# Patient Record
Sex: Female | Born: 1950
Health system: Southern US, Community
[De-identification: ages and names within clinical notes are randomized; demographics above are authoritative.]

## PROBLEM LIST (undated history)

## (undated) DIAGNOSIS — E039 Hypothyroidism, unspecified: Secondary | ICD-10-CM

## (undated) DIAGNOSIS — M722 Plantar fascial fibromatosis: Secondary | ICD-10-CM

## (undated) DIAGNOSIS — M7730 Calcaneal spur, unspecified foot: Secondary | ICD-10-CM

## (undated) DIAGNOSIS — J45909 Unspecified asthma, uncomplicated: Secondary | ICD-10-CM

## (undated) DIAGNOSIS — H269 Unspecified cataract: Secondary | ICD-10-CM

## (undated) DIAGNOSIS — M810 Age-related osteoporosis without current pathological fracture: Secondary | ICD-10-CM

## (undated) DIAGNOSIS — E785 Hyperlipidemia, unspecified: Secondary | ICD-10-CM

## (undated) DIAGNOSIS — M8580 Other specified disorders of bone density and structure, unspecified site: Secondary | ICD-10-CM

## (undated) DIAGNOSIS — L570 Actinic keratosis: Secondary | ICD-10-CM

## (undated) DIAGNOSIS — M84376A Stress fracture, unspecified foot, initial encounter for fracture: Secondary | ICD-10-CM

## (undated) HISTORY — DX: Plantar fascial fibromatosis: M72.2

## (undated) HISTORY — DX: Hypothyroidism, unspecified: E03.9

## (undated) HISTORY — PX: WISDOM TOOTH EXTRACTION: SHX21

## (undated) HISTORY — PX: COLONOSCOPY: SHX174

## (undated) HISTORY — DX: Unspecified cataract: H26.9

## (undated) HISTORY — DX: Actinic keratosis: L57.0

## (undated) HISTORY — DX: Hyperlipidemia, unspecified: E78.5

## (undated) HISTORY — DX: Stress fracture, unspecified foot, initial encounter for fracture: M84.376A

## (undated) HISTORY — DX: Calcaneal spur, unspecified foot: M77.30

## (undated) HISTORY — PX: FRACTURE SURGERY: SHX138

## (undated) HISTORY — DX: Other specified disorders of bone density and structure, unspecified site: M85.80

## (undated) HISTORY — PX: EYE SURGERY: SHX253

## (undated) HISTORY — DX: Age-related osteoporosis without current pathological fracture: M81.0

---

## 1999-01-07 ENCOUNTER — Other Ambulatory Visit: Admission: RE | Admit: 1999-01-07 | Discharge: 1999-01-07 | Payer: Self-pay | Admitting: Family Medicine

## 2001-04-23 ENCOUNTER — Other Ambulatory Visit: Admission: RE | Admit: 2001-04-23 | Discharge: 2001-04-23 | Payer: Self-pay | Admitting: Family Medicine

## 2003-02-17 ENCOUNTER — Other Ambulatory Visit: Admission: RE | Admit: 2003-02-17 | Discharge: 2003-02-17 | Payer: Self-pay | Admitting: Family Medicine

## 2003-03-06 LAB — FECAL OCCULT BLOOD, GUAIAC: Fecal Occult Blood: NEGATIVE

## 2004-02-07 ENCOUNTER — Ambulatory Visit: Payer: Self-pay | Admitting: Family Medicine

## 2004-04-05 ENCOUNTER — Ambulatory Visit: Payer: Self-pay | Admitting: Family Medicine

## 2004-05-29 ENCOUNTER — Ambulatory Visit: Payer: Self-pay | Admitting: Family Medicine

## 2004-07-11 ENCOUNTER — Ambulatory Visit: Payer: Self-pay | Admitting: Family Medicine

## 2005-01-28 ENCOUNTER — Other Ambulatory Visit: Admission: RE | Admit: 2005-01-28 | Discharge: 2005-01-28 | Payer: Self-pay | Admitting: Family Medicine

## 2005-01-28 ENCOUNTER — Encounter: Payer: Self-pay | Admitting: Family Medicine

## 2005-01-28 ENCOUNTER — Ambulatory Visit: Payer: Self-pay | Admitting: Family Medicine

## 2005-01-28 LAB — CONVERTED CEMR LAB: Pap Smear: NORMAL

## 2005-02-07 ENCOUNTER — Ambulatory Visit: Payer: Self-pay | Admitting: Family Medicine

## 2005-02-26 ENCOUNTER — Ambulatory Visit: Payer: Self-pay | Admitting: Family Medicine

## 2005-03-18 ENCOUNTER — Ambulatory Visit: Payer: Self-pay | Admitting: Family Medicine

## 2005-07-22 ENCOUNTER — Ambulatory Visit: Payer: Self-pay | Admitting: Family Medicine

## 2006-01-19 ENCOUNTER — Ambulatory Visit: Payer: Self-pay | Admitting: Family Medicine

## 2006-05-13 ENCOUNTER — Ambulatory Visit: Payer: Self-pay | Admitting: Family Medicine

## 2006-08-12 ENCOUNTER — Ambulatory Visit: Payer: Self-pay | Admitting: Family Medicine

## 2006-08-14 ENCOUNTER — Encounter (INDEPENDENT_AMBULATORY_CARE_PROVIDER_SITE_OTHER): Payer: Self-pay | Admitting: *Deleted

## 2007-01-12 ENCOUNTER — Ambulatory Visit: Payer: Self-pay | Admitting: Family Medicine

## 2007-01-12 DIAGNOSIS — E785 Hyperlipidemia, unspecified: Secondary | ICD-10-CM | POA: Insufficient documentation

## 2007-01-12 DIAGNOSIS — E039 Hypothyroidism, unspecified: Secondary | ICD-10-CM | POA: Insufficient documentation

## 2007-01-12 DIAGNOSIS — M7730 Calcaneal spur, unspecified foot: Secondary | ICD-10-CM | POA: Insufficient documentation

## 2007-01-14 ENCOUNTER — Encounter: Payer: Self-pay | Admitting: Family Medicine

## 2007-01-15 ENCOUNTER — Encounter (INDEPENDENT_AMBULATORY_CARE_PROVIDER_SITE_OTHER): Payer: Self-pay | Admitting: *Deleted

## 2007-01-18 ENCOUNTER — Telehealth: Payer: Self-pay | Admitting: Family Medicine

## 2007-01-29 ENCOUNTER — Ambulatory Visit: Payer: Self-pay | Admitting: Family Medicine

## 2007-01-29 ENCOUNTER — Other Ambulatory Visit: Admission: RE | Admit: 2007-01-29 | Discharge: 2007-01-29 | Payer: Self-pay | Admitting: Family Medicine

## 2007-01-29 ENCOUNTER — Encounter: Payer: Self-pay | Admitting: Family Medicine

## 2007-01-29 DIAGNOSIS — M8580 Other specified disorders of bone density and structure, unspecified site: Secondary | ICD-10-CM | POA: Insufficient documentation

## 2007-02-03 ENCOUNTER — Encounter (INDEPENDENT_AMBULATORY_CARE_PROVIDER_SITE_OTHER): Payer: Self-pay | Admitting: *Deleted

## 2007-02-03 LAB — CONVERTED CEMR LAB: Pap Smear: NORMAL

## 2007-02-05 ENCOUNTER — Ambulatory Visit: Payer: Self-pay | Admitting: Family Medicine

## 2007-02-09 LAB — CONVERTED CEMR LAB: Vit D, 1,25-Dihydroxy: 32 (ref 30–89)

## 2007-02-11 LAB — CONVERTED CEMR LAB
ALT: 17 units/L (ref 0–35)
AST: 25 units/L (ref 0–37)
Albumin: 3.8 g/dL (ref 3.5–5.2)
Alkaline Phosphatase: 83 units/L (ref 39–117)
BUN: 15 mg/dL (ref 6–23)
Basophils Absolute: 0 10*3/uL (ref 0.0–0.1)
Basophils Relative: 1.3 % — ABNORMAL HIGH (ref 0.0–1.0)
Bilirubin, Direct: 0.1 mg/dL (ref 0.0–0.3)
CO2: 32 meq/L (ref 19–32)
Calcium: 9.5 mg/dL (ref 8.4–10.5)
Chloride: 104 meq/L (ref 96–112)
Cholesterol: 240 mg/dL (ref 0–200)
Creatinine, Ser: 1 mg/dL (ref 0.4–1.2)
Direct LDL: 161.8 mg/dL
Eosinophils Absolute: 0 10*3/uL (ref 0.0–0.6)
Eosinophils Relative: 0.8 % (ref 0.0–5.0)
GFR calc Af Amer: 74 mL/min
GFR calc non Af Amer: 61 mL/min
Glucose, Bld: 96 mg/dL (ref 70–99)
HCT: 39.8 % (ref 36.0–46.0)
HDL: 60.1 mg/dL (ref >39.0)
Hemoglobin: 13.8 g/dL (ref 12.0–15.0)
Lymphocytes Relative: 43 % (ref 12.0–46.0)
MCHC: 34.6 g/dL (ref 30.0–36.0)
MCV: 92.1 fL (ref 78.0–100.0)
Monocytes Absolute: 0.4 10*3/uL (ref 0.2–0.7)
Monocytes Relative: 11.2 % — ABNORMAL HIGH (ref 3.0–11.0)
Neutro Abs: 1.5 10*3/uL (ref 1.4–7.7)
Neutrophils Relative %: 43.7 % (ref 43.0–77.0)
Platelets: 247 10*3/uL (ref 150–400)
Potassium: 4.4 meq/L (ref 3.5–5.1)
RBC: 4.32 M/uL (ref 3.87–5.11)
RDW: 12 % (ref 11.5–14.6)
Sodium: 141 meq/L (ref 135–145)
TSH: 2.78 microintl units/mL (ref 0.35–5.50)
Total Bilirubin: 1.3 mg/dL — ABNORMAL HIGH (ref 0.3–1.2)
Total CHOL/HDL Ratio: 4
Total Protein: 7.1 g/dL (ref 6.0–8.3)
Triglycerides: 58 mg/dL (ref 0–149)
VLDL: 12 mg/dL (ref 0–40)
WBC: 3.4 10*3/uL — ABNORMAL LOW (ref 4.5–10.5)

## 2007-03-16 ENCOUNTER — Ambulatory Visit: Payer: Self-pay | Admitting: Family Medicine

## 2007-03-18 LAB — CONVERTED CEMR LAB
Basophils Absolute: 0.2 10*3/uL — ABNORMAL HIGH (ref 0.0–0.1)
Basophils Relative: 3.3 % — ABNORMAL HIGH (ref 0.0–1.0)
Eosinophils Absolute: 0 10*3/uL (ref 0.0–0.6)
Eosinophils Relative: 0.8 % (ref 0.0–5.0)
HCT: 36.3 % (ref 36.0–46.0)
Hemoglobin: 12.6 g/dL (ref 12.0–15.0)
Lymphocytes Relative: 42.4 % (ref 12.0–46.0)
MCHC: 34.6 g/dL (ref 30.0–36.0)
MCV: 94.5 fL (ref 78.0–100.0)
Monocytes Absolute: 0.5 10*3/uL (ref 0.2–0.7)
Monocytes Relative: 10.2 % (ref 3.0–11.0)
Neutro Abs: 2 10*3/uL (ref 1.4–7.7)
Neutrophils Relative %: 43.3 % (ref 43.0–77.0)
Platelets: 226 10*3/uL (ref 150–400)
RBC: 3.84 M/uL — ABNORMAL LOW (ref 3.87–5.11)
RDW: 12.1 % (ref 11.5–14.6)
WBC: 4.6 10*3/uL (ref 4.5–10.5)

## 2007-08-25 ENCOUNTER — Telehealth: Payer: Self-pay | Admitting: Family Medicine

## 2007-09-08 ENCOUNTER — Ambulatory Visit: Payer: Self-pay | Admitting: Family Medicine

## 2007-09-08 ENCOUNTER — Encounter: Payer: Self-pay | Admitting: Family Medicine

## 2007-09-10 ENCOUNTER — Encounter (INDEPENDENT_AMBULATORY_CARE_PROVIDER_SITE_OTHER): Payer: Self-pay | Admitting: *Deleted

## 2008-10-27 ENCOUNTER — Encounter: Payer: Self-pay | Admitting: Family Medicine

## 2008-10-27 ENCOUNTER — Ambulatory Visit: Payer: Self-pay | Admitting: Family Medicine

## 2008-10-27 ENCOUNTER — Other Ambulatory Visit: Admission: RE | Admit: 2008-10-27 | Discharge: 2008-10-27 | Payer: Self-pay | Admitting: Family Medicine

## 2008-10-27 LAB — HM PAP SMEAR

## 2008-10-31 ENCOUNTER — Encounter (INDEPENDENT_AMBULATORY_CARE_PROVIDER_SITE_OTHER): Payer: Self-pay | Admitting: *Deleted

## 2008-10-31 LAB — CONVERTED CEMR LAB
ALT: 15 units/L (ref 0–35)
AST: 26 units/L (ref 0–37)
Albumin: 4.1 g/dL (ref 3.5–5.2)
Alkaline Phosphatase: 77 units/L (ref 39–117)
BUN: 9 mg/dL (ref 6–23)
Basophils Absolute: 0 10*3/uL (ref 0.0–0.1)
Basophils Relative: 0.3 % (ref 0.0–3.0)
Bilirubin, Direct: 0 mg/dL (ref 0.0–0.3)
CO2: 30 meq/L (ref 19–32)
Calcium: 9.2 mg/dL (ref 8.4–10.5)
Chloride: 102 meq/L (ref 96–112)
Cholesterol: 239 mg/dL — ABNORMAL HIGH (ref 0–200)
Creatinine, Ser: 1 mg/dL (ref 0.4–1.2)
Direct LDL: 153.6 mg/dL
Eosinophils Absolute: 0 10*3/uL (ref 0.0–0.7)
Eosinophils Relative: 0.5 % (ref 0.0–5.0)
GFR calc non Af Amer: 60.92 mL/min (ref >60)
Glucose, Bld: 71 mg/dL (ref 70–99)
HCT: 40 % (ref 36.0–46.0)
HDL: 67.2 mg/dL (ref >39.00)
Hemoglobin: 13.3 g/dL (ref 12.0–15.0)
Lymphocytes Relative: 30.9 % (ref 12.0–46.0)
Lymphs Abs: 1.4 10*3/uL (ref 0.7–4.0)
MCHC: 33.3 g/dL (ref 30.0–36.0)
MCV: 96.8 fL (ref 78.0–100.0)
Monocytes Absolute: 0.4 10*3/uL (ref 0.1–1.0)
Monocytes Relative: 9.5 % (ref 3.0–12.0)
Neutro Abs: 2.6 10*3/uL (ref 1.4–7.7)
Neutrophils Relative %: 58.8 % (ref 43.0–77.0)
Platelets: 262 10*3/uL (ref 150.0–400.0)
Potassium: 3.9 meq/L (ref 3.5–5.1)
RBC: 4.13 M/uL (ref 3.87–5.11)
RDW: 11.9 % (ref 11.5–14.6)
Sodium: 138 meq/L (ref 135–145)
TSH: 1.63 microintl units/mL (ref 0.35–5.50)
Total Bilirubin: 1.9 mg/dL — ABNORMAL HIGH (ref 0.3–1.2)
Total CHOL/HDL Ratio: 4
Total Protein: 7.3 g/dL (ref 6.0–8.3)
Triglycerides: 80 mg/dL (ref 0.0–149.0)
VLDL: 16 mg/dL (ref 0.0–40.0)
WBC: 4.4 10*3/uL — ABNORMAL LOW (ref 4.5–10.5)

## 2008-11-01 ENCOUNTER — Ambulatory Visit: Payer: Self-pay | Admitting: Family Medicine

## 2008-11-01 ENCOUNTER — Encounter: Payer: Self-pay | Admitting: Family Medicine

## 2008-11-02 ENCOUNTER — Encounter (INDEPENDENT_AMBULATORY_CARE_PROVIDER_SITE_OTHER): Payer: Self-pay | Admitting: *Deleted

## 2008-11-03 ENCOUNTER — Encounter (INDEPENDENT_AMBULATORY_CARE_PROVIDER_SITE_OTHER): Payer: Self-pay | Admitting: *Deleted

## 2009-02-07 ENCOUNTER — Encounter: Payer: Self-pay | Admitting: Family Medicine

## 2009-10-22 ENCOUNTER — Telehealth: Payer: Self-pay | Admitting: Family Medicine

## 2009-11-14 ENCOUNTER — Encounter: Payer: Self-pay | Admitting: Family Medicine

## 2009-11-14 ENCOUNTER — Ambulatory Visit: Payer: Self-pay | Admitting: Family Medicine

## 2009-11-14 LAB — HM MAMMOGRAPHY: HM Mammogram: NORMAL

## 2009-11-16 ENCOUNTER — Encounter: Payer: Self-pay | Admitting: Family Medicine

## 2010-01-22 ENCOUNTER — Telehealth: Payer: Self-pay | Admitting: Family Medicine

## 2010-04-02 ENCOUNTER — Encounter: Payer: Self-pay | Admitting: Family Medicine

## 2010-04-02 ENCOUNTER — Encounter: Payer: Self-pay | Admitting: Gastroenterology

## 2010-04-02 ENCOUNTER — Ambulatory Visit: Payer: Self-pay | Admitting: Family Medicine

## 2010-04-10 LAB — CONVERTED CEMR LAB
ALT: 14 U/L
AST: 22 U/L
Albumin: 3.9 g/dL
Alkaline Phosphatase: 87 U/L
BUN: 11 mg/dL
Basophils Absolute: 0.1 10*3/uL
Basophils Relative: 1.1 %
Bilirubin, Direct: 0.1 mg/dL
CO2: 29 meq/L
Calcium: 9.6 mg/dL
Chloride: 103 meq/L
Cholesterol: 259 mg/dL — ABNORMAL HIGH
Creatinine, Ser: 0.8 mg/dL
Direct LDL: 177.6 mg/dL
Eosinophils Absolute: 0.1 10*3/uL
Eosinophils Relative: 1.1 %
GFR calc non Af Amer: 75.15 mL/min
Glucose, Bld: 89 mg/dL
HCT: 43.3 %
HDL: 59.4 mg/dL
Hemoglobin: 14.4 g/dL
Lymphocytes Relative: 32.2 %
Lymphs Abs: 1.5 10*3/uL
MCHC: 33.3 g/dL
MCV: 95.3 fL
Monocytes Absolute: 0.4 10*3/uL
Monocytes Relative: 8.8 %
Neutro Abs: 2.7 10*3/uL
Neutrophils Relative %: 56.8 %
Platelets: 262 10*3/uL
Potassium: 4.3 meq/L
RBC: 4.54 M/uL
RDW: 12.2 %
Sodium: 140 meq/L
TSH: 1.4 u[IU]/mL
Total Bilirubin: 0.9 mg/dL
Total CHOL/HDL Ratio: 4
Total Protein: 7 g/dL
Triglycerides: 100 mg/dL
VLDL: 20 mg/dL
Vit D, 25-Hydroxy: 42 ng/mL
WBC: 4.8 10*3/uL

## 2010-05-02 ENCOUNTER — Encounter (INDEPENDENT_AMBULATORY_CARE_PROVIDER_SITE_OTHER): Payer: Self-pay | Admitting: *Deleted

## 2010-05-03 ENCOUNTER — Ambulatory Visit
Admission: RE | Admit: 2010-05-03 | Discharge: 2010-05-03 | Payer: Self-pay | Source: Home / Self Care | Attending: Gastroenterology | Admitting: Gastroenterology

## 2010-05-07 NOTE — Progress Notes (Signed)
  Phone Note Call from Patient   Caller: Patient Summary of Call: Patient needs her MmG ordered at [facility] wants an afternoon appt. Pls call her back at [phone]. Initial call taken by: Carlton Adam,  October 22, 2009 10:48 AM  Follow-up for Phone Call        will do ref for [facility] Follow-up by: Judith Part MD,  October 22, 2009 12:32 PM

## 2010-05-07 NOTE — Miscellaneous (Signed)
Summary: Mammgram update   Clinical Lists Changes  Observations: Added new observation of MAMMOGRAM: Normal (11/14/2009 15:03)

## 2010-05-07 NOTE — Progress Notes (Signed)
Summary: re: thyroid med   Phone Note Call from Patient Call back at [phone]   Caller: Patient Call For: Judith Part MD Summary of Call: Patient is about to run out of her thyroid medication and was told that she would need to make an appt before any more refills. She prefers to only have cpx done every 2 years so she doesn't want an office visit.  She is asking if she could just get labs done and get a refill on the medicaion for now. She says that it is tough right now and doesn't want to pay the copay. Please advise.  Initial call taken by: Melody Comas,  January 22, 2010 4:48 PM  Follow-up for Phone Call        unfortunately -- I do need to see her once a year to care for her hypothyroidism (visit and labs) can do full physical only as often as she wants  can refil her thyroid med for 2 mo while she thinks about it  px written on EMR for call in I understand the financial issues -- and for that reason if she wants to change to another practice - I would understand   Follow-up by: Judith Part MD,  January 22, 2010 8:39 PM  Additional Follow-up for Phone Call Additional follow up Details #1::        Left message for patient to call back. Have not called in med because I need to confirm a pharmacy.Lewanda Rife LPN  January 23, 2010 8:12 AM   Left message for patient to call back. Still need name of pharmacy med to be called to.Lewanda Rife LPN  January 23, 2010 11:06 AM     Additional Follow-up for Phone Call Additional follow up Details #2::    Advised pt, appt made for 04/02/10 for physical, medicine sent to pharmacy. Follow-up by: Lowella Petties CMA,  January 23, 2010 11:24 AM  Prescriptions: LEVOXYL 88 MCG  TABS (LEVOTHYROXINE SODIUM) one by mouth once daily Brand medically necessary #90 x 0   Entered by:   Lowella Petties CMA   Authorized by:   Judith Part MD   Signed by:   Lowella Petties CMA on 01/23/2010   Method used:   Electronically to        [facility] [address] (retail)       [address], [address]       [place], [place]  [address]       Ph: [phone]       Fax: [phone]   RxID:   [id number] LEVOXYL 88 MCG  TABS (LEVOTHYROXINE SODIUM) one by mouth once daily Brand medically necessary #90 x 0   Entered and Authorized by:   Judith Part MD   Signed by:   Lewanda Rife LPN on 34/74/2595   Method used:   Telephoned to ...       [facility] [address] (retail)       [address], [address]       [place], [place]  [address]       Ph: [phone]       Fax: [phone]   RxID:   [id number]

## 2010-05-09 NOTE — Miscellaneous (Signed)
Summary: [facility] Previsit/prep  Clinical Lists Changes  Medications: Added new medication of MOVIPREP 100 GM  SOLR (PEG-KCL-NACL-NASULF-NA ASC-C) As per prep instructions. - Signed Rx of MOVIPREP 100 GM  SOLR (PEG-KCL-NACL-NASULF-NA ASC-C) As per prep instructions.;  #1 x 0;  Signed;  Entered by: Wyona Almas RN;  Authorized by: Rachael Fee MD;  Method used: Electronically to [facility] [address], [place], [place], [place]  [address], Ph: [phone], Fax: [phone] Observations: Added new observation of ALLERGY REV: Done (05/03/2010 16:31)    Prescriptions: MOVIPREP 100 GM  SOLR (PEG-KCL-NACL-NASULF-NA ASC-C) As per prep instructions.  #1 x 0   Entered by:   Wyona Almas RN   Authorized by:   Rachael Fee MD   Signed by:   Wyona Almas RN on 05/03/2010   Method used:   Electronically to        [facility] [address] (retail)       [address], [address]       [place], [place]  [address]       Ph: [phone]       Fax: [phone]   RxID:   [id number]

## 2010-05-09 NOTE — Letter (Signed)
Summary: Pre Visit Letter Revised  [facility]  [address] [place], [place] [address]   Phone: [phone]  Fax: [phone]        04/02/2010 MRN: [id number]  Katrina [name] [address] [place], [place]  [address]             Procedure Date:  05-17-2010 9am            Direct Colon - Dr Russella Dar      Welcome to the Gastroenterology Division at [facility].    You are scheduled to see a nurse for your pre-procedure visit on 05-03-10 at 4:30pm on the 3rd floor at [facility], [address]  We ask that you try to arrive at our office 15 minutes prior to your appointment time to allow for check-in.  Please take a minute to review the attached form.  If you answer "Yes" to one or more of the questions on the first page, we ask that you call the person listed at your earliest opportunity.  If you answer "No" to all of the questions, please complete the rest of the form and bring it to your appointment.    Your nurse visit will consist of discussing your medical and surgical history, your immediate family medical history, and your medications.   If you are unable to list all of your medications on the form, please bring the medication bottles to your appointment and we will list them.  We will need to be aware of both prescribed and over the counter drugs.  We will need to know exact dosage information as well.    Please be prepared to read and sign documents such as consent forms, a financial agreement, and acknowledgement forms.  If necessary, and with your consent, a friend or relative is welcome to sit-in on the nurse visit with you.  Please bring your insurance card so that we may make a copy of it.  If your insurance requires a referral to see a specialist, please bring your referral form from your primary care physician.  No co-pay is required for this nurse visit.     If you cannot keep your appointment, please call [phone] to cancel or reschedule prior  to your appointment date.  This allows [place] the opportunity to schedule an appointment for another patient in need of care.    Thank you for choosing [facility] for your medical needs.  We appreciate the opportunity to care for you.  Please visit [place] at our website  to learn more about our practice.  Sincerely, The Gastroenterology Division

## 2010-05-09 NOTE — Assessment & Plan Note (Signed)
Summary: CPX   Vital Signs:  Patient profile:   60 year old female Height:      66.5 inches Weight:      157.25 pounds BMI:     25.09 Temp:     98 degrees F oral Pulse rate:   64 / minute Pulse rhythm:   regular BP sitting:   118 / 76  (left arm) Cuff size:   regular  Vitals Entered By: Lewanda Rife LPN (April 02, 2010 10:45 AM) CC: CPX LMP 4-5 yrs ago   History of Present Illness: here for wellness exam and to review chronic med problems had a good holiday   feeling fine no new medical issues  working a lot -- lots of stress - that equals some fatigue  is thankful to have a job   husband finally got a job    wt is up 5 lb  bp good at 118/76  lipids due  Last Lipid ProfileCholesterol: 239 (11-21-2008 11:16:46 AM)HDL:  67.20 (November 21, 2008 11:16:46 AM)LDL:  DEL (02/05/2007 9:08:00 AM)Triglycerides:  Last Liver profileSGOT:  26 (2008/11/21 11:16:46 AM)SPGT:  15 (2008-11-21 11:16:46 AM)T. Bili:  1.9 (11/21/08 11:16:46 AM)Alk Phos:  77 (Nov 21, 2008 11:16:46 AM)   hypothyroid-- is fatigued - but no other symptoms  no skin or hair changes   osteopenia on dexa 11/10 ca and D -- is good about that (work days good --other days sporatic)  last vit D level in the 30s is taking 1800 international units vit D daily  is doing core class for exercise     pap nl 7/10 last 3 paps have been neg no symptoms  no new partners  no hx of abn paps    mam 8/11 self exam - no lumps or changes  Td 03  wants to get screening colonosc   Allergies (verified): 1)  ! Codeine  Past History:  Past Surgical History: Last updated: 01/12/2007 childbirth x 4  Family History: Last updated: 11/21/08 Father: myelodysplasia, died  Mother: HTN, thyroid problems Siblings: 3 brothers, 1 sister GM gallbladder ca GF lung ca GF colon ca GM OP  Social History: Last updated: 01/12/2007 Marital Status: Married Children: 4 Occupation: [employer]  Risk  Factors: Smoking Status: never (01/12/2007)  Past Medical History: Hyperlipidemia Hypothyroidism osteopenia/stress fx in foot plantar fasciitis with heel spur  Review of Systems General:  Complains of fatigue; denies loss of appetite. Eyes:  Denies blurring and eye irritation. CV:  Denies chest pain or discomfort, lightheadness, and palpitations. Resp:  Denies cough, shortness of breath, and wheezing. GI:  Denies abdominal pain, bloody stools, change in bowel habits, indigestion, and nausea. GU:  Denies abnormal vaginal bleeding, discharge, dysuria, and urinary frequency. MS:  Denies joint pain, joint redness, and joint swelling. Derm:  Denies itching, lesion(s), poor wound healing, and rash. Neuro:  Denies headaches, numbness, and poor balance. Psych:  mood is ok . Endo:  Denies cold intolerance, excessive thirst, excessive urination, and heat intolerance. Heme:  Denies abnormal bruising and bleeding.  Physical Exam  General:  Well-developed,well-nourished,in no acute distress; alert,appropriate and cooperative throughout examination Head:  normocephalic, atraumatic, and no abnormalities observed.   Eyes:  vision grossly intact, pupils equal, pupils round, and pupils reactive to light.  no conjunctival pallor, injection or icterus  Ears:  R ear normal and L ear normal.   Nose:  no nasal discharge.   Mouth:  pharynx pink and moist.   Neck:  supple with full rom and no masses or  thyromegally, no JVD or carotid bruit  Chest Wall:  No deformities, masses, or tenderness noted. Breasts:  No mass, nodules, thickening, tenderness, bulging, retraction, inflamation, nipple discharge or skin changes noted.   Lungs:  Normal respiratory effort, chest expands symmetrically. Lungs are clear to auscultation, no crackles or wheezes. Heart:  Normal rate and regular rhythm. S1 and S2 normal without gallop, murmur, click, rub or other extra sounds. Abdomen:  Bowel sounds positive,abdomen soft and  non-tender without masses, organomegaly or hernias noted. no renal bruits  Msk:  No deformity or scoliosis noted of thoracic or lumbar spine.  no acute joint changes  Pulses:  R and L carotid,radial,femoral,dorsalis pedis and posterior tibial pulses are full and equal bilaterally Extremities:  No clubbing, cyanosis, edema, or deformity noted with normal full range of motion of all joints.   Neurologic:  sensation intact to light touch, gait normal, and DTRs symmetrical and normal.   Skin:  lentigos/ fair complexion no rash Cervical Nodes:  No lymphadenopathy noted Axillary Nodes:  No palpable lymphadenopathy Inguinal Nodes:  No significant adenopathy Psych:  normal affect, talkative and pleasant    Impression & Recommendations:  Problem # 1:  HEALTH MAINTENANCE EXAM (ICD-V70.0) Assessment Comment Only reviewed health habits including diet, exercise and skin cancer prevention reviewed health maintenance list and family history wellness labs today Orders: Venipuncture (98119) TLB-Lipid Panel (80061-LIPID) TLB-BMP (Basic Metabolic Panel-BMET) (80048-METABOL) TLB-CBC Platelet - w/Differential (85025-CBCD) TLB-Hepatic/Liver Function Pnl (80076-HEPATIC) TLB-TSH (Thyroid Stimulating Hormone) (84443-TSH) T-Vitamin D (25-Hydroxy) (14782-95621)  Problem # 2:  OSTEOPENIA (ICD-733.90) Assessment: Unchanged rev last dexa emph imp of ca and D exp imp D level on current dose  exercise -- is overall good  dexa in a year Her updated medication list for this problem includes:    Vitamin D 1000 Unit Tabs (Cholecalciferol) .Marland Kitchen... Take 1 tablet by mouth once a day    Calcium Carbonate-vitamin D 600-400 Mg-unit Tabs (Calcium carbonate-vitamin d) ..... One tablet by mouth twice a day  Orders: Venipuncture (30865) TLB-Lipid Panel (80061-LIPID) TLB-BMP (Basic Metabolic Panel-BMET) (80048-METABOL) TLB-CBC Platelet - w/Differential (85025-CBCD) TLB-Hepatic/Liver Function Pnl (80076-HEPATIC) TLB-TSH  (Thyroid Stimulating Hormone) (84443-TSH) T-Vitamin D (25-Hydroxy) (78469-62952) Specimen Handling (84132)  Problem # 3:  HYPOTHYROIDISM (ICD-244.9) Assessment: Unchanged overall no clinical change but fatigue labs today and update no change on exam Her updated medication list for this problem includes:    Levoxyl 88 Mcg Tabs (Levothyroxine sodium) ..... One by mouth once daily  Orders: Venipuncture (44010) TLB-Lipid Panel (80061-LIPID) TLB-BMP (Basic Metabolic Panel-BMET) (80048-METABOL) TLB-CBC Platelet - w/Differential (85025-CBCD) TLB-Hepatic/Liver Function Pnl (80076-HEPATIC) TLB-TSH (Thyroid Stimulating Hormone) (84443-TSH) T-Vitamin D (25-Hydroxy) (27253-66440)  Problem # 4:  HYPERLIPIDEMIA (ICD-272.4) Assessment: Unchanged diet improved -- disc low sat fat diet  lab today and update Orders: Venipuncture (34742) TLB-Lipid Panel (80061-LIPID) TLB-BMP (Basic Metabolic Panel-BMET) (80048-METABOL) TLB-CBC Platelet - w/Differential (85025-CBCD) TLB-Hepatic/Liver Function Pnl (80076-HEPATIC) TLB-TSH (Thyroid Stimulating Hormone) (84443-TSH) T-Vitamin D (25-Hydroxy) (59563-87564)  Labs Reviewed: SGOT: 26 (10/27/2008)   SGPT: 15 (10/27/2008)   HDL:67.20 (10/27/2008), 60.1 (02/05/2007)  LDL:DEL (02/05/2007)  Chol:239 (10/27/2008), 240 (02/05/2007)  Trig:80.0 (10/27/2008), 58 (02/05/2007)  Complete Medication List: 1)  Levoxyl 88 Mcg Tabs (Levothyroxine sodium) .... One by mouth once daily 2)  Multivitamins Tabs (Multiple vitamin) .... Take 1 tablet by mouth once a day 3)  Vitamin B13  .... Otc as directed. 4)  Vitamin D 1000 Unit Tabs (Cholecalciferol) .... Take 1 tablet by mouth once a day 5)  Calcium Carbonate-vitamin D 600-400 Mg-unit Tabs (Calcium  carbonate-vitamin d) .... One tablet by mouth twice a day  Other Orders: Gastroenterology Referral (GI)  Patient Instructions: 1)  labs today  2)  we will refer you for colonoscopy at check out  3)  keep working on  healthy diet and exercise 4)  be good about your calcium and D Prescriptions: LEVOXYL 88 MCG  TABS (LEVOTHYROXINE SODIUM) one by mouth once daily Brand medically necessary #90 x 3   Entered and Authorized by:   Judith Part MD   Signed by:   Judith Part MD on 04/02/2010   Method used:   Print then Give to Patient   RxID:   [id number]    Orders Added: 1)  Venipuncture [14782] 2)  TLB-Lipid Panel [80061-LIPID] 3)  TLB-BMP (Basic Metabolic Panel-BMET) [80048-METABOL] 4)  TLB-CBC Platelet - w/Differential [85025-CBCD] 5)  TLB-Hepatic/Liver Function Pnl [80076-HEPATIC] 6)  TLB-TSH (Thyroid Stimulating Hormone) [84443-TSH] 7)  T-Vitamin D (25-Hydroxy) [95621-30865] 8)  Specimen Handling [99000] 9)  Gastroenterology Referral [GI] 10)  Est. Patient 40-64 years [78469]    Current Allergies (reviewed today): ! CODEINE

## 2010-05-09 NOTE — Letter (Signed)
Summary: [facility] Instructions  [facility]  [address] [place], [place] [address]   Phone: [phone]  Fax: [phone]       Katrina [name]    07-31-1950    MRN: [id number]        Procedure Day Dorna Bloom:  Farrell Ours  05/17/10     Arrival Time:  8:00AM     Procedure Time:  9:00AM     [facility] of Procedure:                    [facility] ([facility]th [facility]                      PREPARATION FOR COLONOSCOPY WITH MOVIPREP   Starting 5 days prior to your procedure 05/12/10 do not eat nuts, seeds, popcorn, corn, beans, peas,  salads, or any raw vegetables.  Do not take any fiber supplements (e.g. Metamucil, Citrucel, and Benefiber).  THE DAY BEFORE YOUR PROCEDURE         DATE: 05/16/10  DAY: THURSDAY  1.  Drink clear liquids the entire day-NO SOLID FOOD  2.  Do not drink anything colored red or purple.  Avoid juices with pulp.  No orange juice.  3.  Drink at least 64 oz. (8 glasses) of fluid/clear liquids during the day to prevent dehydration and help the prep work efficiently.  CLEAR LIQUIDS INCLUDE: Water Jello Ice Popsicles Tea (sugar ok, no milk/cream) Powdered fruit flavored drinks Coffee (sugar ok, no milk/cream) Gatorade Juice: apple, white grape, white cranberry  Lemonade Clear bullion, consomm, broth Carbonated beverages (any kind) Strained chicken noodle soup Hard Candy                             4.  In the morning, mix first dose of MoviPrep solution:    Empty 1 Pouch A and 1 Pouch B into the disposable container    Add lukewarm drinking water to the top line of the container. Mix to dissolve    Refrigerate (mixed solution should be used within 24 hrs)  5.  Begin drinking the prep at 5:00 p.m. The MoviPrep container is divided by 4 marks.   Every 15 minutes drink the solution down to the next mark (approximately 8 oz) until the full liter is complete.   6.  Follow completed prep with 16 oz of clear liquid of your choice (Nothing red  or purple).  Continue to drink clear liquids until bedtime.  7.  Before going to bed, mix second dose of MoviPrep solution:    Empty 1 Pouch A and 1 Pouch B into the disposable container    Add lukewarm drinking water to the top line of the container. Mix to dissolve    Refrigerate  THE DAY OF YOUR PROCEDURE      DATE: 05/17/10  DAY: FRIDAY  Beginning at 4:00AM (5 hours before procedure):         1. Every 15 minutes, drink the solution down to the next mark (approx 8 oz) until the full liter is complete.  2. Follow completed prep with 16 oz. of clear liquid of your choice.    3. You may drink clear liquids until 7:00AM (2 HOURS BEFORE PROCEDURE).   MEDICATION INSTRUCTIONS  Unless otherwise instructed, you should take regular prescription medications with a small sip of water   as early as possible the morning of your  procedure.        OTHER INSTRUCTIONS  You will need a responsible adult at least 58 years of age to accompany you and drive you home.   This person must remain in the waiting room during your procedure.  Wear loose fitting clothing that is easily removed.  Leave jewelry and other valuables at home.  However, you may wish to bring a book to read or  an iPod/MP3 player to listen to music as you wait for your procedure to start.  Remove all body piercing jewelry and leave at home.  Total time from sign-in until discharge is approximately 2-3 hours.  You should go home directly after your procedure and rest.  You can resume normal activities the  day after your procedure.  The day of your procedure you should not:   Drive   Make legal decisions   Operate machinery   Drink alcohol   Return to work  You will receive specific instructions about eating, activities and medications before you leave.    The above instructions have been reviewed and explained to me by   Wyona Almas RN  May 03, 2010 4:56 PM     I fully understand and can  verbalize these instructions _____________________________ Date _________

## 2010-05-17 ENCOUNTER — Other Ambulatory Visit: Payer: Self-pay | Admitting: Gastroenterology

## 2010-12-30 ENCOUNTER — Ambulatory Visit: Payer: Self-pay | Admitting: Family Medicine

## 2010-12-31 ENCOUNTER — Encounter: Payer: Self-pay | Admitting: Family Medicine

## 2011-01-02 ENCOUNTER — Encounter: Payer: Self-pay | Admitting: *Deleted

## 2011-01-03 ENCOUNTER — Telehealth: Payer: Self-pay | Admitting: Family Medicine

## 2011-01-03 DIAGNOSIS — Z1231 Encounter for screening mammogram for malignant neoplasm of breast: Secondary | ICD-10-CM

## 2011-01-03 NOTE — Telephone Encounter (Signed)
According to chart she had her mam on 9/24? -- does she need a retroactive order for that one?

## 2011-01-03 NOTE — Telephone Encounter (Signed)
Pt need order for her yearly mammogram to [facility].... Katrina [name]

## 2011-01-09 DIAGNOSIS — Z1231 Encounter for screening mammogram for malignant neoplasm of breast: Secondary | ICD-10-CM | POA: Insufficient documentation

## 2011-01-09 NOTE — Telephone Encounter (Signed)
Pt does need an order for mammogram to go to [facility] outpatient center.  She had the mammogram on 9/24.

## 2011-01-09 NOTE — Telephone Encounter (Signed)
Will do order for mam already done 9/24

## 2011-01-09 NOTE — Telephone Encounter (Signed)
Addended by: Roxy Manns A on: 01/09/2011 11:16 AM   Modules accepted: Orders

## 2011-04-24 ENCOUNTER — Other Ambulatory Visit: Payer: Self-pay

## 2011-04-24 MED ORDER — LEVOTHYROXINE SODIUM 88 MCG PO TABS: 88.0000 ug | ORAL_TABLET | Freq: Every day | ORAL | Status: DC

## 2011-04-24 NOTE — Telephone Encounter (Signed)
I don't see a message from Monday?- I may not be looking in the right place... sched f/u with me and I will refil until then Px written for call in  Or send elect-- I [facility] for [facility] pharmacy for some reason thanks

## 2011-04-24 NOTE — Telephone Encounter (Signed)
Patient notified as instructed by telephone. Pt will call back for appt. Med sent electronically to [facility] [address]

## 2011-04-24 NOTE — Telephone Encounter (Signed)
Pt said she called Mon and spoke with triage nurse and requested refill for Levoxyl . Pt wanted to know if she needed to come in for lab test or appt with Dr Milinda Antis or if med could be sent to [facility] [address] Pt said she has 1 week of Levoxyl left. Pt can be reached at [phone] cell or [phone] home.

## 2011-04-28 ENCOUNTER — Other Ambulatory Visit: Payer: Self-pay | Admitting: Family Medicine

## 2011-04-29 ENCOUNTER — Other Ambulatory Visit: Payer: Self-pay | Admitting: *Deleted

## 2011-04-29 MED ORDER — LEVOTHYROXINE SODIUM 88 MCG PO TABS: 88.0000 ug | ORAL_TABLET | Freq: Every day | ORAL | Status: DC

## 2011-04-29 NOTE — Telephone Encounter (Signed)
[facility] [address] request refill Levoxyl 88 mcg. Was sent electronically 04/24/11 #30 x 1 to [facility] [address]

## 2011-04-30 ENCOUNTER — Other Ambulatory Visit: Payer: Self-pay | Admitting: Family Medicine

## 2011-04-30 NOTE — Telephone Encounter (Signed)
Patient called and stated that she has called our office several times to get her Rx for Levoxyl refilled, she has also been to [facility] pharmacy twice and her Rx was never called in.  I called [facility] and spoke to the pharmacist can gave Rx refill verbally.

## 2011-08-04 ENCOUNTER — Telehealth: Payer: Self-pay

## 2011-08-04 MED ORDER — LEVOTHYROXINE SODIUM 88 MCG PO TABS: 88.0000 ug | ORAL_TABLET | Freq: Every day | ORAL | Status: DC

## 2011-08-04 NOTE — Telephone Encounter (Signed)
Patient notified by telephone that rx was sent electronically to the pharmacy.

## 2011-08-04 NOTE — Telephone Encounter (Signed)
Ok - will send electronically

## 2011-08-04 NOTE — Telephone Encounter (Signed)
Pt request generic substitution for Levoxyl for 30 day prescription sent to [facility] [address] (Levoxyl on recall). Pt will discuss with Dr Milinda Antis at her CPX 08/15/11 about 90 day rx. Pt is out of medication.Pt can be reached [phone].

## 2011-08-15 ENCOUNTER — Encounter: Payer: Self-pay | Admitting: Family Medicine

## 2011-08-15 ENCOUNTER — Other Ambulatory Visit (HOSPITAL_COMMUNITY)
Admission: RE | Admit: 2011-08-15 | Discharge: 2011-08-15 | Disposition: A | Discharge status: Home / Self Care | Payer: BC Managed Care – PPO | Source: Ambulatory Visit | Attending: Family Medicine | Admitting: Family Medicine

## 2011-08-15 ENCOUNTER — Ambulatory Visit (INDEPENDENT_AMBULATORY_CARE_PROVIDER_SITE_OTHER): Payer: BC Managed Care – PPO | Admitting: Family Medicine

## 2011-08-15 VITALS — BP 108/64 | HR 60 | Temp 97.7°F | Ht 67.0 in | Wt 160.0 lb

## 2011-08-15 DIAGNOSIS — Z01419 Encounter for gynecological examination (general) (routine) without abnormal findings: Secondary | ICD-10-CM | POA: Insufficient documentation

## 2011-08-15 DIAGNOSIS — Z23 Encounter for immunization: Secondary | ICD-10-CM

## 2011-08-15 DIAGNOSIS — Z780 Asymptomatic menopausal state: Secondary | ICD-10-CM

## 2011-08-15 DIAGNOSIS — Z0000 Encounter for general adult medical examination without abnormal findings: Secondary | ICD-10-CM

## 2011-08-15 DIAGNOSIS — Z1159 Encounter for screening for other viral diseases: Secondary | ICD-10-CM | POA: Insufficient documentation

## 2011-08-15 DIAGNOSIS — Z1231 Encounter for screening mammogram for malignant neoplasm of breast: Secondary | ICD-10-CM

## 2011-08-15 DIAGNOSIS — M899 Disorder of bone, unspecified: Secondary | ICD-10-CM

## 2011-08-15 DIAGNOSIS — E785 Hyperlipidemia, unspecified: Secondary | ICD-10-CM

## 2011-08-15 DIAGNOSIS — E039 Hypothyroidism, unspecified: Secondary | ICD-10-CM

## 2011-08-15 DIAGNOSIS — M949 Disorder of cartilage, unspecified: Secondary | ICD-10-CM

## 2011-08-15 LAB — COMPREHENSIVE METABOLIC PANEL
ALT: 18 U/L (ref 0–35)
AST: 28 U/L (ref 0–37)
Albumin: 3.9 g/dL (ref 3.5–5.2)
Alkaline Phosphatase: 82 U/L (ref 39–117)
BUN: 11 mg/dL (ref 6–23)
CO2: 29 mEq/L (ref 19–32)
Calcium: 9.4 mg/dL (ref 8.4–10.5)
Chloride: 102 mEq/L (ref 96–112)
Creatinine, Ser: 0.9 mg/dL (ref 0.4–1.2)
GFR: 72.76 mL/min (ref >60.00)
Glucose, Bld: 90 mg/dL (ref 70–99)
Potassium: 4.1 mEq/L (ref 3.5–5.1)
Sodium: 140 mEq/L (ref 135–145)
Total Bilirubin: 1.1 mg/dL (ref 0.3–1.2)
Total Protein: 7.3 g/dL (ref 6.0–8.3)

## 2011-08-15 LAB — CBC WITH DIFFERENTIAL/PLATELET
Basophils Absolute: 0 10*3/uL (ref 0.0–0.1)
Basophils Relative: 1 % (ref 0.0–3.0)
Eosinophils Absolute: 0 10*3/uL (ref 0.0–0.7)
Eosinophils Relative: 0.6 % (ref 0.0–5.0)
HCT: 43 % (ref 36.0–46.0)
Hemoglobin: 14.2 g/dL (ref 12.0–15.0)
Lymphocytes Relative: 30.2 % (ref 12.0–46.0)
Lymphs Abs: 1.5 10*3/uL (ref 0.7–4.0)
MCHC: 33.1 g/dL (ref 30.0–36.0)
MCV: 94.8 fl (ref 78.0–100.0)
Monocytes Absolute: 0.5 10*3/uL (ref 0.1–1.0)
Monocytes Relative: 10.8 % (ref 3.0–12.0)
Neutro Abs: 2.9 10*3/uL (ref 1.4–7.7)
Neutrophils Relative %: 57.4 % (ref 43.0–77.0)
Platelets: 245 10*3/uL (ref 150.0–400.0)
RBC: 4.54 Mil/uL (ref 3.87–5.11)
RDW: 12.7 % (ref 11.5–14.6)
WBC: 5.1 10*3/uL (ref 4.5–10.5)

## 2011-08-15 LAB — LIPID PANEL
Cholesterol: 258 mg/dL — ABNORMAL HIGH (ref 0–200)
HDL: 64.4 mg/dL (ref >39.00)
Total CHOL/HDL Ratio: 4
Triglycerides: 95 mg/dL (ref 0.0–149.0)
VLDL: 19 mg/dL (ref 0.0–40.0)

## 2011-08-15 LAB — LDL CHOLESTEROL, DIRECT: Direct LDL: 181.2 mg/dL

## 2011-08-15 LAB — TSH: TSH: 1.24 u[IU]/mL (ref 0.35–5.50)

## 2011-08-15 MED ORDER — LEVOTHYROXINE SODIUM 88 MCG PO TABS: 88.0000 ug | ORAL_TABLET | Freq: Every day | ORAL | Status: DC

## 2011-08-15 NOTE — Assessment & Plan Note (Signed)
sched dexa -due

## 2011-08-15 NOTE — Patient Instructions (Signed)
Be sure to schedule your own colonoscopy and mammogram  Tdap vaccine today Pap today Labs today We will schedule bone density test at check out

## 2011-08-15 NOTE — Assessment & Plan Note (Signed)
Lab today Rev low sat fat diet and goals for chol control

## 2011-08-15 NOTE — Progress Notes (Signed)
Subjective:    Patient ID: Katrina [name], female    DOB: Aug 11, 1950, 61 y.o.   MRN: [id number]  HPI Here for health maintenance exam and to review chronic medical problems   She is feeling great - no problems  Became a gmother this week  Gets to help a lot    bp good  Wt is up 3 lb with bmi of 25  Pap 7/10 Due for that No problems , no bleeding   mammo 9/12- will get a reminder on that  Self exam-no lumps or changes   Colon cancer screen-has not had a colonoscopy  She almost did it in the past  Insurance would not pay for the test in the past  She has to schedule it in [place] - and will call us for ref if she needs it   Td was 03- due for Tdap due   Hypothyroid Lab Results  Component Value Date   TSH 1.40 04/02/2010   Clinically  Hyperlipidemia Lab Results  Component Value Date   CHOL 259* 04/02/2010   CHOL 239* 10/27/2008   CHOL 240* 02/05/2007   Lab Results  Component Value Date   HDL 59.40 04/02/2010   HDL 67.20 10/27/2008   HDL 13.0 02/05/2007   No results found for this basename: LDLCALC   Lab Results  Component Value Date   TRIG 100.0 04/02/2010   TRIG 80.0 10/27/2008   TRIG 58 02/05/2007   Lab Results  Component Value Date   CHOLHDL 4 04/02/2010   CHOLHDL 4 10/27/2008   CHOLHDL 4.0 CALC 02/05/2007   Lab Results  Component Value Date   LDLDIRECT 177.6 04/02/2010   LDLDIRECT 153.6 10/27/2008   LDLDIRECT 161.8 02/05/2007    Is due for labs today   Osteopenia  dexa 11/10 LS -2 T score Ca and D Needs to schedule that - [facility]  Patient Active Problem List  Diagnoses  . HYPOTHYROIDISM  . HYPERLIPIDEMIA  . HEEL SPUR  . OSTEOPENIA  . Other screening mammogram  . Routine general medical examination at a health care facility  . Gynecological examination  . Post-menopausal   Past Medical History  Diagnosis Date  . HLD (hyperlipidemia)   . Hypothyroid   . Osteopenia     stress fracture in foot  . Stress fracture of foot   . Plantar  fasciitis   . Heel spur    No past surgical history on file. History  Substance Use Topics  . Smoking status: Never Smoker   . Smokeless tobacco: Not on file  . Alcohol Use: Yes     Occasional   Family History  Problem Relation Age of Onset  . Myelodysplastic syndrome Father   . Hypertension Mother   . Thyroid disease Mother   . Cancer      GM--gallbladder  . Lung cancer      GF  . Colon cancer      GF  . Osteoporosis      GM   Allergies  Allergen Reactions  . Codeine     REACTION: nausea and vomiting   Current Outpatient Prescriptions on File Prior to Visit  Medication Sig Dispense Refill  . Calcium Carbonate-Vitamin D (CALCIUM 600+D) 600-400 MG-UNIT per tablet Take 1 tablet by mouth daily.         Review of Systems Review of Systems  Constitutional: Negative for fever, appetite change, fatigue and unexpected weight change.  Eyes: Negative for pain and visual disturbance.  Respiratory: Negative for  cough and shortness of breath.   Cardiovascular: Negative for cp or palpitations    Gastrointestinal: Negative for nausea, diarrhea and constipation.  Genitourinary: Negative for urgency and frequency.  Skin: Negative for pallor or rash   MSK no fractures recently  Neurological: Negative for weakness, light-headedness, numbness and headaches.  Hematological: Negative for adenopathy. Does not bruise/bleed easily.  Psychiatric/Behavioral: Negative for dysphoric mood. The patient is not nervous/anxious.         Objective:   Physical Exam  Constitutional: She appears well-developed and well-nourished. No distress.  HENT:  Head: Normocephalic and atraumatic.  Right Ear: External ear normal.  Left Ear: External ear normal.  Nose: Nose normal.  Mouth/Throat: Oropharynx is clear and moist.  Eyes: Conjunctivae and EOM are normal. Pupils are equal, round, and reactive to light. No scleral icterus.  Neck: Normal range of motion. Neck supple. No JVD present. Carotid bruit  is not present. No thyromegaly present.  Cardiovascular: Normal rate, regular rhythm, normal heart sounds and intact distal pulses.  Exam reveals no gallop.   Pulmonary/Chest: Effort normal and breath sounds normal. No respiratory distress. She has no wheezes.  Abdominal: Soft. Bowel sounds are normal. She exhibits no distension, no abdominal bruit and no mass. There is no tenderness.  Genitourinary: Vagina normal and uterus normal. No breast swelling, tenderness, discharge or bleeding. There is no rash or tenderness on the right labia. There is no rash or tenderness on the left labia. Uterus is not enlarged and not tender. Cervix exhibits no motion tenderness, no discharge and no friability. Right adnexum displays no mass, no tenderness and no fullness. Left adnexum displays no mass and no tenderness. No bleeding around the vagina. No vaginal discharge found.       Breast exam: No mass, nodules, thickening, tenderness, bulging, retraction, inflamation, nipple discharge or skin changes noted.  No axillary or clavicular LA.  Chaperoned exam.    Musculoskeletal: Normal range of motion. She exhibits no edema and no tenderness.  Lymphadenopathy:    She has no cervical adenopathy.  Neurological: She is alert. She has normal reflexes. No cranial nerve deficit. She exhibits normal muscle tone. Coordination normal.  Skin: Skin is warm and dry. No rash noted. No erythema. No pallor.       Solar lentigos diffusely   Psychiatric: She has a normal mood and affect.          Assessment & Plan:

## 2011-08-15 NOTE — Assessment & Plan Note (Signed)
Due for dexa Rev last one - lowest T score -2 for her LS  Rev ca and D Check D level Enc continued exercise

## 2011-08-15 NOTE — Assessment & Plan Note (Signed)
Pt will schedule own mammo in fall when due Nl breast exam today Enc to do monthly self exam

## 2011-08-15 NOTE — Assessment & Plan Note (Signed)
Reviewed health habits including diet and exercise and skin cancer prevention Also reviewed health mt list, fam hx and immunizations  Wellness labs today Tdap today

## 2011-08-15 NOTE — Assessment & Plan Note (Signed)
Exam with pap done today- unremarkable without problems

## 2011-08-15 NOTE — Assessment & Plan Note (Signed)
Had to change brand due to a manufacturing issue with what she was taking  Feels ok  Lab today and update

## 2011-08-16 LAB — VITAMIN D 25 HYDROXY (VIT D DEFICIENCY, FRACTURES): Vit D, 25-Hydroxy: 52 ng/mL (ref 30–89)

## 2011-08-21 ENCOUNTER — Telehealth: Payer: Self-pay

## 2011-08-21 NOTE — Telephone Encounter (Signed)
Patient advised as instructed via telephone. 

## 2011-08-21 NOTE — Telephone Encounter (Signed)
Left message on cell phone voicemail for patient to return call. 

## 2011-08-21 NOTE — Telephone Encounter (Signed)
Pt left v/m she received lab results in mail and TSH was low normal. Pt feeling very tired and sleepy and wondered if could change thyroid dosage. Pt seen 08/15/11. [facility] [address] is pharmacy and pt can be reached at [phone].

## 2011-08-21 NOTE — Telephone Encounter (Signed)
Her tsh was fine Lab Results  Component Value Date   TSH 1.24 08/15/2011   I would not change the dose

## 2011-08-22 ENCOUNTER — Encounter: Payer: Self-pay | Admitting: *Deleted

## 2011-09-23 ENCOUNTER — Ambulatory Visit: Payer: Self-pay | Admitting: Family Medicine

## 2011-09-23 LAB — HM DEXA SCAN

## 2011-09-24 ENCOUNTER — Encounter: Payer: Self-pay | Admitting: Family Medicine

## 2011-09-25 ENCOUNTER — Encounter: Payer: Self-pay | Admitting: Family Medicine

## 2011-09-25 ENCOUNTER — Encounter: Payer: Self-pay | Admitting: *Deleted

## 2012-05-11 ENCOUNTER — Ambulatory Visit: Payer: Self-pay | Admitting: Family Medicine

## 2012-05-12 ENCOUNTER — Encounter: Payer: Self-pay | Admitting: Family Medicine

## 2012-05-12 LAB — HM MAMMOGRAPHY: HM Mammogram: NORMAL

## 2012-05-13 ENCOUNTER — Encounter: Payer: Self-pay | Admitting: *Deleted

## 2012-05-13 ENCOUNTER — Encounter: Payer: Self-pay | Admitting: Family Medicine

## 2012-06-23 ENCOUNTER — Ambulatory Visit (INDEPENDENT_AMBULATORY_CARE_PROVIDER_SITE_OTHER): Payer: BC Managed Care – PPO | Admitting: Family Medicine

## 2012-06-23 ENCOUNTER — Encounter: Payer: Self-pay | Admitting: Family Medicine

## 2012-06-23 VITALS — BP 118/72 | HR 62 | Temp 98.4°F | Ht 67.0 in | Wt 166.5 lb

## 2012-06-23 DIAGNOSIS — E039 Hypothyroidism, unspecified: Secondary | ICD-10-CM

## 2012-06-23 NOTE — Progress Notes (Signed)
Subjective:    Patient ID: Katrina [name], female    DOB: 01/14/1951, 62 y.o.   MRN: [id number]  HPI Here for f/u of chronic medical problems   Is generally doing ok - but she does notice more sleepiness  Also her scalp itches and her feet are cold Had these symptoms when she was dx  Mood is generally ok   Lab Results  Component Value Date   TSH 1.24 08/15/2011    Is eating a healthy diet and exercising   Ears feel full at times - wants to check those   She does not want her chol checked    She was scheduled for a colonoscopy - then had to cancel it due to copay of 300$ There may be a place in [place]  D/w patient [id number] for colon cancer screening, including IFOB vs. colonoscopy.  Risks and benefits of both were discussed and patient voiced understanding.  Pt elects for: checking on price of colonosc first  Patient Active Problem List  Diagnosis  . HYPOTHYROIDISM  . HYPERLIPIDEMIA  . HEEL SPUR  . OSTEOPENIA  . Other screening mammogram  . Routine general medical examination at a health care facility  . Gynecological examination  . Post-menopausal   Past Medical History  Diagnosis Date  . HLD (hyperlipidemia)   . Hypothyroid   . Osteopenia     stress fracture in foot  . Stress fracture of foot   . Plantar fasciitis   . Heel spur    No past surgical history on file. History  Substance Use Topics  . Smoking status: Never Smoker   . Smokeless tobacco: Not on file  . Alcohol Use: Yes     Comment: Occasional   Family History  Problem Relation Age of Onset  . Myelodysplastic syndrome Father   . Hypertension Mother   . Thyroid disease Mother   . Cancer      GM--gallbladder  . Lung cancer      GF  . Colon cancer      GF  . Osteoporosis      GM   Allergies  Allergen Reactions  . Codeine     REACTION: nausea and vomiting   Current Outpatient Prescriptions on File Prior to Visit  Medication Sig Dispense Refill  . Calcium Carbonate-Vitamin D (CALCIUM  600+D) 600-400 MG-UNIT per tablet Take 1 tablet by mouth daily.      . cholecalciferol (VITAMIN D) 1000 UNITS tablet Take 1,000 Units by mouth daily.      Marland Kitchen levothyroxine (SYNTHROID, LEVOTHROID) 88 MCG tablet Take 1 tablet (88 mcg total) by mouth daily.  90 tablet  3  . Multiple Vitamin (MULTIVITAMIN) tablet Take 1 tablet by mouth daily.       No current facility-administered medications on file prior to visit.     Review of Systems Review of Systems  Constitutional: Negative for fever, appetite change,  and unexpected weight change. pos for fatigue and difficulty mt wt  Eyes: Negative for pain and visual disturbance.  Respiratory: Negative for cough and shortness of breath.   Cardiovascular: Negative for cp or palpitations   neg for edema  Gastrointestinal: Negative for nausea, diarrhea and constipation.  Genitourinary: Negative for urgency and frequency.  Skin: Negative for pallor or rash  pos for dry skin and itching of her scalp Neurological: Negative for weakness, light-headedness, numbness and headaches. neg for tremor  Hematological: Negative for adenopathy. Does not bruise/bleed easily. pos for cold intol Psychiatric/Behavioral: Negative for  dysphoric mood. The patient is not nervous/anxious.         Objective:   Physical Exam  Constitutional: She appears well-developed and well-nourished. No distress.  HENT:  Head: Normocephalic and atraumatic.  Eyes: Conjunctivae and EOM are normal. Pupils are equal, round, and reactive to light. Right eye exhibits no discharge. Left eye exhibits no discharge. No scleral icterus.  Neck: Normal range of motion. Neck supple. No JVD present. Carotid bruit is not present. No thyromegaly present.  Cardiovascular: Regular rhythm.   Pulmonary/Chest: Breath sounds normal. No respiratory distress. She has no wheezes.  Musculoskeletal: She exhibits no edema.  Lymphadenopathy:    She has no cervical adenopathy.  Neurological: She is alert. She has  normal reflexes. She displays no tremor.  Skin: Skin is warm and dry. No rash noted. No erythema. No pallor.  Psychiatric: She has a normal mood and affect.          Assessment & Plan:

## 2012-06-23 NOTE — Patient Instructions (Addendum)
tsh blood test today  Will get back to you about results  Take care of yourself with diet and exercise

## 2012-06-23 NOTE — Assessment & Plan Note (Signed)
Pt has had some decreased energy/ itching of skin and cold intolerance tsh today If it is in nl range - she may want to consider an endocrine consult

## 2012-06-24 LAB — TSH: TSH: 0.49 u[IU]/mL (ref 0.35–5.50)

## 2012-07-19 ENCOUNTER — Encounter: Payer: Self-pay | Admitting: Family Medicine

## 2012-07-19 ENCOUNTER — Ambulatory Visit (INDEPENDENT_AMBULATORY_CARE_PROVIDER_SITE_OTHER): Payer: BC Managed Care – PPO | Admitting: Family Medicine

## 2012-07-19 ENCOUNTER — Telehealth: Payer: Self-pay | Admitting: Family Medicine

## 2012-07-19 VITALS — BP 106/60 | HR 88 | Temp 99.2°F | Wt 162.2 lb

## 2012-07-19 DIAGNOSIS — R509 Fever, unspecified: Secondary | ICD-10-CM

## 2012-07-19 DIAGNOSIS — J020 Streptococcal pharyngitis: Secondary | ICD-10-CM

## 2012-07-19 DIAGNOSIS — J029 Acute pharyngitis, unspecified: Secondary | ICD-10-CM

## 2012-07-19 LAB — POCT INFLUENZA A/B
Influenza A, POC: NEGATIVE
Influenza B, POC: NEGATIVE

## 2012-07-19 LAB — POCT RAPID STREP A (OFFICE): Rapid Strep A Screen: POSITIVE — AB

## 2012-07-19 MED ORDER — AMOXICILLIN 875 MG PO TABS: 875.0000 mg | ORAL_TABLET | Freq: Two times a day (BID) | ORAL | Status: DC

## 2012-07-19 NOTE — Telephone Encounter (Signed)
appt scheduled with Dr. Reece Agar this afternoon

## 2012-07-19 NOTE — Telephone Encounter (Signed)
Patient Information:  Caller Name: Seleta  Phone: [phone]  Patient: Peggie, Hornak  Gender: Female  DOB: [birth year]  Age: 62 Years  PCP: [facility] (Family Practice)  Office Follow Up:  Does the office need to follow up with this patient?: Yes  Instructions For The Office: Pt uses [facility] in [place]   Symptoms  Reason For Call & Symptoms: Pt is calling to say she was exposed to Type B influenza and strep throat 2 weeks ago. Pt started with her sx yesterday. Pt is having fever/chills/body aches and pains/h/a and sore throat.  Reviewed Health History In EMR: Yes  Reviewed Medications In EMR: Yes  Reviewed Allergies In EMR: Yes  Reviewed Surgeries / Procedures: Yes  Date of Onset of Symptoms: 07/18/2012  Any Fever: Yes  Fever Taken: Oral  Fever Time Of Reading: 17:00:00  Fever Last Reading: 101  Guideline(s) Used:  Influenza Exposure  Influenza - Seasonal  Disposition Per Guideline:   Discuss with PCP and Callback by Nurse Today  Reason For Disposition Reached:   Patient requests antiviral medicine for influenza and flu symptoms present < 48 hours  Advice Given:  N/A  Patient Will Follow Care Advice:  YES

## 2012-07-19 NOTE — Telephone Encounter (Signed)
If she was exp to both strep and flu at the same time - needs to be seen so we do not treat for the wrong thing-please schedule with first avail

## 2012-07-19 NOTE — Patient Instructions (Addendum)
You have strep pharyngitis. Treat with amoxicillin twice daily for 10 days. Push fluids and plenty of rest. May use ibuprofen for throat inflammation. Salt water gargles. Suck on cold things like popsicles or warm things like herbal teas (whichever soothes the throat better). Return if fever >101.5, worsening pain, or trouble opening/closing mouth, or hoarse voice. Back to school once fever free for 24 hours (usually 24 hours after starting antibiotic). Good to see you today, call clinic with questions.  Strep Throat Strep throat is an infection of the throat caused by a bacteria named Streptococcus pyogenes. Your caregiver may call the infection streptococcal "tonsillitis" or "pharyngitis" depending on whether there are signs of inflammation in the tonsils or back of the throat. Strep throat is most common in children from 36 to 25 years old during the cold months of the year, but it can occur in people of any age during any season. This infection is spread from person to person (contagious) through coughing, sneezing, or other close contact. SYMPTOMS   Fever or chills.  Painful, swollen, red tonsils or throat.  Pain or difficulty when swallowing.  White or yellow spots on the tonsils or throat.  Swollen, tender lymph nodes or "glands" of the neck or under the jaw.  Red rash all over the body (rare). DIAGNOSIS  Many different infections can cause the same symptoms. A test must be done to confirm the diagnosis so the right treatment can be given. A "rapid strep test" can help your caregiver make the diagnosis in a few minutes. If this test is not available, a light swab of the infected area can be used for a throat culture test. If a throat culture test is done, results are usually available in a day or two. TREATMENT  Strep throat is treated with antibiotic medicine. HOME CARE INSTRUCTIONS   Gargle with 1 tsp of salt in 1 cup of warm water, 3 to 4 times per day or as needed for  comfort.  Family members who also have a sore throat or fever should be tested for strep throat and treated with antibiotics if they have the strep infection.  Make sure everyone in your household washes their hands well.  Do not share food, drinking cups, or personal items that could cause the infection to spread to others.  You may need to eat a soft food diet until your sore throat gets better.  Drink enough water and fluids to keep your urine clear or pale yellow. This will help prevent dehydration.  Get plenty of rest.  Stay home from school, daycare, or work until you have been on antibiotics for 24 hours.  Only take over-the-counter or prescription medicines for pain, discomfort, or fever as directed by your caregiver.  If antibiotics are prescribed, take them as directed. Finish them even if you start to feel better. SEEK MEDICAL CARE IF:   The glands in your neck continue to enlarge.  You develop a rash, cough, or earache.  You cough up green, yellow-brown, or bloody sputum.  You have pain or discomfort not controlled by medicines.  Your problems seem to be getting worse rather than better. SEEK IMMEDIATE MEDICAL CARE IF:   You develop any new symptoms such as vomiting, severe headache, stiff or painful neck, chest pain, shortness of breath, or trouble swallowing.  You develop severe throat pain, drooling, or changes in your voice.  You develop swelling of the neck, or the skin on the neck becomes red and tender.  You have a fever.  You develop signs of dehydration, such as fatigue, dry mouth, and decreased urination.  You become increasingly sleepy, or you cannot wake up completely. Document Released: 03/21/2000 Document Revised: 06/16/2011 Document Reviewed: 05/23/2010 [facility] Patient Information 2013 [place], [place].

## 2012-07-19 NOTE — Progress Notes (Signed)
  Subjective:    Patient ID: Katrina [name], female    DOB: 06-Mar-1951, 62 y.o.   MRN: [id number]  HPI CC: flu?  Yesterday started feeling ill.  Fever, chills, ST, headache, bodys aches.  L earache.  Mild nausea.  So far has been taking day and night cold OTC meds.  No coughing, congestion, rhinorrhea.  No abd pain, vomiting.  Has been exposed recently to type B flu and strep throat 2 weeks ago. No h/o asthma. No smokers at home. Did receive flu shot in November.  Past Medical History  Diagnosis Date  . HLD (hyperlipidemia)   . Hypothyroid   . Osteopenia     stress fracture in foot  . Stress fracture of foot   . Plantar fasciitis   . Heel spur      Review of Systems Per HPI    Objective:   Physical Exam  Nursing note and vitals reviewed. Constitutional: She appears well-developed and well-nourished. No distress.  HENT:  Head: Normocephalic and atraumatic.  Right Ear: Hearing, tympanic membrane, external ear and ear canal normal.  Left Ear: Hearing, tympanic membrane, external ear and ear canal normal.  Nose: No mucosal edema or rhinorrhea. Right sinus exhibits no maxillary sinus tenderness and no frontal sinus tenderness. Left sinus exhibits no maxillary sinus tenderness and no frontal sinus tenderness.  Mouth/Throat: Uvula is midline and mucous membranes are normal. Oropharyngeal exudate, posterior oropharyngeal edema and posterior oropharyngeal erythema present. No tonsillar abscesses.  Tonsillar exudates present bilaterally  Eyes: Conjunctivae and EOM are normal. Pupils are equal, round, and reactive to light. No scleral icterus.  Neck: Normal range of motion. Neck supple.  Cardiovascular: Normal rate, regular rhythm, normal heart sounds and intact distal pulses.   No murmur heard. Pulmonary/Chest: Effort normal and breath sounds normal. No respiratory distress. She has no wheezes. She has no rales.  Lymphadenopathy:    She has cervical adenopathy (mild L AC LAD).   Skin: Skin is warm and dry. No rash noted.  Psychiatric: She has a normal mood and affect.       Assessment & Plan:

## 2012-07-19 NOTE — Assessment & Plan Note (Addendum)
4/4 centor criteria - checked RST today - positive. Given flu exposure, also checked flu nasal swab - negative. Treat with amoxicillin x 10 days. Update [place] if sxs persist after treatment. Discussed red flags to monitor. Pt agrees with plan.

## 2012-07-19 NOTE — Addendum Note (Signed)
Addended by: Josph Macho A on: 07/19/2012 04:18 PM   Modules accepted: Orders

## 2012-09-19 ENCOUNTER — Other Ambulatory Visit: Payer: Self-pay | Admitting: Family Medicine

## 2012-09-20 ENCOUNTER — Other Ambulatory Visit: Payer: Self-pay

## 2012-09-20 NOTE — Telephone Encounter (Signed)
Pt left v/m requesting refill levothyroxine to [facility] [address]. Advised pt ref[facility] to [facility] and pt will ck with pharmacy.

## 2013-03-21 ENCOUNTER — Other Ambulatory Visit: Payer: Self-pay

## 2013-03-21 MED ORDER — LEVOTHYROXINE SODIUM 88 MCG PO TABS: ORAL_TABLET | ORAL | Status: DC

## 2013-03-21 NOTE — Telephone Encounter (Signed)
Pt request 30 day refill levothyroxine 88 mcg to [facility] [address]. Pt already has med refill appt scheduled 03/28/13. Advised pt done.

## 2013-03-25 ENCOUNTER — Ambulatory Visit: Payer: BC Managed Care – PPO | Admitting: Family Medicine

## 2013-03-28 ENCOUNTER — Ambulatory Visit (INDEPENDENT_AMBULATORY_CARE_PROVIDER_SITE_OTHER): Payer: BC Managed Care – PPO | Admitting: Family Medicine

## 2013-03-28 ENCOUNTER — Encounter: Payer: Self-pay | Admitting: Family Medicine

## 2013-03-28 VITALS — BP 108/64 | HR 50 | Temp 97.5°F | Ht 67.0 in | Wt 160.2 lb

## 2013-03-28 DIAGNOSIS — E039 Hypothyroidism, unspecified: Secondary | ICD-10-CM

## 2013-03-28 DIAGNOSIS — M899 Disorder of bone, unspecified: Secondary | ICD-10-CM

## 2013-03-28 DIAGNOSIS — Z1211 Encounter for screening for malignant neoplasm of colon: Secondary | ICD-10-CM | POA: Insufficient documentation

## 2013-03-28 DIAGNOSIS — E785 Hyperlipidemia, unspecified: Secondary | ICD-10-CM

## 2013-03-28 LAB — COMPREHENSIVE METABOLIC PANEL
ALT: 18 U/L (ref 0–35)
AST: 25 U/L (ref 0–37)
Albumin: 3.9 g/dL (ref 3.5–5.2)
Alkaline Phosphatase: 82 U/L (ref 39–117)
BUN: 10 mg/dL (ref 6–23)
CO2: 28 mEq/L (ref 19–32)
Calcium: 9.1 mg/dL (ref 8.4–10.5)
Chloride: 102 mEq/L (ref 96–112)
Creatinine, Ser: 1 mg/dL (ref 0.4–1.2)
GFR: 61.4 mL/min (ref >60.00)
Glucose, Bld: 97 mg/dL (ref 70–99)
Potassium: 4.1 mEq/L (ref 3.5–5.1)
Sodium: 136 mEq/L (ref 135–145)
Total Bilirubin: 1.3 mg/dL — ABNORMAL HIGH (ref 0.3–1.2)
Total Protein: 7 g/dL (ref 6.0–8.3)

## 2013-03-28 LAB — LIPID PANEL
Cholesterol: 249 mg/dL — ABNORMAL HIGH (ref 0–200)
HDL: 56.5 mg/dL (ref >39.00)
Total CHOL/HDL Ratio: 4
Triglycerides: 92 mg/dL (ref 0.0–149.0)
VLDL: 18.4 mg/dL (ref 0.0–40.0)

## 2013-03-28 LAB — TSH: TSH: 2.34 u[IU]/mL (ref 0.35–5.50)

## 2013-03-28 LAB — LDL CHOLESTEROL, DIRECT: Direct LDL: 184.1 mg/dL

## 2013-03-28 MED ORDER — LEVOTHYROXINE SODIUM 88 MCG PO TABS: ORAL_TABLET | ORAL | Status: DC

## 2013-03-28 NOTE — Assessment & Plan Note (Signed)
ifob card given Pt declines colonoscopy   

## 2013-03-28 NOTE — Patient Instructions (Signed)
If you are interested in a shingles/zoster vaccine - call your insurance to check on coverage,( you should not get it within 1 month of other vaccines) , then call us for a prescription  for it to take to a pharmacy that gives the shot , or make a nurse visit to get it here depending on your coverage Labs today Avoid red meat/ fried foods/ egg yolks/ fatty breakfast meats/ butter, cheese and high fat dairy/ and shellfish   Please do stool card for colon cancer screening

## 2013-03-28 NOTE — Assessment & Plan Note (Signed)
Vit D level today  utd dexa  No fx  Disc need for calcium/ vitamin D/ wt bearing exercise and bone density test every 2 y to monitor Disc safety/ fracture risk in detail

## 2013-03-28 NOTE — Assessment & Plan Note (Signed)
Lipids have been high in the past  Pt declines medication Disc goals for lipids and reasons to control them Rev low sat fat diet in detail

## 2013-03-28 NOTE — Progress Notes (Signed)
Subjective:    Patient ID: Katrina [name], female    DOB: 03-10-1951, 62 y.o.   MRN: [id number]  HPI Here for f/u of chronic medical problems   She is doing well  Still exercises and drinks water   Had her flu vaccine at school   Hypothyroid Hypothyroidism  Pt has no clinical changes No change in energy level/ hair or skin/ edema and no tremor She was more tired at last visit - and then she increased her protein and that helped  Lab Results  Component Value Date   TSH 0.49 06/23/2012    She is on generic - levothyroxine  No goiter    Hyperlipidemia Lab Results  Component Value Date   CHOL 258* 08/15/2011   HDL 64.40 08/15/2011   LDLDIRECT 181.2 08/15/2011   TRIG 95.0 08/15/2011   CHOLHDL 4 08/15/2011   she wants to check this today  Is eating lean protein - fruit and veg  Mother has high cholesterol - it runs in her family  If cholesterol  She does not want to take med   Patient Active Problem List   Diagnosis Date Noted  . Colon cancer screening 03/28/2013  . Strep throat 07/19/2012  . Routine general medical examination at a health care facility 08/15/2011  . Gynecological examination 08/15/2011  . Post-menopausal 08/15/2011  . Other screening mammogram 01/09/2011  . OSTEOPENIA 01/29/2007  . HYPOTHYROIDISM 01/12/2007  . HYPERLIPIDEMIA 01/12/2007  . HEEL SPUR 01/12/2007   Past Medical History  Diagnosis Date  . HLD (hyperlipidemia)   . Hypothyroid   . Osteopenia     stress fracture in foot  . Stress fracture of foot   . Plantar fasciitis   . Heel spur    No past surgical history on file. History  Substance Use Topics  . Smoking status: Never Smoker   . Smokeless tobacco: Not on file  . Alcohol Use: Yes     Comment: Occasional   Family History  Problem Relation Age of Onset  . Myelodysplastic syndrome Father   . Hypertension Mother   . Thyroid disease Mother   . Cancer      GM--gallbladder  . Lung cancer      GF  . Colon cancer      GF  .  Osteoporosis      GM   Allergies  Allergen Reactions  . Acetaminophen Other (See Comments)    "hyper"  . Codeine     REACTION: nausea and vomiting   Current Outpatient Prescriptions on File Prior to Visit  Medication Sig Dispense Refill  . Calcium Carbonate-Vitamin D (CALCIUM 600+D) 600-400 MG-UNIT per tablet Take 1 tablet by mouth daily.      . cholecalciferol (VITAMIN D) 1000 UNITS tablet Take 1,000 Units by mouth daily.      . Multiple Vitamin (MULTIVITAMIN) tablet Take 1 tablet by mouth daily.       No current facility-administered medications on file prior to visit.    Review of Systems Review of Systems  Constitutional: Negative for fever, appetite change, fatigue and unexpected weight change.  Eyes: Negative for pain and visual disturbance.  Respiratory: Negative for cough and shortness of breath.   Cardiovascular: Negative for cp or palpitations    Gastrointestinal: Negative for nausea, diarrhea and constipation.  Genitourinary: Negative for urgency and frequency.  Skin: Negative for pallor or rash   Neurological: Negative for weakness, light-headedness, numbness and headaches.  Hematological: Negative for adenopathy. Does not bruise/bleed easily.  Psychiatric/Behavioral: Negative for dysphoric mood. The patient is not nervous/anxious.         Objective:   Physical Exam  Constitutional: She appears well-developed and well-nourished. No distress.  HENT:  Head: Normocephalic and atraumatic.  Nose: Nose normal.  Mouth/Throat: Oropharynx is clear and moist.  Eyes: Conjunctivae and EOM are normal. Pupils are equal, round, and reactive to light. Right eye exhibits no discharge. Left eye exhibits no discharge. No scleral icterus.  Neck: Normal range of motion. Neck supple. No JVD present. No thyromegaly present.  Cardiovascular: Normal rate, regular rhythm, normal heart sounds and intact distal pulses.  Exam reveals no gallop.   Pulmonary/Chest: Effort normal and breath  sounds normal. No respiratory distress. She has no wheezes. She has no rales.  Abdominal: Soft. Bowel sounds are normal. She exhibits no distension and no mass. There is no tenderness.  Musculoskeletal: She exhibits no edema and no tenderness.  Lymphadenopathy:    She has no cervical adenopathy.  Neurological: She is alert. She has normal reflexes. No cranial nerve deficit. She exhibits normal muscle tone. Coordination normal.  Skin: Skin is warm and dry. No rash noted. No erythema. No pallor.  Psychiatric: She has a normal mood and affect.          Assessment & Plan:

## 2013-03-28 NOTE — Progress Notes (Signed)
Pre-visit discussion using our clinic review tool. No additional management support is needed unless otherwise documented below in the visit note.  

## 2013-03-28 NOTE — Assessment & Plan Note (Signed)
No clinical changes  tsh today  Will refill dose if appropriate

## 2013-03-29 ENCOUNTER — Encounter: Payer: Self-pay | Admitting: *Deleted

## 2013-03-29 LAB — VITAMIN D 25 HYDROXY (VIT D DEFICIENCY, FRACTURES): Vit D, 25-Hydroxy: 38 ng/mL (ref 30–89)

## 2013-04-01 ENCOUNTER — Encounter: Payer: Self-pay | Admitting: *Deleted

## 2013-04-08 ENCOUNTER — Telehealth: Payer: Self-pay

## 2013-04-08 NOTE — Telephone Encounter (Signed)
Pt received letter about recent lab results and pt rquest cb about Vit D level.

## 2013-04-12 NOTE — Telephone Encounter (Signed)
Left detailed message on voicemail to return call and leave her question on triage VM and we can get an answer to her more efficiently.

## 2013-04-18 NOTE — Telephone Encounter (Signed)
Left v/m requesting pt to cb with needed information.

## 2013-04-18 NOTE — Telephone Encounter (Signed)
That is fine -it is not very low just a little lower than last time-go ahead and increase it for a month

## 2013-04-18 NOTE — Telephone Encounter (Signed)
Pt left v/m pt is at work and request cb and leave detailed message [phone] re: since pts vit D levels were down in lab results. Pt wants to know if should double her Vit D for one month to help elevate Vit D level.Please advise.

## 2013-04-19 NOTE — Telephone Encounter (Signed)
Pt notified it's okay to double her vit D for 1 month. Pt notified level wasn't to low

## 2013-06-09 ENCOUNTER — Ambulatory Visit: Payer: Self-pay | Admitting: Family Medicine

## 2013-06-10 ENCOUNTER — Encounter: Payer: Self-pay | Admitting: Family Medicine

## 2013-06-13 ENCOUNTER — Encounter: Payer: Self-pay | Admitting: *Deleted

## 2014-04-09 ENCOUNTER — Other Ambulatory Visit: Payer: Self-pay | Admitting: Family Medicine

## 2014-04-17 ENCOUNTER — Ambulatory Visit: Payer: BC Managed Care – PPO | Admitting: Family Medicine

## 2014-04-25 ENCOUNTER — Encounter: Payer: Self-pay | Admitting: Family Medicine

## 2014-04-25 ENCOUNTER — Ambulatory Visit (INDEPENDENT_AMBULATORY_CARE_PROVIDER_SITE_OTHER): Payer: BC Managed Care – PPO | Admitting: Family Medicine

## 2014-04-25 VITALS — BP 106/68 | HR 54 | Temp 97.3°F | Ht 67.0 in | Wt 162.5 lb

## 2014-04-25 DIAGNOSIS — M8580 Other specified disorders of bone density and structure, unspecified site: Secondary | ICD-10-CM

## 2014-04-25 DIAGNOSIS — E785 Hyperlipidemia, unspecified: Secondary | ICD-10-CM

## 2014-04-25 DIAGNOSIS — E039 Hypothyroidism, unspecified: Secondary | ICD-10-CM

## 2014-04-25 LAB — LIPID PANEL
Cholesterol: 241 mg/dL — ABNORMAL HIGH (ref 0–200)
HDL: 60.8 mg/dL (ref >39.00)
LDL Cholesterol: 159 mg/dL — ABNORMAL HIGH (ref 0–99)
NonHDL: 180.2
Total CHOL/HDL Ratio: 4
Triglycerides: 106 mg/dL (ref 0.0–149.0)
VLDL: 21.2 mg/dL (ref 0.0–40.0)

## 2014-04-25 LAB — TSH: TSH: 1.75 u[IU]/mL (ref 0.35–4.50)

## 2014-04-25 MED ORDER — LEVOTHYROXINE SODIUM 88 MCG PO TABS: 88.0000 ug | ORAL_TABLET | Freq: Every day | ORAL | Status: DC

## 2014-04-25 NOTE — Progress Notes (Signed)
Subjective:    Patient ID: Katrina [name], female    DOB: 1951-03-23, 64 y.o.   MRN: [id number]  HPI Here for f/u of her chronic medical conditions   No change   Wt is up 2 lb with bmi of 25  Wants to loose some pain and shape up  Exercise - she likes to do classes at [employer] - toning/ sculpting and balance  Thinking about riding a bike   Had a flu shot in nov  Hypothyroidism  Pt has no clinical changes No change in energy level/ hair or skin/ edema and no tremor Lab Results  Component Value Date   TSH 2.34 03/28/2013     Has high cholesterol  Declines all cholesterol med  Just got back from the beach- diet was   Osteopenia - dexa was 6/13 Does not want to schedule that yet No falls or fx  Great exercise  On ca and D  Patient Active Problem List   Diagnosis Date Noted  . Colon cancer screening 03/28/2013  . Routine general medical examination at a health care facility 08/15/2011  . Gynecological examination 08/15/2011  . Post-menopausal 08/15/2011  . Other screening mammogram 01/09/2011  . Osteopenia 01/29/2007  . Hypothyroidism 01/12/2007  . Hyperlipidemia 01/12/2007  . HEEL SPUR 01/12/2007   Past Medical History  Diagnosis Date  . HLD (hyperlipidemia)   . Hypothyroid   . Osteopenia     stress fracture in foot  . Stress fracture of foot   . Plantar fasciitis   . Heel spur    No past surgical history on file. History  Substance Use Topics  . Smoking status: Never Smoker   . Smokeless tobacco: Not on file  . Alcohol Use: Yes     Comment: Occasional   Family History  Problem Relation Age of Onset  . Myelodysplastic syndrome Father   . Hypertension Mother   . Thyroid disease Mother   . Cancer      GM--gallbladder  . Lung cancer      GF  . Colon cancer      GF  . Osteoporosis      GM   Allergies  Allergen Reactions  . Acetaminophen Other (See Comments)    "hyper"  . Codeine     REACTION: nausea and vomiting   Current Outpatient  Prescriptions on File Prior to Visit  Medication Sig Dispense Refill  . Calcium Carbonate-Vitamin D (CALCIUM 600+D) 600-400 MG-UNIT per tablet Take 1 tablet by mouth daily.    . cholecalciferol (VITAMIN D) 1000 UNITS tablet Take 1,000 Units by mouth daily.    . Multiple Vitamin (MULTIVITAMIN) tablet Take 1 tablet by mouth daily.     No current facility-administered medications on file prior to visit.     Review of Systems Review of Systems  Constitutional: Negative for fever, appetite change, fatigue and unexpected weight change.  Eyes: Negative for pain and visual disturbance.  Respiratory: Negative for cough and shortness of breath.   Cardiovascular: Negative for cp or palpitations    Gastrointestinal: Negative for nausea, diarrhea and constipation.  Genitourinary: Negative for urgency and frequency.  Skin: Negative for pallor or rash   Neurological: Negative for weakness, light-headedness, numbness and headaches.  Hematological: Negative for adenopathy. Does not bruise/bleed easily.  Psychiatric/Behavioral: Negative for dysphoric mood. The patient is not nervous/anxious.         Objective:   Physical Exam  Constitutional: She appears well-developed and well-nourished. No distress.  HENT:  Head: Normocephalic and atraumatic.  Right Ear: External ear normal.  Left Ear: External ear normal.  Nose: Nose normal.  Mouth/Throat: Oropharynx is clear and moist.  Eyes: Conjunctivae and EOM are normal. Pupils are equal, round, and reactive to light. Right eye exhibits no discharge. Left eye exhibits no discharge. No scleral icterus.  Neck: Normal range of motion. Neck supple. No JVD present. Carotid bruit is not present. No thyromegaly present.  Cardiovascular: Normal rate, regular rhythm, normal heart sounds and intact distal pulses.  Exam reveals no gallop.   Pulmonary/Chest: Effort normal and breath sounds normal. No respiratory distress. She has no wheezes. She has no rales.    Abdominal: Soft. Bowel sounds are normal. She exhibits no distension, no abdominal bruit and no mass. There is no tenderness.  Musculoskeletal: She exhibits no edema or tenderness.  Lymphadenopathy:    She has no cervical adenopathy.  Neurological: She is alert. She has normal reflexes. No cranial nerve deficit. She exhibits normal muscle tone. Coordination normal.  Skin: Skin is warm and dry. No rash noted. No erythema. No pallor.  Psychiatric: She has a normal mood and affect.          Assessment & Plan:   Problem List Items Addressed This Visit      Endocrine   Hypothyroidism - Primary    tsh today  No clinical changes  Renewed levothyroxine  No change in exam      Relevant Medications   levothyroxine (SYNTHROID, LEVOTHROID) tablet   Other Relevant Orders   TSH (Completed)     Musculoskeletal and Integument   Osteopenia    Disc need for calcium/ vitamin D/ wt bearing exercise and bone density test every 2 y to monitor Disc safety/ fracture risk in detail  Pt declines dexa at this time -wants to put it off  No falls or fx         Other   Hyperlipidemia    Lipid panel today  Disc goals for lipids and reasons to control them Rev labs with pt from last check  Rev low sat fat diet in detail  She would like to avoid medication  Admits diet was not good on her recent vacation       Relevant Orders   Lipid panel (Completed)

## 2014-04-25 NOTE — Patient Instructions (Signed)
Labs today  Make your annual exam a year from now  Take your calcium and vitamin D  Keep exercising  For cholesterol   Avoid red meat/ fried foods/ egg yolks/ fatty breakfast meats/ butter, cheese and high fat dairy/ and shellfish

## 2014-04-25 NOTE — Progress Notes (Signed)
Pre visit review using our clinic review tool, if applicable. No additional management support is needed unless otherwise documented below in the visit note. 

## 2014-04-26 ENCOUNTER — Encounter: Payer: Self-pay | Admitting: *Deleted

## 2014-04-28 NOTE — Assessment & Plan Note (Signed)
tsh today  No clinical changes  Renewed levothyroxine  No change in exam

## 2014-04-28 NOTE — Assessment & Plan Note (Signed)
Lipid panel today  Disc goals for lipids and reasons to control them Rev labs with pt from last check  Rev low sat fat diet in detail  She would like to avoid medication  Admits diet was not good on her recent vacation

## 2014-04-28 NOTE — Assessment & Plan Note (Signed)
Disc need for calcium/ vitamin D/ wt bearing exercise and bone density test every 2 y to monitor Disc safety/ fracture risk in detail  Pt declines dexa at this time -wants to put it off  No falls or fx

## 2014-07-24 ENCOUNTER — Encounter: Payer: BC Managed Care – PPO | Admitting: Family Medicine

## 2015-04-13 ENCOUNTER — Other Ambulatory Visit: Payer: Self-pay | Admitting: Family Medicine

## 2015-04-13 DIAGNOSIS — Z1231 Encounter for screening mammogram for malignant neoplasm of breast: Secondary | ICD-10-CM

## 2015-04-18 ENCOUNTER — Ambulatory Visit
Admission: RE | Admit: 2015-04-18 | Discharge: 2015-04-18 | Disposition: A | Discharge status: Home / Self Care | Payer: BC Managed Care – PPO | Source: Ambulatory Visit | Attending: Family Medicine | Admitting: Family Medicine

## 2015-04-18 DIAGNOSIS — Z1231 Encounter for screening mammogram for malignant neoplasm of breast: Secondary | ICD-10-CM

## 2015-04-18 LAB — HM MAMMOGRAPHY: HM Mammogram: NORMAL

## 2015-04-19 ENCOUNTER — Encounter: Payer: Self-pay | Admitting: *Deleted

## 2015-04-19 ENCOUNTER — Encounter: Payer: Self-pay | Admitting: Family Medicine

## 2015-07-12 ENCOUNTER — Other Ambulatory Visit: Payer: Self-pay | Admitting: Family Medicine

## 2015-07-23 ENCOUNTER — Encounter: Payer: BC Managed Care – PPO | Admitting: Family Medicine

## 2015-08-06 ENCOUNTER — Telehealth: Payer: Self-pay | Admitting: Family Medicine

## 2015-08-06 NOTE — Telephone Encounter (Signed)
Patient Name: Katrina [name] DOB: 02-07-1951 Initial Comment Caller states, donated blood yesterday, blood pressure was 84/60. Also has extremely tired. Wants to sleep all the time. Nurse Assessment Nurse: Ronnald Ramp, RN, Miranda Date/Time (Eastern Time): 08/06/2015 10:26:56 AM Confirm and document reason for call. If symptomatic, describe symptoms. You must click the next button to save text entered. ---Caller states yesterday she tried to give blood but her BP was too low, it was 84/60 but it was up once she went home. BP was 110/70 (normal BP is 90/60). She has had fatigue for > 5 months. Has the patient traveled out of the country within the last 30 days? ---Not Applicable Does the patient have any new or worsening symptoms? ---Yes Will a triage be completed? ---Yes Related visit to physician within the last 2 weeks? ---No Does the PT have any chronic conditions? (i.e. diabetes, asthma, etc.) ---Yes List chronic conditions. ---Thyroid Is this a behavioral health or substance abuse call? ---No Guidelines Guideline Title Affirmed Question Affirmed Notes Low Blood Pressure [id number] Systolic BP [id number] AND [id number] taking blood pressure medications AND [3] NOT dizzy, lightheaded or weak Final Disposition User See Physician within 24 Hours Ronnald Ramp, RN, Miranda Comments Appt scheduled for tomorrow 5/2 at 2:45pm with Dr. Danise Mina. Referrals REFERRED TO PCP OFFICE Disagree/Comply: Comply

## 2015-08-06 NOTE — Telephone Encounter (Signed)
Will see then. 

## 2015-08-06 NOTE — Telephone Encounter (Signed)
TH scheduled pt with Dr Danise Mina on 08/07/15 at 2:45.

## 2015-08-07 ENCOUNTER — Encounter: Payer: Self-pay | Admitting: Family Medicine

## 2015-08-07 ENCOUNTER — Ambulatory Visit (INDEPENDENT_AMBULATORY_CARE_PROVIDER_SITE_OTHER): Payer: BC Managed Care – PPO | Admitting: Family Medicine

## 2015-08-07 VITALS — BP 110/74 | HR 60 | Temp 97.9°F | Wt 161.5 lb

## 2015-08-07 DIAGNOSIS — R5382 Chronic fatigue, unspecified: Secondary | ICD-10-CM | POA: Diagnosis not present

## 2015-08-07 DIAGNOSIS — E039 Hypothyroidism, unspecified: Secondary | ICD-10-CM

## 2015-08-07 LAB — T3: T3, Total: 93 ng/dL (ref 76–181)

## 2015-08-07 NOTE — Assessment & Plan Note (Signed)
Marked fatigue ongoing for 6-8 months, daytime somnolence without other OSA sxs, hair loss, hypothyroid history. Check for reversible causes of fatigue. Update with lab results. Upcoming CPE next month.

## 2015-08-07 NOTE — Assessment & Plan Note (Signed)
Update TFTs today. Marked fatigue.

## 2015-08-07 NOTE — Progress Notes (Signed)
BP 110/74 mmHg  Pulse 60  Temp(Src) 97.9 F (36.6 C) (Oral)  Wt 161 lb 8 oz (73.256 kg)   CC: fatigue  Subjective:    Patient ID: Katrina [name], female    DOB: 1951/02/01, 65 y.o.   MRN: [id number]  HPI: Katrina [name] is a 65 y.o. female presenting on 08/07/2015 for Fatigue and Blood Pressure Check   This fall had marriage of 2 of her children, then had new grandchild. Very busy, very tired since then over last 6-8 months. Went to donate blood - denied because bp 84/60. Hgb 12.5? (finger prick). Also noticing hair loss. Marked daytime somnolence. No snoring, apneic events or PNdyspnea.   Hypothyroid - last TSH 04/2014.  Lab Results  Component Value Date   TSH 1.75 04/25/2014    Denies dizziness/lightheadedness. No fevers/chills, appetite changes, weight changes, abd pain, chest pain, dyspnea, blood in stool or urine. No other thyroid symptoms recently.  Upcoming CPE next month 09/2015.  Staying well hydrated, exercises regularly (core yoga) 1.5 hrs 2x/wk, walking some. Good diverse diet. Getting good sleep. Denies significant stress, depression/anxiety currently. Averages 7-8 hours sleep/day. No anhedonia - enjoys family.   She started biotin and changed shampoos which did help hair loss.  She started taking b12. She is also taking calcium, vit D, vit C and MVI.  Relevant past medical, surgical, family and social history reviewed and updated as indicated. Interim medical history since our last visit reviewed. Allergies and medications reviewed and updated. Current Outpatient Prescriptions on File Prior to Visit  Medication Sig  . Calcium Carbonate-Vitamin D (CALCIUM 600+D) 600-400 MG-UNIT per tablet Take 1 tablet by mouth daily.  . cholecalciferol (VITAMIN D) 1000 UNITS tablet Take 1,000 Units by mouth daily.  Marland Kitchen levothyroxine (SYNTHROID, LEVOTHROID) 88 MCG tablet TAKE ONE TABLET BY MOUTH ONCE DAILY  . Multiple Vitamin (MULTIVITAMIN) tablet Take 1 tablet by mouth daily.   No  current facility-administered medications on file prior to visit.    Review of Systems Per HPI unless specifically indicated in ROS section     Objective:    BP 110/74 mmHg  Pulse 60  Temp(Src) 97.9 F (36.6 C) (Oral)  Wt 161 lb 8 oz (73.256 kg)  Wt Readings from Last 3 Encounters:  08/07/15 161 lb 8 oz (73.256 kg)  04/25/14 162 lb 8 oz (73.71 kg)  03/28/13 160 lb 4 oz (72.689 kg)    Physical Exam  Constitutional: She appears well-developed and well-nourished. No distress.  HENT:  Mouth/Throat: Oropharynx is clear and moist. No oropharyngeal exudate.  Eyes: Conjunctivae and EOM are normal. Pupils are equal, round, and reactive to light. No scleral icterus.  No subconjunctival pallor  Neck: Normal range of motion. Neck supple. No thyromegaly present.  Cardiovascular: Normal rate, regular rhythm, normal heart sounds and intact distal pulses.   No murmur heard. Pulmonary/Chest: Effort normal and breath sounds normal. No respiratory distress. She has no wheezes. She has no rales.  Musculoskeletal: She exhibits no edema.  Lymphadenopathy:    She has no cervical adenopathy.  Skin: Skin is warm and dry. No rash noted. No pallor.  Psychiatric: She has a normal mood and affect.  Nursing note and vitals reviewed.  Results for orders placed or performed in visit on 04/19/15  [facility] MAMMOGRAPHY  Result Value Ref Range   [facility] Mammogram normal       Assessment & Plan:   Problem List Items Addressed This Visit    Hypothyroidism  Update TFTs today. Marked fatigue.       Relevant Orders   TSH   T3   T4, free   Chronic fatigue - Primary    Marked fatigue ongoing for 6-8 months, daytime somnolence without other OSA sxs, hair loss, hypothyroid history. Check for reversible causes of fatigue. Update with lab results. Upcoming CPE next month.       Relevant Orders   Comprehensive metabolic panel   TSH   CBC with Differential/Platelet   T3   T4, free   Vitamin B12   IBC panel    VITAMIN D 25 Hydroxy (Vit-D Deficiency, Fractures)       Follow up plan: Return if symptoms worsen or fail to improve.  Ria Bush, MD

## 2015-08-07 NOTE — Progress Notes (Signed)
Pre visit review using our clinic review tool, if applicable. No additional management support is needed unless otherwise documented below in the visit note. 

## 2015-08-07 NOTE — Patient Instructions (Addendum)
Labwork today. We will call you with results.  Good to see you today, call us with questions.

## 2015-08-08 LAB — COMPREHENSIVE METABOLIC PANEL
ALT: 13 U/L (ref 0–35)
AST: 19 U/L (ref 0–37)
Albumin: 4.2 g/dL (ref 3.5–5.2)
Alkaline Phosphatase: 74 U/L (ref 39–117)
BUN: 11 mg/dL (ref 6–23)
CO2: 29 mEq/L (ref 19–32)
Calcium: 9.7 mg/dL (ref 8.4–10.5)
Chloride: 102 mEq/L (ref 96–112)
Creatinine, Ser: 0.89 mg/dL (ref 0.40–1.20)
GFR: 68.09 mL/min (ref >60.00)
Glucose, Bld: 82 mg/dL (ref 70–99)
Potassium: 4.5 mEq/L (ref 3.5–5.1)
Sodium: 138 mEq/L (ref 135–145)
Total Bilirubin: 0.5 mg/dL (ref 0.2–1.2)
Total Protein: 7.2 g/dL (ref 6.0–8.3)

## 2015-08-08 LAB — VITAMIN B12: Vitamin B-12: 513 pg/mL (ref 211–911)

## 2015-08-08 LAB — CBC WITH DIFFERENTIAL/PLATELET
Basophils Absolute: 0.1 10*3/uL (ref 0.0–0.1)
Basophils Relative: 0.8 % (ref 0.0–3.0)
Eosinophils Absolute: 0.2 10*3/uL (ref 0.0–0.7)
Eosinophils Relative: 2.9 % (ref 0.0–5.0)
HCT: 42.1 % (ref 36.0–46.0)
Hemoglobin: 14.2 g/dL (ref 12.0–15.0)
Lymphocytes Relative: 35.7 % (ref 12.0–46.0)
Lymphs Abs: 2.2 10*3/uL (ref 0.7–4.0)
MCHC: 33.7 g/dL (ref 30.0–36.0)
MCV: 92.9 fl (ref 78.0–100.0)
Monocytes Absolute: 0.6 10*3/uL (ref 0.1–1.0)
Monocytes Relative: 9.2 % (ref 3.0–12.0)
Neutro Abs: 3.1 10*3/uL (ref 1.4–7.7)
Neutrophils Relative %: 51.4 % (ref 43.0–77.0)
Platelets: 260 10*3/uL (ref 150.0–400.0)
RBC: 4.53 Mil/uL (ref 3.87–5.11)
RDW: 13 % (ref 11.5–15.5)
WBC: 6 10*3/uL (ref 4.0–10.5)

## 2015-08-08 LAB — IBC PANEL
Iron: 74 ug/dL (ref 42–145)
Saturation Ratios: 18 % — ABNORMAL LOW (ref 20.0–50.0)
Transferrin: 294 mg/dL (ref 212.0–360.0)

## 2015-08-08 LAB — VITAMIN D 25 HYDROXY (VIT D DEFICIENCY, FRACTURES): VITD: 26.96 ng/mL — ABNORMAL LOW (ref 30.00–100.00)

## 2015-08-08 LAB — TSH: TSH: 2.71 u[IU]/mL (ref 0.35–4.50)

## 2015-08-08 LAB — T4, FREE: Free T4: 0.89 ng/dL (ref 0.60–1.60)

## 2015-08-10 ENCOUNTER — Other Ambulatory Visit: Payer: Self-pay | Admitting: Family Medicine

## 2015-08-10 DIAGNOSIS — Z1211 Encounter for screening for malignant neoplasm of colon: Secondary | ICD-10-CM

## 2015-09-28 ENCOUNTER — Encounter: Payer: Self-pay | Admitting: Family Medicine

## 2015-09-28 ENCOUNTER — Ambulatory Visit (INDEPENDENT_AMBULATORY_CARE_PROVIDER_SITE_OTHER): Payer: BC Managed Care – PPO | Admitting: Family Medicine

## 2015-09-28 ENCOUNTER — Other Ambulatory Visit (HOSPITAL_COMMUNITY)
Admission: RE | Admit: 2015-09-28 | Discharge: 2015-09-28 | Disposition: A | Discharge status: Home / Self Care | Payer: BC Managed Care – PPO | Source: Ambulatory Visit | Attending: Family Medicine | Admitting: Family Medicine

## 2015-09-28 VITALS — BP 116/74 | HR 60 | Temp 98.1°F | Ht 66.0 in | Wt 158.5 lb

## 2015-09-28 DIAGNOSIS — Z1211 Encounter for screening for malignant neoplasm of colon: Secondary | ICD-10-CM

## 2015-09-28 DIAGNOSIS — E039 Hypothyroidism, unspecified: Secondary | ICD-10-CM

## 2015-09-28 DIAGNOSIS — M8580 Other specified disorders of bone density and structure, unspecified site: Secondary | ICD-10-CM | POA: Diagnosis not present

## 2015-09-28 DIAGNOSIS — Z01419 Encounter for gynecological examination (general) (routine) without abnormal findings: Secondary | ICD-10-CM

## 2015-09-28 DIAGNOSIS — Z0000 Encounter for general adult medical examination without abnormal findings: Secondary | ICD-10-CM

## 2015-09-28 DIAGNOSIS — E785 Hyperlipidemia, unspecified: Secondary | ICD-10-CM | POA: Diagnosis not present

## 2015-09-28 DIAGNOSIS — Z1151 Encounter for screening for human papillomavirus (HPV): Secondary | ICD-10-CM | POA: Insufficient documentation

## 2015-09-28 DIAGNOSIS — E2839 Other primary ovarian failure: Secondary | ICD-10-CM

## 2015-09-28 LAB — LIPID PANEL
Cholesterol: 240 mg/dL — ABNORMAL HIGH (ref 0–200)
HDL: 54.9 mg/dL (ref >39.00)
LDL Cholesterol: 164 mg/dL — ABNORMAL HIGH (ref 0–99)
NonHDL: 184.8
Total CHOL/HDL Ratio: 4
Triglycerides: 106 mg/dL (ref 0.0–149.0)
VLDL: 21.2 mg/dL (ref 0.0–40.0)

## 2015-09-28 MED ORDER — LEVOTHYROXINE SODIUM 88 MCG PO TABS
88.0000 ug | ORAL_TABLET | Freq: Every day | ORAL | Status: DC
Start: 2015-09-28 — End: 2016-10-07

## 2015-09-28 NOTE — Progress Notes (Signed)
Pre visit review using our clinic review tool, if applicable. No additional management support is needed unless otherwise documented below in the visit note. 

## 2015-09-28 NOTE — Progress Notes (Signed)
Subjective:    Patient ID: Katrina [name], female    DOB: 1950/11/28, 65 y.o.   MRN: [id number]  HPI Here for health maintenance exam and to review chronic medical problems   Had a visit with Dr Bonnye Fava feeling well  Extremely tired  Inc her D and protein -some imp Now school is out - sleeping more  Starting to feel a little better    Wt Readings from Last 3 Encounters:  09/28/15 158 lb 8 oz (71.895 kg)  08/07/15 161 lb 8 oz (73.256 kg)  04/25/14 162 lb 8 oz (73.71 kg)  bmi of 25.5 Still exercises regulary   Hep C/ HIV screen Not high risk/declines   Colon cancer screening  Has not had a colonoscopy  Wants to get one  Thinks she has to go to [facility]   Zoster vaccine - she is interested/ unsure if ins covers   Pap 5/13-normal- has not had one since  No vaginal bleeding No new partners  Mammogram 1/17-normal Self exam -no lumps or changes   Td 5/13  Did get a flu shot in the fall   Vit D dev level 26.9- so she inc her vit D 2000 plus what is in her other vitamin  Prev was just taking 1000  No falls  No fractures  dexa 6/13 osteopenia  Due for one - she needs to find out where her ins pays for that   Vit B12 level 513  Lab Results  Component Value Date   TSH 2.71 08/07/2015   T3TOTAL 93.0 08/07/2015   fine on current dose    Routine labs Office Visit on 08/07/2015  Component Date Value Ref Range Status  . Sodium 08/07/2015 138  135 - 145 mEq/L Final  . Potassium 08/07/2015 4.5  3.5 - 5.1 mEq/L Final  . Chloride 08/07/2015 102  96 - 112 mEq/L Final  . CO2 08/07/2015 29  19 - 32 mEq/L Final  . Glucose, Bld 08/07/2015 82  70 - 99 mg/dL Final  . BUN 08/07/2015 11  6 - 23 mg/dL Final  . Creatinine, Ser 08/07/2015 0.89  0.40 - 1.20 mg/dL Final  . Total Bilirubin 08/07/2015 0.5  0.2 - 1.2 mg/dL Final  . Alkaline Phosphatase 08/07/2015 74  39 - 117 U/L Final  . AST 08/07/2015 19  0 - 37 U/L Final  . ALT 08/07/2015 13  0 - 35 U/L Final  . Total Protein  08/07/2015 7.2  6.0 - 8.3 g/dL Final  . Albumin 08/07/2015 4.2  3.5 - 5.2 g/dL Final  . Calcium 08/07/2015 9.7  8.4 - 10.5 mg/dL Final  . GFR 08/07/2015 68.09  >60.00 mL/min Final  . TSH 08/07/2015 2.71  0.35 - 4.50 uIU/mL Final  . WBC 08/07/2015 6.0  4.0 - 10.5 K/uL Final  . RBC 08/07/2015 4.53  3.87 - 5.11 Mil/uL Final  . Hemoglobin 08/07/2015 14.2  12.0 - 15.0 g/dL Final  . HCT 08/07/2015 42.1  36.0 - 46.0 % Final  . MCV 08/07/2015 92.9  78.0 - 100.0 fl Final  . MCHC 08/07/2015 33.7  30.0 - 36.0 g/dL Final  . RDW 08/07/2015 13.0  11.5 - 15.5 % Final  . Platelets 08/07/2015 260.0  150.0 - 400.0 K/uL Final  . Neutrophils Relative % 08/07/2015 51.4  43.0 - 77.0 % Final  . Lymphocytes Relative 08/07/2015 35.7  12.0 - 46.0 % Final  . Monocytes Relative 08/07/2015 9.2  3.0 - 12.0 % Final  . Eosinophils Relative  08/07/2015 2.9  0.0 - 5.0 % Final  . Basophils Relative 08/07/2015 0.8  0.0 - 3.0 % Final  . Neutro Abs 08/07/2015 3.1  1.4 - 7.7 K/uL Final  . Lymphs Abs 08/07/2015 2.2  0.7 - 4.0 K/uL Final  . Monocytes Absolute 08/07/2015 0.6  0.1 - 1.0 K/uL Final  . Eosinophils Absolute 08/07/2015 0.2  0.0 - 0.7 K/uL Final  . Basophils Absolute 08/07/2015 0.1  0.0 - 0.1 K/uL Final  . T3, Total 08/07/2015 93.0  76 - 181 ng/dL Final  . Free T4 08/07/2015 0.89  0.60 - 1.60 ng/dL Final  . Vitamin B-12 08/07/2015 513  211 - 911 pg/mL Final  . Iron 08/07/2015 74  42 - 145 ug/dL Final  . Transferrin 08/07/2015 294.0  212.0 - 360.0 mg/dL Final  . Saturation Ratios 08/07/2015 18.0* 20.0 - 50.0 % Final  . VITD 08/07/2015 26.96* 30.00 - 100.00 ng/mL Final    Due for cholesterol check Last LDL is 159  Had improved but still high in 2016  Is watching diet carefully   Patient Active Problem List   Diagnosis Date Noted  . Encounter for routine gynecological examination 09/28/2015  . Estrogen deficiency 09/28/2015  . Chronic fatigue 08/07/2015  . Colon cancer screening 03/28/2013  . Routine general  medical examination at a health care facility 08/15/2011  . Gynecological examination 08/15/2011  . Post-menopausal 08/15/2011  . Other screening mammogram 01/09/2011  . Osteopenia 01/29/2007  . Hypothyroidism 01/12/2007  . Hyperlipidemia 01/12/2007  . HEEL SPUR 01/12/2007   Past Medical History  Diagnosis Date  . HLD (hyperlipidemia)   . Hypothyroid   . Osteopenia     stress fracture in foot  . Stress fracture of foot   . Plantar fasciitis   . Heel spur    No past surgical history on file. Social History  Substance Use Topics  . Smoking status: Never Smoker   . Smokeless tobacco: None  . Alcohol Use: 0.0 oz/week    0 Standard drinks or equivalent per week     Comment: Occasional   Family History  Problem Relation Age of Onset  . Myelodysplastic syndrome Father   . Hypertension Mother   . Thyroid disease Mother   . Cancer      GM--gallbladder  . Lung cancer      GF  . Colon cancer      GF  . Osteoporosis      GM  . Breast cancer Maternal Aunt     mat great aunt   Allergies  Allergen Reactions  . Acetaminophen Other (See Comments)    "hyper"  . Codeine     REACTION: nausea and vomiting   Current Outpatient Prescriptions on File Prior to Visit  Medication Sig Dispense Refill  . BIOTIN PO Take 10,000 mcg by mouth daily.     . Multiple Vitamin (MULTIVITAMIN) tablet Take 1 tablet by mouth daily.     No current facility-administered medications on file prior to visit.    Review of Systems Review of Systems  Constitutional: Negative for fever, appetite change, and unexpected weight change.  Eyes: Negative for pain and visual disturbance.  Respiratory: Negative for cough and shortness of breath.   Cardiovascular: Negative for cp or palpitations    Gastrointestinal: Negative for nausea, diarrhea and constipation.  Genitourinary: Negative for urgency and frequency.  Skin: Negative for pallor or rash   Neurological: Negative for weakness, light-headedness,  numbness and headaches.  Hematological: Negative for adenopathy. Does  not bruise/bleed easily.  Psychiatric/Behavioral: Negative for dysphoric mood. The patient is not nervous/anxious.         Objective:   Physical Exam  Constitutional: She appears well-developed and well-nourished. No distress.  Well appearing   HENT:  Head: Normocephalic and atraumatic.  Right Ear: External ear normal.  Left Ear: External ear normal.  Mouth/Throat: Oropharynx is clear and moist.  Eyes: Conjunctivae and EOM are normal. Pupils are equal, round, and reactive to light. No scleral icterus.  Neck: Normal range of motion. Neck supple. No JVD present. Carotid bruit is not present. No thyromegaly present.  Cardiovascular: Normal rate, regular rhythm, normal heart sounds and intact distal pulses.  Exam reveals no gallop.   Pulmonary/Chest: Effort normal and breath sounds normal. No respiratory distress. She has no wheezes. She exhibits no tenderness.  Abdominal: Soft. Bowel sounds are normal. She exhibits no distension, no abdominal bruit and no mass. There is no tenderness.  Genitourinary: No breast swelling, tenderness, discharge or bleeding.          Anus appears normal w/o hemorrhoids or masses     External genitalia : nl appearance and hair distribution/no lesions     Urethral meatus : nl size, no lesions or prolapse     Urethra: no masses, tenderness or scarring    Bladder : no masses or tenderness     Vagina: nl general appearance, no discharge or  Lesions, no significant cystocele  or rectocele     Cervix: no lesions/ discharge or friability    Uterus: nl size, contour, position, and mobility (not fixed) , non tender    Adnexa : no masses, tenderness, enlargement or nodularity     Breast exam: No mass, nodules, thickening, tenderness, bulging, retraction, inflamation, nipple discharge or skin changes noted.  No axillary or clavicular LA.      Musculoskeletal: Normal range of  motion. She exhibits no edema or tenderness.  Lymphadenopathy:    She has no cervical adenopathy.  Neurological: She is alert. She has normal reflexes. No cranial nerve deficit. She exhibits normal muscle tone. Coordination normal.  Skin: Skin is warm and dry. No rash noted. No erythema. No pallor.  Solar lentigines noted  Psychiatric: She has a normal mood and affect.          Assessment & Plan:   Problem List Items Addressed This Visit      Endocrine   Hypothyroidism    Hypothyroidism  Pt has no clinical changes No change in energy level/ hair or skin/ edema and no tremor  (baseline fatigue unchanged) Lab Results  Component Value Date   TSH 2.71 08/07/2015          Relevant Medications   levothyroxine (SYNTHROID, LEVOTHROID) 88 MCG tablet     Musculoskeletal and Integument   Osteopenia    dexa due-ordered No falls or fx Disc need for calcium/ vitamin D/ wt bearing exercise and bone density test every 2 y to monitor Disc safety/ fracture risk in detail          Other   Routine general medical examination at a health care facility - Primary    Reviewed health habits including diet and exercise and skin cancer prevention Reviewed appropriate screening tests for age  Also reviewed health mt list, fam hx and immunization status , as well as social and family history   See HPI Labs reviewed Gyn exam done  Pap done today If you are interested in a shingles/zoster vaccine - call  your insurance to check on coverage,( you should not get it within 1 month of other vaccines) , then call us for a prescription  for it to take to a pharmacy that gives the shot , or make a nurse visit to get it here depending on your coverage Stop at check out for referral for dexa and colonoscopy  Eat a healthy low fat diet    (Avoid red meat/ fried foods/ egg yolks/ fatty breakfast meats/ butter, cheese and high fat dairy/ and shellfish) Cholesterol check today  Declines HIV and Hep c screen  due to low risk      Relevant Orders   Lipid panel (Completed)   Hyperlipidemia    Disc goals for lipids and reasons to control them Rev labs with pt-from last check  Rev low sat fat diet in detail  Lab today for lipid panel      Relevant Orders   Lipid panel (Completed)   Estrogen deficiency   Relevant Orders   DG Bone Density   Encounter for routine gynecological examination    No problems or c/o  Nl exam  Pap sent      Relevant Orders   Cytology - PAP   Colon cancer screening    Due for screening colonoscopy Referred for that       Relevant Orders   Ambulatory referral to Gastroenterology

## 2015-09-28 NOTE — Patient Instructions (Signed)
Pap done today If you are interested in a shingles/zoster vaccine - call your insurance to check on coverage,( you should not get it within 1 month of other vaccines) , then call us for a prescription  for it to take to a pharmacy that gives the shot , or make a nurse visit to get it here depending on your coverage Stop at check out for referral for dexa and colonoscopy  Eat a healthy low fat diet    (Avoid red meat/ fried foods/ egg yolks/ fatty breakfast meats/ butter, cheese and high fat dairy/ and shellfish) Cholesterol check today

## 2015-09-29 NOTE — Assessment & Plan Note (Signed)
Reviewed health habits including diet and exercise and skin cancer prevention Reviewed appropriate screening tests for age  Also reviewed health mt list, fam hx and immunization status , as well as social and family history   See HPI Labs reviewed Gyn exam done  Pap done today If you are interested in a shingles/zoster vaccine - call your insurance to check on coverage,( you should not get it within 1 month of other vaccines) , then call us for a prescription  for it to take to a pharmacy that gives the shot , or make a nurse visit to get it here depending on your coverage Stop at check out for referral for dexa and colonoscopy  Eat a healthy low fat diet    (Avoid red meat/ fried foods/ egg yolks/ fatty breakfast meats/ butter, cheese and high fat dairy/ and shellfish) Cholesterol check today  Declines HIV and Hep c screen due to low risk

## 2015-09-29 NOTE — Assessment & Plan Note (Signed)
dexa due-ordered No falls or fx Disc need for calcium/ vitamin D/ wt bearing exercise and bone density test every 2 y to monitor Disc safety/ fracture risk in detail

## 2015-09-29 NOTE — Assessment & Plan Note (Signed)
Due for screening colonoscopy Referred for that

## 2015-09-29 NOTE — Assessment & Plan Note (Signed)
Hypothyroidism  Pt has no clinical changes No change in energy level/ hair or skin/ edema and no tremor  (baseline fatigue unchanged) Lab Results  Component Value Date   TSH 2.71 08/07/2015

## 2015-09-29 NOTE — Assessment & Plan Note (Addendum)
No problems or c/o  Nl exam  Pap sent

## 2015-09-29 NOTE — Assessment & Plan Note (Signed)
Disc goals for lipids and reasons to control them Rev labs with pt-from last check  Rev low sat fat diet in detail  Lab today for lipid panel

## 2015-10-02 LAB — CYTOLOGY - PAP

## 2015-10-31 ENCOUNTER — Encounter: Payer: Self-pay | Admitting: Family Medicine

## 2016-02-27 ENCOUNTER — Encounter: Payer: Self-pay | Admitting: Family Medicine

## 2016-02-27 ENCOUNTER — Telehealth: Payer: Self-pay | Admitting: Family Medicine

## 2016-02-27 NOTE — Telephone Encounter (Signed)
Thanks for trying

## 2016-02-27 NOTE — Telephone Encounter (Signed)
Dr tower  I have made several attempts to schedule a bone density since June  with Ms Gura.  I also mailed a letter today asking her to call the office to schedule.

## 2016-03-07 NOTE — Telephone Encounter (Signed)
FYI  Spoke with pt she stated she wanted to wait until she is on medicare to do a bone density.  Her deductable now is $300.  She also stated she was going to call her GI dr after the first of the year to schedule her colonoscopy

## 2016-03-07 NOTE — Telephone Encounter (Signed)
Thanks for letting me know  Please cancel any referrals that need cancelling

## 2016-04-16 ENCOUNTER — Telehealth: Payer: Self-pay

## 2016-04-16 NOTE — Telephone Encounter (Signed)
Pt said she has a couple of days left of levothyroxine and [facility] [address] said they have requested authorization to change levothyroxine but have not got cb. I do not see request in pts chart but I advised pt I am not saying that they did not do what they said they did but I cannot find request in pts chart. I have tried several times to contact [facility] but line is consistently busy. Pt will contact [facility] and have them contact our office.

## 2016-05-06 ENCOUNTER — Encounter: Payer: Self-pay | Admitting: Family Medicine

## 2016-05-06 ENCOUNTER — Ambulatory Visit (INDEPENDENT_AMBULATORY_CARE_PROVIDER_SITE_OTHER)
Admission: RE | Admit: 2016-05-06 | Discharge: 2016-05-06 | Disposition: A | Discharge status: Home / Self Care | Payer: BC Managed Care – PPO | Source: Ambulatory Visit | Attending: Family Medicine | Admitting: Family Medicine

## 2016-05-06 ENCOUNTER — Ambulatory Visit (INDEPENDENT_AMBULATORY_CARE_PROVIDER_SITE_OTHER): Payer: BC Managed Care – PPO | Admitting: Family Medicine

## 2016-05-06 VITALS — BP 106/76 | HR 64 | Temp 98.8°F | Ht 66.0 in | Wt 166.0 lb

## 2016-05-06 DIAGNOSIS — R059 Cough, unspecified: Secondary | ICD-10-CM | POA: Insufficient documentation

## 2016-05-06 DIAGNOSIS — R05 Cough: Secondary | ICD-10-CM

## 2016-05-06 DIAGNOSIS — R053 Chronic cough: Secondary | ICD-10-CM

## 2016-05-06 MED ORDER — ALBUTEROL SULFATE HFA 108 (90 BASE) MCG/ACT IN AERS: 2.0000 | INHALATION_SPRAY | RESPIRATORY_TRACT | 0 refills | Status: DC | PRN

## 2016-05-06 MED ORDER — RANITIDINE HCL 150 MG PO TABS: 150.0000 mg | ORAL_TABLET | Freq: Two times a day (BID) | ORAL | 5 refills | Status: DC

## 2016-05-06 NOTE — Patient Instructions (Signed)
Take zantac 150 mg twice daily  First dose 30-60 minutes after thyroid medicine and 2nd dose in evening  Avoid spicy foods or anything that sets you off Try albuterol inhaler for wheezing as needed  Chest xray today  Call and update [place] in 2 weeks - if not better we may consider spriometry test

## 2016-05-06 NOTE — Progress Notes (Signed)
Pre visit review using our clinic review tool, if applicable. No additional management support is needed unless otherwise documented below in the visit note. 

## 2016-05-06 NOTE — Progress Notes (Signed)
Subjective:    Patient ID: Katrina [name], female    DOB: 1951-02-08, 66 y.o.   MRN: [id number]  HPI Here for 1 mo of cough   Started mid December  Coughing - worse at night , (not a lot during the day) - just when she wakes up  Now it is starting to hurt to breathe (especially on expiration)- feels wheezy and tight  A sound like gurgling/ wheezing   Now her ear is hurting L and feels full  Not a lot of nasal symptoms  Does clear her throat often   No heartburn  Sometimes he may have an acid taste in mouth - (pizza and peanut butter)  No swelling in throat or mouth or tongue   She does not have a hx of asthma  But in the last year she noticed some wheezing after exercise  Has never used an inhaler   No fever or malaise   Wt Readings from Last 3 Encounters:  05/06/16 166 lb (75.3 kg)  09/28/15 158 lb 8 oz (71.9 kg)  08/07/15 161 lb 8 oz (73.3 kg)     Patient Active Problem List   Diagnosis Date Noted  . Chronic cough 05/06/2016  . Encounter for routine gynecological examination 09/28/2015  . Estrogen deficiency 09/28/2015  . Chronic fatigue 08/07/2015  . Colon cancer screening 03/28/2013  . Routine general medical examination at a health care facility 08/15/2011  . Gynecological examination 08/15/2011  . Post-menopausal 08/15/2011  . Other screening mammogram 01/09/2011  . Osteopenia 01/29/2007  . Hypothyroidism 01/12/2007  . Hyperlipidemia 01/12/2007  . HEEL SPUR 01/12/2007   Past Medical History:  Diagnosis Date  . Heel spur   . HLD (hyperlipidemia)   . Hypothyroid   . Osteopenia    stress fracture in foot  . Plantar fasciitis   . Stress fracture of foot    No past surgical history on file. Social History  Substance Use Topics  . Smoking status: Never Smoker  . Smokeless tobacco: Never Used  . Alcohol use 0.0 oz/week     Comment: Occasional   Family History  Problem Relation Age of Onset  . Myelodysplastic syndrome Father   . Hypertension  Mother   . Thyroid disease Mother   . Cancer      GM--gallbladder  . Lung cancer      GF  . Colon cancer      GF  . Osteoporosis      GM  . Breast cancer Maternal Aunt     mat great aunt   Allergies  Allergen Reactions  . Acetaminophen Other (See Comments)    "hyper"  . Codeine     REACTION: nausea and vomiting   Current Outpatient Prescriptions on File Prior to Visit  Medication Sig Dispense Refill  . Ascorbic Acid (VITAMIN C PO) Take 1 tablet by mouth daily.    Marland Kitchen BIOTIN PO Take 10,000 mcg by mouth daily.     . Calcium-Magnesium-Zinc (CALCIUM-MAGNESUIUM-ZINC PO) Take 1 capsule by mouth daily.    . Cholecalciferol (VITAMIN D3) 2000 units TABS Take 2 tablets by mouth daily.     . Cyanocobalamin (B-12) 5000 MCG CAPS Take 1 capsule by mouth daily.    Marland Kitchen levothyroxine (SYNTHROID, LEVOTHROID) 88 MCG tablet Take 1 tablet (88 mcg total) by mouth daily. 90 tablet 3  . Multiple Vitamin (MULTIVITAMIN) tablet Take 1 tablet by mouth daily.     No current facility-administered medications on file prior to visit.  Review of Systems Review of Systems  Constitutional: Negative for fever, appetite change, fatigue and unexpected weight change.  Eyes: Negative for pain and visual disturbance.  Respiratory: Negative for  shortness of breath.  pos for tight feeling breathing Cardiovascular: Negative for cp or palpitations    Gastrointestinal: Negative for nausea, diarrhea and constipation. neg for heartburn, pos for occ regurgitaion Genitourinary: Negative for urgency and frequency.  Skin: Negative for pallor or rash   Neurological: Negative for weakness, light-headedness, numbness and headaches.  Hematological: Negative for adenopathy. Does not bruise/bleed easily.  Psychiatric/Behavioral: Negative for dysphoric mood. The patient is not nervous/anxious.         Objective:   Physical Exam  Constitutional: She appears well-developed and well-nourished. No distress.  Well appearing     HENT:  Head: Normocephalic and atraumatic.  Mouth/Throat: Oropharynx is clear and moist.  Eyes: Conjunctivae and EOM are normal. Pupils are equal, round, and reactive to light. No scleral icterus.  Neck: Normal range of motion. Neck supple.  Cardiovascular: Normal rate, regular rhythm and normal heart sounds.   Pulmonary/Chest: Effort normal and breath sounds normal. No respiratory distress. She has no wheezes. She has no rales. She exhibits no tenderness.  Good air exch  No wheeze even on forced expiration   Abdominal: Soft. Bowel sounds are normal. She exhibits no distension and no mass. There is no tenderness. There is no rebound and no guarding.  Lymphadenopathy:    She has no cervical adenopathy.  Neurological: She is alert.  Skin: Skin is warm and dry. No erythema. No pallor.  Psychiatric: She has a normal mood and affect.          Assessment & Plan:   Problem List Items Addressed This Visit      Other   Chronic cough    Ongoing over the last mo with hx of 8lb wt gain  Suspect silent reflux most likely  No wheeze on exam but pt reports some at home  Also throat clearing  Will try ranitidine 150 mg bid  Albuterol mdi prn with inst of use  cxr today-pending rad rev  Then update 2 wk regarding status       Relevant Orders   DG Chest 2 View (Completed)

## 2016-05-07 MED ORDER — ALBUTEROL SULFATE HFA 108 (90 BASE) MCG/ACT IN AERS: 2.0000 | INHALATION_SPRAY | RESPIRATORY_TRACT | 5 refills | Status: DC | PRN

## 2016-05-07 NOTE — Addendum Note (Signed)
Addended by: Tammi Sou on: 05/07/2016 03:36 PM   Modules accepted: Orders

## 2016-05-07 NOTE — Assessment & Plan Note (Signed)
Ongoing over the last mo with hx of 8lb wt gain  Suspect silent reflux most likely  No wheeze on exam but pt reports some at home  Also throat clearing  Will try ranitidine 150 mg bid  Albuterol mdi prn with inst of use  cxr today-pending rad rev  Then update 2 wk regarding status

## 2016-05-23 ENCOUNTER — Encounter: Payer: Self-pay | Admitting: Family Medicine

## 2016-05-23 ENCOUNTER — Ambulatory Visit (INDEPENDENT_AMBULATORY_CARE_PROVIDER_SITE_OTHER): Payer: BC Managed Care – PPO | Admitting: Family Medicine

## 2016-05-23 ENCOUNTER — Telehealth: Payer: Self-pay

## 2016-05-23 VITALS — BP 122/76 | HR 65 | Temp 98.2°F | Ht 66.0 in | Wt 167.2 lb

## 2016-05-23 DIAGNOSIS — J0110 Acute frontal sinusitis, unspecified: Secondary | ICD-10-CM | POA: Diagnosis not present

## 2016-05-23 DIAGNOSIS — J019 Acute sinusitis, unspecified: Secondary | ICD-10-CM | POA: Insufficient documentation

## 2016-05-23 MED ORDER — PROMETHAZINE-DM 6.25-15 MG/5ML PO SYRP: 5.0000 mL | ORAL_SOLUTION | Freq: Four times a day (QID) | ORAL | 0 refills | Status: DC | PRN

## 2016-05-23 MED ORDER — AMOXICILLIN-POT CLAVULANATE 875-125 MG PO TABS: 1.0000 | ORAL_TABLET | Freq: Two times a day (BID) | ORAL | 0 refills | Status: DC

## 2016-05-23 NOTE — Progress Notes (Signed)
Subjective:    Patient ID: Katrina [name], female    DOB: [birth year], 66 y.o.   MRN: [id number]  HPI Here for uri symptoms -since the weekend Started with a sore throat - a few days Nasal congestion pnd  Then coughing started-bad fits of coughing - clear Nasal d/c is clear  Some sinus pain frontal  Felt feverish this am    Temp: 98.2 F (36.8 C)   Using cough med - left over zicam Inhaler - albuterol    Was also here last time for chronic cough  Inhaler helped  Acid reflux medicine did not help   Patient Active Problem List   Diagnosis Date Noted  . Acute sinusitis 05/23/2016  . Chronic cough 05/06/2016  . Encounter for routine gynecological examination 09/28/2015  . Estrogen deficiency 09/28/2015  . Chronic fatigue 08/07/2015  . Colon cancer screening 03/28/2013  . Routine general medical examination at a health care facility 08/15/2011  . Gynecological examination 08/15/2011  . Post-menopausal 08/15/2011  . Other screening mammogram 01/09/2011  . Osteopenia 01/29/2007  . Hypothyroidism 01/12/2007  . Hyperlipidemia 01/12/2007  . HEEL SPUR 01/12/2007   Past Medical History:  Diagnosis Date  . Heel spur   . HLD (hyperlipidemia)   . Hypothyroid   . Osteopenia    stress fracture in foot  . Plantar fasciitis   . Stress fracture of foot    No past surgical history on file. Social History  Substance Use Topics  . Smoking status: Never Smoker  . Smokeless tobacco: Never Used  . Alcohol use 0.0 oz/week     Comment: Occasional   Family History  Problem Relation Age of Onset  . Myelodysplastic syndrome Father   . Hypertension Mother   . Thyroid disease Mother   . Cancer      GM--gallbladder  . Lung cancer      GF  . Colon cancer      GF  . Osteoporosis      GM  . Breast cancer Maternal Aunt     mat great aunt   Allergies  Allergen Reactions  . Acetaminophen Other (See Comments)    "hyper"  . Codeine     REACTION: nausea and vomiting    Current Outpatient Prescriptions on File Prior to Visit  Medication Sig Dispense Refill  . albuterol (PROAIR HFA) 108 (90 Base) MCG/ACT inhaler Inhale 2 puffs into the lungs every 4 (four) hours as needed for wheezing or shortness of breath. PROAIR INHALER 1 Inhaler 5  . Ascorbic Acid (VITAMIN C PO) Take 1 tablet by mouth daily.    Marland Kitchen BIOTIN PO Take 10,000 mcg by mouth daily.     . Calcium-Magnesium-Zinc (CALCIUM-MAGNESUIUM-ZINC PO) Take 1 capsule by mouth daily.    . Cholecalciferol (VITAMIN D3) 2000 units TABS Take 2 tablets by mouth daily.     . Cyanocobalamin (B-12) 5000 MCG CAPS Take 1 capsule by mouth daily.    Marland Kitchen levothyroxine (SYNTHROID, LEVOTHROID) 88 MCG tablet Take 1 tablet (88 mcg total) by mouth daily. 90 tablet 3  . Multiple Vitamin (MULTIVITAMIN) tablet Take 1 tablet by mouth daily.     No current facility-administered medications on file prior to visit.     Review of Systems  Constitutional: Positive for appetite change. Negative for fatigue and fever.  HENT: Positive for congestion, ear pain, postnasal drip, rhinorrhea, sinus pressure and sore throat. Negative for nosebleeds.   Eyes: Negative for pain, redness and itching.  Respiratory: Positive for cough.  Negative for shortness of breath and wheezing.   Cardiovascular: Negative for chest pain.  Gastrointestinal: Negative for abdominal pain, diarrhea, nausea and vomiting.  Endocrine: Negative for polyuria.  Genitourinary: Negative for dysuria, frequency and urgency.  Musculoskeletal: Negative for arthralgias and myalgias.  Allergic/Immunologic: Negative for immunocompromised state.  Neurological: Positive for headaches. Negative for dizziness, tremors, syncope, weakness and numbness.  Hematological: Negative for adenopathy. Does not bruise/bleed easily.  Psychiatric/Behavioral: Negative for dysphoric mood. The patient is not nervous/anxious.             Objective:   Physical Exam  Constitutional: She appears  well-developed and well-nourished. No distress.  HENT:  Head: Normocephalic and atraumatic.  Right Ear: External ear normal.  Left Ear: External ear normal.  Mouth/Throat: Oropharynx is clear and moist. No oropharyngeal exudate.  Nares are injected and congested  Bilateral frontal sinus tenderness  Post nasal drip   Eyes: Conjunctivae and EOM are normal. Pupils are equal, round, and reactive to light. Right eye exhibits no discharge. Left eye exhibits no discharge.  Neck: Normal range of motion. Neck supple.  Cardiovascular: Normal rate and regular rhythm.   Pulmonary/Chest: Effort normal and breath sounds normal. No respiratory distress. She has no wheezes. She has no rales.  Lymphadenopathy:    She has no cervical adenopathy.  Neurological: She is alert. No cranial nerve deficit.  Skin: Skin is warm and dry. No rash noted.  Psychiatric: She has a normal mood and affect.          Assessment & Plan:   Problem List Items Addressed This Visit      Respiratory   Acute sinusitis    S/p uri  Cover with augmentin  prometh DM for cough  Zyrtec prn for runny nose/pnd  Disc symptomatic care - see instructions on AVS  Update if not starting to improve in a week or if worsening        Relevant Medications   promethazine-dextromethorphan (PROMETHAZINE-DM) 6.25-15 MG/5ML syrup   amoxicillin-clavulanate (AUGMENTIN) 875-125 MG tablet

## 2016-05-23 NOTE — Progress Notes (Signed)
Pre visit review using our clinic review tool, if applicable. No additional management support is needed unless otherwise documented below in the visit note. 

## 2016-05-23 NOTE — Patient Instructions (Signed)
Try zyrtec 10 mg daily for allergies / runny nose  I sent prometh-dm for cough  Take the augmentin for sinus infection  Drink lots of fluids Rest  Nasal saline spray - nose / helps clear sinuses  Breathe steam   Update if not starting to improve in a week or if worsening

## 2016-05-23 NOTE — Telephone Encounter (Signed)
Pt left v/m; pt has S/T, H/A, lost voice for 2 days, ? Fever, a lot of sinus type drainage; coughing a lot pt taking Zicam and cough med and inhaler recently prescribed when seen 05/06/16. Pt also giving update when seen 05/06/16; the anti acid did not help and the inhaler helped some; pt no longer has heaviiness in chest.pt is at work and cannot leave until 3 pm. No available appts at [facility] last available appt is 3 pm today. Pt ask if med could be called in to [address]

## 2016-05-23 NOTE — Telephone Encounter (Signed)
Please put her in my 4:15 slot  Thanks

## 2016-05-23 NOTE — Telephone Encounter (Signed)
Left voicemail requesting pt to call and confirm if she can come in at 4:15pm today

## 2016-05-25 NOTE — Assessment & Plan Note (Signed)
S/p uri  Cover with augmentin  prometh DM for cough  Zyrtec prn for runny nose/pnd  Disc symptomatic care - see instructions on AVS  Update if not starting to improve in a week or if worsening

## 2016-06-05 ENCOUNTER — Other Ambulatory Visit: Payer: Self-pay | Admitting: Family Medicine

## 2016-06-05 DIAGNOSIS — Z1231 Encounter for screening mammogram for malignant neoplasm of breast: Secondary | ICD-10-CM

## 2016-07-16 ENCOUNTER — Ambulatory Visit
Admission: RE | Admit: 2016-07-16 | Discharge: 2016-07-16 | Disposition: A | Discharge status: Home / Self Care | Payer: BC Managed Care – PPO | Source: Ambulatory Visit | Attending: Family Medicine | Admitting: Family Medicine

## 2016-07-16 DIAGNOSIS — Z1231 Encounter for screening mammogram for malignant neoplasm of breast: Secondary | ICD-10-CM | POA: Insufficient documentation

## 2016-09-27 ENCOUNTER — Telehealth: Payer: Self-pay | Admitting: Family Medicine

## 2016-09-27 DIAGNOSIS — Z0000 Encounter for general adult medical examination without abnormal findings: Secondary | ICD-10-CM

## 2016-09-27 NOTE — Telephone Encounter (Signed)
-----   Message from Ellamae Sia sent at 09/23/2016 11:13 AM EDT ----- Regarding: Lab orders for Thursday, 6.28.18 Patient is scheduled for CPX labs, please order future labs, Thanks , Karna Christmas

## 2016-09-29 ENCOUNTER — Other Ambulatory Visit: Payer: BC Managed Care – PPO

## 2016-10-01 ENCOUNTER — Encounter: Payer: BC Managed Care – PPO | Admitting: Family Medicine

## 2016-10-02 ENCOUNTER — Other Ambulatory Visit (INDEPENDENT_AMBULATORY_CARE_PROVIDER_SITE_OTHER): Payer: BC Managed Care – PPO

## 2016-10-02 DIAGNOSIS — Z0000 Encounter for general adult medical examination without abnormal findings: Secondary | ICD-10-CM | POA: Diagnosis not present

## 2016-10-02 LAB — COMPREHENSIVE METABOLIC PANEL
ALT: 15 U/L (ref 0–35)
AST: 21 U/L (ref 0–37)
Albumin: 4 g/dL (ref 3.5–5.2)
Alkaline Phosphatase: 79 U/L (ref 39–117)
BUN: 10 mg/dL (ref 6–23)
CO2: 30 mEq/L (ref 19–32)
Calcium: 9.7 mg/dL (ref 8.4–10.5)
Chloride: 100 mEq/L (ref 96–112)
Creatinine, Ser: 0.99 mg/dL (ref 0.40–1.20)
GFR: 60 mL/min — ABNORMAL LOW (ref >60.00)
Glucose, Bld: 99 mg/dL (ref 70–99)
Potassium: 4.4 mEq/L (ref 3.5–5.1)
Sodium: 137 mEq/L (ref 135–145)
Total Bilirubin: 0.8 mg/dL (ref 0.2–1.2)
Total Protein: 6.9 g/dL (ref 6.0–8.3)

## 2016-10-02 LAB — CBC WITH DIFFERENTIAL/PLATELET
Basophils Absolute: 0.1 10*3/uL (ref 0.0–0.1)
Basophils Relative: 1 % (ref 0.0–3.0)
Eosinophils Absolute: 0.2 10*3/uL (ref 0.0–0.7)
Eosinophils Relative: 4.4 % (ref 0.0–5.0)
HCT: 43.3 % (ref 36.0–46.0)
Hemoglobin: 14.7 g/dL (ref 12.0–15.0)
Lymphocytes Relative: 39.9 % (ref 12.0–46.0)
Lymphs Abs: 2 10*3/uL (ref 0.7–4.0)
MCHC: 33.9 g/dL (ref 30.0–36.0)
MCV: 94.2 fl (ref 78.0–100.0)
Monocytes Absolute: 0.5 10*3/uL (ref 0.1–1.0)
Monocytes Relative: 9.8 % (ref 3.0–12.0)
Neutro Abs: 2.3 10*3/uL (ref 1.4–7.7)
Neutrophils Relative %: 44.9 % (ref 43.0–77.0)
Platelets: 294 10*3/uL (ref 150.0–400.0)
RBC: 4.6 Mil/uL (ref 3.87–5.11)
RDW: 12.6 % (ref 11.5–15.5)
WBC: 5 10*3/uL (ref 4.0–10.5)

## 2016-10-02 LAB — LIPID PANEL
Cholesterol: 239 mg/dL — ABNORMAL HIGH (ref 0–200)
HDL: 58.1 mg/dL (ref >39.00)
LDL Cholesterol: 156 mg/dL — ABNORMAL HIGH (ref 0–99)
NonHDL: 180.96
Total CHOL/HDL Ratio: 4
Triglycerides: 126 mg/dL (ref 0.0–149.0)
VLDL: 25.2 mg/dL (ref 0.0–40.0)

## 2016-10-02 LAB — TSH: TSH: 4.04 u[IU]/mL (ref 0.35–4.50)

## 2016-10-07 ENCOUNTER — Encounter: Payer: Self-pay | Admitting: Family Medicine

## 2016-10-07 ENCOUNTER — Ambulatory Visit (INDEPENDENT_AMBULATORY_CARE_PROVIDER_SITE_OTHER): Payer: BC Managed Care – PPO | Admitting: Family Medicine

## 2016-10-07 ENCOUNTER — Encounter: Payer: BC Managed Care – PPO | Admitting: Family Medicine

## 2016-10-07 VITALS — BP 110/66 | HR 61 | Temp 98.3°F | Ht 66.25 in | Wt 162.5 lb

## 2016-10-07 DIAGNOSIS — E7800 Pure hypercholesterolemia, unspecified: Secondary | ICD-10-CM

## 2016-10-07 DIAGNOSIS — Z1211 Encounter for screening for malignant neoplasm of colon: Secondary | ICD-10-CM | POA: Diagnosis not present

## 2016-10-07 DIAGNOSIS — M8580 Other specified disorders of bone density and structure, unspecified site: Secondary | ICD-10-CM | POA: Diagnosis not present

## 2016-10-07 DIAGNOSIS — E039 Hypothyroidism, unspecified: Secondary | ICD-10-CM | POA: Diagnosis not present

## 2016-10-07 DIAGNOSIS — Z0000 Encounter for general adult medical examination without abnormal findings: Secondary | ICD-10-CM

## 2016-10-07 DIAGNOSIS — E785 Hyperlipidemia, unspecified: Secondary | ICD-10-CM

## 2016-10-07 DIAGNOSIS — Z0001 Encounter for general adult medical examination with abnormal findings: Secondary | ICD-10-CM | POA: Diagnosis not present

## 2016-10-07 MED ORDER — LEVOTHYROXINE SODIUM 88 MCG PO TABS: 88.0000 ug | ORAL_TABLET | Freq: Every day | ORAL | 3 refills | Status: DC

## 2016-10-07 NOTE — Assessment & Plan Note (Signed)
She will have to have her screening colonoscopy at an endo center in [place] due to ins  She will call back with the name to get that referral

## 2016-10-07 NOTE — Patient Instructions (Addendum)
Call us back for a colonoscopy referral when you are ready and give [place] the name of the place you need to go   For cholesterol :   Avoid red meat/ fried foods/ egg yolks/ fatty breakfast meats/ butter, cheese and high fat dairy/ and shellfish    Keep working on healthy habits

## 2016-10-07 NOTE — Assessment & Plan Note (Signed)
Hypothyroidism  Pt has no clinical changes No change in energy level/ hair or skin/ edema and no tremor Lab Results  Component Value Date   TSH 4.04 10/02/2016

## 2016-10-07 NOTE — Assessment & Plan Note (Signed)
Disc goals for lipids and reasons to control them Rev labs with pt Rev low sat fat diet in detail LDL in 150s Likely genetic but pt does not want to start medicine  Info given on low cholesterol diet and she will continue to work on it

## 2016-10-07 NOTE — Assessment & Plan Note (Signed)
Pt's insurance will not pay for dexa -will schedule when she gets medicare No falls or fractures Disc need for calcium/ vitamin D/ wt bearing exercise and bone density test every 2 y to monitor Disc safety/ fracture risk in detail

## 2016-10-07 NOTE — Assessment & Plan Note (Signed)
Reviewed health habits including diet and exercise and skin cancer prevention Reviewed appropriate screening tests for age  Also reviewed health mt list, fam hx and immunization status , as well as social and family history   See HPI Labs reviewed  Disc better diet for cholesterol control (she declines medication)

## 2016-10-07 NOTE — Progress Notes (Signed)
Subjective:    Patient ID: Katrina [name], female    DOB: 12/01/1950, 66 y.o.   MRN: [id number]  HPI Here for health maintenance exam and to review chronic medical problems    Just retired -very happy about it  Will likely get a job at [employer] for 8 yo mother- they go to the gym together   [employer] from Last 3 Encounters:  10/07/16 162 lb 8 oz (73.7 kg)  05/23/16 167 lb 4 oz (75.9 kg)  05/06/16 166 lb (75.3 kg)  she is exercising  bmi 26.0  Colon cancer screening- she was referred last year for colonoscopy and she could not fit it in   She has to go to a place in [place]- will call back with the name of the place   Mammogram 4/18-nl  Self exam-no breast lumps   Pap 6/17 neg with neg HPV screen No symptoms / no new partners  No vaginal bleeding  Hot natured-that has not changed   Tetanus shot 5/13  dexa 6/13 osteopenia She found out her insurance will not pay (will have to wait until medicare)  No falls or fractures  Takes her ca and D (she inc her D to 5000 iu per day) -feels better with that   Hypothyroidism  Pt has no clinical changes- feels much better on her levothyroxine  No change in energy level/ hair or skin/ edema and no tremor Lab Results  Component Value Date   TSH 4.04 10/02/2016     Zoster status - will be interested in the vaccine when it is avail   Hyperlipidemia Lab Results  Component Value Date   CHOL 239 (H) 10/02/2016   CHOL 240 (H) 09/28/2015   CHOL 241 (H) 04/25/2014   Lab Results  Component Value Date   HDL 58.10 10/02/2016   HDL 54.90 09/28/2015   HDL 60.80 04/25/2014   Lab Results  Component Value Date   LDLCALC 156 (H) 10/02/2016   LDLCALC 164 (H) 09/28/2015   LDLCALC 159 (H) 04/25/2014   Lab Results  Component Value Date   TRIG 126.0 10/02/2016   TRIG 106.0 09/28/2015   TRIG 106.0 04/25/2014   Lab Results  Component Value Date   CHOLHDL 4 10/02/2016   CHOLHDL 4 09/28/2015   CHOLHDL 4 04/25/2014   Lab  Results  Component Value Date   LDLDIRECT 184.1 03/28/2013   LDLDIRECT 181.2 08/15/2011   LDLDIRECT 177.6 04/02/2010   Eats a healthy diet  Had just come back from a trip however  She wants to keep working on diet for cholesterol   Results for orders placed or performed in visit on 10/02/16  CBC with Differential/Platelet  Result Value Ref Range   WBC 5.0 4.0 - 10.5 K/uL   RBC 4.60 3.87 - 5.11 Mil/uL   Hemoglobin 14.7 12.0 - 15.0 g/dL   HCT 43.3 36.0 - 46.0 %   MCV 94.2 78.0 - 100.0 fl   MCHC 33.9 30.0 - 36.0 g/dL   RDW 12.6 11.5 - 15.5 %   Platelets 294.0 150.0 - 400.0 K/uL   Neutrophils Relative % 44.9 43.0 - 77.0 %   Lymphocytes Relative 39.9 12.0 - 46.0 %   Monocytes Relative 9.8 3.0 - 12.0 %   Eosinophils Relative 4.4 0.0 - 5.0 %   Basophils Relative 1.0 0.0 - 3.0 %   Neutro Abs 2.3 1.4 - 7.7 K/uL   Lymphs Abs 2.0 0.7 - 4.0 K/uL   Monocytes Absolute  0.5 0.1 - 1.0 K/uL   Eosinophils Absolute 0.2 0.0 - 0.7 K/uL   Basophils Absolute 0.1 0.0 - 0.1 K/uL  Comprehensive metabolic panel  Result Value Ref Range   Sodium 137 135 - 145 mEq/L   Potassium 4.4 3.5 - 5.1 mEq/L   Chloride 100 96 - 112 mEq/L   CO2 30 19 - 32 mEq/L   Glucose, Bld 99 70 - 99 mg/dL   BUN 10 6 - 23 mg/dL   Creatinine, Ser 0.99 0.40 - 1.20 mg/dL   Total Bilirubin 0.8 0.2 - 1.2 mg/dL   Alkaline Phosphatase 79 39 - 117 U/L   AST 21 0 - 37 U/L   ALT 15 0 - 35 U/L   Total Protein 6.9 6.0 - 8.3 g/dL   Albumin 4.0 3.5 - 5.2 g/dL   Calcium 9.7 8.4 - 10.5 mg/dL   GFR 60.00 (L) >60.00 mL/min  Lipid panel  Result Value Ref Range   Cholesterol 239 (H) 0 - 200 mg/dL   Triglycerides 126.0 0.0 - 149.0 mg/dL   HDL 58.10 >39.00 mg/dL   VLDL 25.2 0.0 - 40.0 mg/dL   LDL Cholesterol 156 (H) 0 - 99 mg/dL   Total CHOL/HDL Ratio 4    NonHDL 180.96   TSH  Result Value Ref Range   TSH 4.04 0.35 - 4.50 uIU/mL    Dep screening nl - good mood   Patient Active Problem List   Diagnosis Date Noted  . Encounter for  routine gynecological examination 09/28/2015  . Estrogen deficiency 09/28/2015  . Colon cancer screening 03/28/2013  . Routine general medical examination at a health care facility 08/15/2011  . Gynecological examination 08/15/2011  . Post-menopausal 08/15/2011  . Other screening mammogram 01/09/2011  . Osteopenia 01/29/2007  . Hypothyroidism 01/12/2007  . Hyperlipidemia 01/12/2007  . HEEL SPUR 01/12/2007   Past Medical History:  Diagnosis Date  . Heel spur   . HLD (hyperlipidemia)   . Hypothyroid   . Osteopenia    stress fracture in foot  . Plantar fasciitis   . Stress fracture of foot    No past surgical history on file. Social History  Substance Use Topics  . Smoking status: Never Smoker  . Smokeless tobacco: Never Used  . Alcohol use 0.0 oz/week     Comment: Occasional   Family History  Problem Relation Age of Onset  . Myelodysplastic syndrome Father   . Hypertension Mother   . Thyroid disease Mother   . Cancer Unknown        GM--gallbladder  . Lung cancer Unknown        GF  . Colon cancer Unknown        GF  . Osteoporosis Unknown        GM  . Breast cancer Neg Hx    Allergies  Allergen Reactions  . Acetaminophen Other (See Comments)    "hyper"  . Codeine     REACTION: nausea and vomiting   Current Outpatient Prescriptions on File Prior to Visit  Medication Sig Dispense Refill  . Ascorbic Acid (VITAMIN C PO) Take 1 tablet by mouth daily.    . Calcium-Magnesium-Zinc (CALCIUM-MAGNESUIUM-ZINC PO) Take 1 capsule by mouth daily.    . Cholecalciferol (VITAMIN D3) 2000 units TABS Take 2 tablets by mouth daily.     . Multiple Vitamin (MULTIVITAMIN) tablet Take 1 tablet by mouth daily.     No current facility-administered medications on file prior to visit.     Review of  Systems Review of Systems  Constitutional: Negative for fever, appetite change, fatigue and unexpected weight change.  Eyes: Negative for pain and visual disturbance.  Respiratory: Negative  for cough and shortness of breath.   Cardiovascular: Negative for cp or palpitations    Gastrointestinal: Negative for nausea, diarrhea and constipation.  Genitourinary: Negative for urgency and frequency.  Skin: Negative for pallor or rash   Neurological: Negative for weakness, light-headedness, numbness and headaches.  Hematological: Negative for adenopathy. Does not bruise/bleed easily.  Psychiatric/Behavioral: Negative for dysphoric mood. The patient is not nervous/anxious.         Objective:   Physical Exam  Constitutional: She appears well-developed and well-nourished. No distress.  Well appearing   HENT:  Head: Normocephalic and atraumatic.  Right Ear: External ear normal.  Left Ear: External ear normal.  Mouth/Throat: Oropharynx is clear and moist.  Eyes: Conjunctivae and EOM are normal. Pupils are equal, round, and reactive to light. No scleral icterus.  Neck: Normal range of motion. Neck supple. No JVD present. Carotid bruit is not present. No thyromegaly present.  Cardiovascular: Normal rate, regular rhythm, normal heart sounds and intact distal pulses.  Exam reveals no gallop.   Pulmonary/Chest: Effort normal and breath sounds normal. No respiratory distress. She has no wheezes. She exhibits no tenderness.  Abdominal: Soft. Bowel sounds are normal. She exhibits no distension, no abdominal bruit and no mass. There is no tenderness.  Genitourinary: No breast swelling, tenderness, discharge or bleeding.  Genitourinary Comments: Breast exam: No mass, nodules, thickening, tenderness, bulging, retraction, inflamation, nipple discharge or skin changes noted.  No axillary or clavicular LA.      Musculoskeletal: Normal range of motion. She exhibits no edema or tenderness.  Lymphadenopathy:    She has no cervical adenopathy.  Neurological: She is alert. She has normal reflexes. No cranial nerve deficit. She exhibits normal muscle tone. Coordination normal.  Skin: Skin is warm and  dry. No rash noted. No erythema. No pallor.  Solar lentigines diffusely   Psychiatric: She has a normal mood and affect.          Assessment & Plan:   Problem List Items Addressed This Visit      Endocrine   Hypothyroidism - Primary    Hypothyroidism  Pt has no clinical changes No change in energy level/ hair or skin/ edema and no tremor Lab Results  Component Value Date   TSH 4.04 10/02/2016           Relevant Medications   levothyroxine (SYNTHROID, LEVOTHROID) 88 MCG tablet     Musculoskeletal and Integument   Osteopenia    Pt's insurance will not pay for dexa -will schedule when she gets medicare No falls or fractures Disc need for calcium/ vitamin D/ wt bearing exercise and bone density test every 2 y to monitor Disc safety/ fracture risk in detail          Other   Colon cancer screening    She will have to have her screening colonoscopy at an endo center in [place] due to ins  She will call back with the name to get that referral        Hyperlipidemia    Disc goals for lipids and reasons to control them Rev labs with pt Rev low sat fat diet in detail LDL in 150s Likely genetic but pt does not want to start medicine  Info given on low cholesterol diet and she will continue to work on it  Routine general medical examination at a health care facility    Reviewed health habits including diet and exercise and skin cancer prevention Reviewed appropriate screening tests for age  Also reviewed health mt list, fam hx and immunization status , as well as social and family history   See HPI Labs reviewed  Disc better diet for cholesterol control (she declines medication)

## 2017-06-10 ENCOUNTER — Ambulatory Visit: Payer: Self-pay

## 2017-06-10 NOTE — Telephone Encounter (Signed)
Pt. States diarrhea started last Friday. Stool is watery with mucus. No blood. Having cramping. Denies fever but has had chills. Denies nausea.In [place] 2 weeks ago. Staying hydrated and is following a bland diet. Appointment for tomorrow. Reason for Disposition . [1] Mucus or pus in stool AND [2] present > 2 days AND [3] diarrhea is more than mild  Answer Assessment - Initial Assessment Questions 1. DIARRHEA SEVERITY: "How bad is the diarrhea?" "How many extra stools have you had in the past 24 hours than normal?"    - MILD: Few loose or mushy BMs; increase of 1-3 stools over normal daily number of stools; mild increase in ostomy output.   - MODERATE: Increase of 4-6 stools daily over normal; moderate increase in ostomy output.   - SEVERE (or Worst Possible): Increase of 7 or more stools daily over normal; moderate increase in ostomy output; incontinence.     5-6 2. ONSET: "When did the diarrhea begin?"      Started last Friday 3. BM CONSISTENCY: "How loose or watery is the diarrhea?"      Loose with mucous 4. VOMITING: "Are you also vomiting?" If so, ask: "How many times in the past 24 hours?"      No 5. ABDOMINAL PAIN: "Are you having any abdominal pain?" If yes: "What does it feel like?" (e.g., crampy, dull, intermittent, constant)      Cramping 6. ABDOMINAL PAIN SEVERITY: If present, ask: "How bad is the pain?"  (e.g., Scale 1-10; mild, moderate, or severe)    - MILD (1-3): doesn't interfere with normal activities, abdomen soft and not tender to touch     - MODERATE (4-7): interferes with normal activities or awakens from sleep, tender to touch     - SEVERE (8-10): excruciating pain, doubled over, unable to do any normal activities       8-9 7. ORAL INTAKE: If vomiting, "Have you been able to drink liquids?" "How much fluids have you had in the past 24 hours?"     Drinking well 8. HYDRATION: "Any signs of dehydration?" (e.g., dry mouth [not just dry lips], too weak to stand, dizziness,  new weight loss) "When did you last urinate?"     No 9. EXPOSURE: "Have you traveled to a foreign country recently?" "Have you been exposed to anyone with diarrhea?" "Could you have eaten any food that was spoiled?"     [place] 2 weeks  10. OTHER SYMPTOMS: "Do you have any other symptoms?" (e.g., fever, blood in stool)       No fever, but chills 11. PREGNANCY: "Is there any chance you are pregnant?" "When was your last menstrual period?"       No  Protocols used: DIARRHEA-A-AH

## 2017-06-11 ENCOUNTER — Encounter: Payer: Self-pay | Admitting: Primary Care

## 2017-06-11 ENCOUNTER — Ambulatory Visit: Payer: BC Managed Care – PPO | Admitting: Primary Care

## 2017-06-11 VITALS — BP 104/70 | HR 63 | Temp 97.8°F | Ht 66.25 in | Wt 153.0 lb

## 2017-06-11 DIAGNOSIS — R197 Diarrhea, unspecified: Secondary | ICD-10-CM

## 2017-06-11 LAB — COMPREHENSIVE METABOLIC PANEL
ALT: 8 U/L (ref 0–35)
AST: 12 U/L (ref 0–37)
Albumin: 3.7 g/dL (ref 3.5–5.2)
Alkaline Phosphatase: 65 U/L (ref 39–117)
BUN: 9 mg/dL (ref 6–23)
CO2: 29 mEq/L (ref 19–32)
Calcium: 9.2 mg/dL (ref 8.4–10.5)
Chloride: 99 mEq/L (ref 96–112)
Creatinine, Ser: 0.93 mg/dL (ref 0.40–1.20)
GFR: 64.35 mL/min (ref >60.00)
Glucose, Bld: 100 mg/dL — ABNORMAL HIGH (ref 70–99)
Potassium: 3.8 mEq/L (ref 3.5–5.1)
Sodium: 135 mEq/L (ref 135–145)
Total Bilirubin: 0.8 mg/dL (ref 0.2–1.2)
Total Protein: 6.8 g/dL (ref 6.0–8.3)

## 2017-06-11 LAB — CBC WITH DIFFERENTIAL/PLATELET
Basophils Absolute: 0 10*3/uL (ref 0.0–0.1)
Basophils Relative: 0.9 % (ref 0.0–3.0)
Eosinophils Absolute: 0.2 10*3/uL (ref 0.0–0.7)
Eosinophils Relative: 3.6 % (ref 0.0–5.0)
HCT: 43.4 % (ref 36.0–46.0)
Hemoglobin: 14.5 g/dL (ref 12.0–15.0)
Lymphocytes Relative: 38.3 % (ref 12.0–46.0)
Lymphs Abs: 1.6 10*3/uL (ref 0.7–4.0)
MCHC: 33.5 g/dL (ref 30.0–36.0)
MCV: 92.6 fl (ref 78.0–100.0)
Monocytes Absolute: 0.9 10*3/uL (ref 0.1–1.0)
Monocytes Relative: 21.2 % — ABNORMAL HIGH (ref 3.0–12.0)
Neutro Abs: 1.5 10*3/uL (ref 1.4–7.7)
Neutrophils Relative %: 36 % — ABNORMAL LOW (ref 43.0–77.0)
Platelets: 286 10*3/uL (ref 150.0–400.0)
RBC: 4.69 Mil/uL (ref 3.87–5.11)
RDW: 13 % (ref 11.5–15.5)
WBC: 4.2 10*3/uL (ref 4.0–10.5)

## 2017-06-11 NOTE — Progress Notes (Signed)
Subjective:    Patient ID: Katrina Katrina [name], female    DOB: October 16, 1950, 67 y.o.   MRN: [id number]  HPI  Katrina Katrina [name] is a 67 year old female who presents today with a chief complaint of diarrhea.   She also reports abdominal pain, bloating, fatigue, chills, nausea. Her abdominal pain is located to the bilateral lower quadrants for which she describes as an intermittent cramping feeling, this occurred after her diarrhea began. Her symptoms began 6 days ago.   She's experiencing 15-20 episodes of diarrhea daily, has started to feel better today. She's noticed a pinkish blood tinge on the toilet paper, also some in the toilet bowl. She's done some traveling internationally and domestically within the last several months.   She's taken Imodium on four different days, 1-2 doses max with improvement. She denies fevers. No one else in her household has these symptoms. She's had little to no appetite and is eating small amounts of eggs, potatoes, soup. She's been drinking water and ginger ale. Today she's noticed that she's feeling Katrina [name] better.   Review of Systems  Constitutional: Positive for chills and fatigue. Negative for fever.  Cardiovascular: Negative for palpitations.  Gastrointestinal: Positive for abdominal pain, diarrhea and nausea.  Neurological: Negative for weakness.       Past Medical History:  Diagnosis Date  . Heel spur   . HLD (hyperlipidemia)   . Hypothyroid   . Osteopenia    stress fracture in foot  . Plantar fasciitis   . Stress fracture of foot      Social History   Socioeconomic History  . Marital status: Married    Spouse name: Not on file  . Number of children: 4  . Years of education: Not on file  . Highest education level: Not on file  Social Needs  . Financial resource strain: Not on file  . Food insecurity - worry: Not on file  . Food insecurity - inability: Not on file  . Transportation needs - medical: Not on file  . Transportation needs -  non-medical: Not on file  Occupational History  . Occupation: [employer]  Tobacco Use  . Smoking status: Never Smoker  . Smokeless tobacco: Never Used  Substance and Sexual Activity  . Alcohol use: Yes    Alcohol/week: 0.0 oz    Comment: Occasional  . Drug use: No  . Sexual activity: Not on file  Other Topics Concern  . Not on file  Social History Narrative   Married      4 children      [place]    No past surgical history on file.  Family History  Problem Relation Age of Onset  . Myelodysplastic syndrome Father   . Hypertension Mother   . Thyroid disease Mother   . Cancer Unknown        GM--gallbladder  . Lung cancer Unknown        GF  . Colon cancer Unknown        GF  . Osteoporosis Unknown        GM  . Breast cancer Neg Hx     Allergies  Allergen Reactions  . Acetaminophen Other (See Comments)    "hyper"  . Codeine     REACTION: nausea and vomiting    Current Outpatient Medications on File Prior to Visit  Medication Sig Dispense Refill  . Ascorbic Acid (VITAMIN C PO) Take 1 tablet by mouth daily.    . Calcium-Magnesium-Zinc (  CALCIUM-MAGNESUIUM-ZINC PO) Take 1 capsule by mouth daily.    . Cholecalciferol (VITAMIN D3) 2000 units TABS Take 2 tablets by mouth daily.     Marland Kitchen levothyroxine (SYNTHROID, LEVOTHROID) 88 MCG tablet Take 1 tablet (88 mcg total) by mouth daily. 90 tablet 3  . Multiple Vitamin (MULTIVITAMIN) tablet Take 1 tablet by mouth daily.     No current facility-administered medications on file prior to visit.     BP 104/70   Pulse 63   Temp 97.8 F (36.6 C) (Oral)   Ht 5' 6.25" (1.683 m)   Wt 153 lb (69.4 kg)   SpO2 98%   BMI 24.51 kg/m    Objective:   Physical Exam  Constitutional: She appears well-nourished.  Neck: Neck supple.  Cardiovascular: Normal rate and regular rhythm.  Pulmonary/Chest: Effort normal and breath sounds normal.  Abdominal: Soft. Bowel sounds are normal. There is generalized  tenderness. There is no guarding and negative Murphy's sign.  Mild generalized tenderness  Skin: Skin is warm and dry.          Assessment & Plan:  Diarrhea:  Present for the past 6 days, also with abdominal cramping, chills, nausea.  Overall feeling better today. Exam overall stable, vitals WNL. Check CBC, CMP, Stool studies. Suspect viral etiology mostly, consider salmonella vs bacterial. Discussed importance of hydration, advance diet as tolerated.  She is stable for outpatient treatment.   Pleas Koch, NP

## 2017-06-11 NOTE — Addendum Note (Signed)
Addended by: Ellamae Sia on: 06/11/2017 12:15 PM   Modules accepted: Orders

## 2017-06-11 NOTE — Addendum Note (Signed)
Addended by: Ellamae Sia on: 06/11/2017 03:43 PM   Modules accepted: Orders

## 2017-06-11 NOTE — Patient Instructions (Signed)
Stop by the lab prior to leaving today. I will notify you of your results once received.   It's very important to work on hydration with liquids, including water.  Advance your diet as tolerated. Take a look at the information below.  It was a pleasure meeting you!   Food Choices to Help Relieve Diarrhea, Adult When you have diarrhea, the foods you eat and your eating habits are very important. Choosing the right foods and drinks can help:  Relieve diarrhea.  Replace lost fluids and nutrients.  Prevent dehydration.  What general guidelines should I follow? Relieving diarrhea  Choose foods with less than 2 g or .07 oz. of fiber per serving.  Limit fats to less than 8 tsp (38 g or 1.34 oz.) a day.  Avoid the following: ? Foods and beverages sweetened with high-fructose corn syrup, honey, or sugar alcohols such as xylitol, sorbitol, and mannitol. ? Foods that contain a lot of fat or sugar. ? Fried, greasy, or spicy foods. ? High-fiber grains, breads, and cereals. ? Raw fruits and vegetables.  Eat foods that are rich in probiotics. These foods include dairy products such as yogurt and fermented milk products. They help increase healthy bacteria in the stomach and intestines (gastrointestinal tract, or GI tract).  If you have lactose intolerance, avoid dairy products. These may make your diarrhea worse.  Take medicine to help stop diarrhea (antidiarrheal medicine) only as told by your health care provider. Replacing nutrients  Eat small meals or snacks every 3-4 hours.  Eat bland foods, such as white rice, toast, or baked potato, until your diarrhea starts to get better. Gradually reintroduce nutrient-rich foods as tolerated or as told by your health care provider. This includes: ? Well-cooked protein foods. ? Peeled, seeded, and soft-cooked fruits and vegetables. ? Low-fat dairy products.  Take vitamin and mineral supplements as told by your health care provider. Preventing  dehydration   Start by sipping water or a special solution to prevent dehydration (oral rehydration solution, ORS). Urine that is clear or pale yellow means that you are getting enough fluid.  Try to drink at least 8-10 cups of fluid each day to help replace lost fluids.  You may add other liquids in addition to water, such as clear juice or decaffeinated sports drinks, as tolerated or as told by your health care provider.  Avoid drinks with caffeine, such as coffee, tea, or soft drinks.  Avoid alcohol. What foods are recommended? The items listed may not be a complete list. Talk with your health care provider about what dietary choices are best for you. Grains White rice. White, [place], or pita breads (fresh or toasted), including plain rolls, buns, or bagels. White pasta. Saltine, soda, or graham crackers. Pretzels. Low-fiber cereal. Cooked cereals made with water (such as cornmeal, farina, or cream cereals). Plain muffins. Matzo. Melba toast. Zwieback. Vegetables Potatoes (without the skin). Most well-cooked and canned vegetables without skins or seeds. Tender lettuce. Fruits Apple sauce. Fruits canned in juice. Cooked apricots, cherries, grapefruit, peaches, pears, or plums. Fresh bananas and cantaloupe. Meats and other protein foods Baked or boiled chicken. Eggs. Tofu. Fish. Seafood. Smooth nut butters. Ground or well-cooked tender beef, ham, veal, lamb, pork, or poultry. Dairy Plain yogurt, kefir, and unsweetened liquid yogurt. Lactose-free milk, buttermilk, skim milk, or soy milk. Low-fat or nonfat hard cheese. Beverages Water. Low-calorie sports drinks. Fruit juices without pulp. Strained tomato and vegetable juices. Decaffeinated teas. Sugar-free beverages not sweetened with sugar alcohols. Oral rehydration solutions,  if approved by your health care provider. Seasoning and other foods Bouillon, broth, or soups made from recommended foods. What foods are not recommended? The  items listed may not be a complete list. Talk with your health care provider about what dietary choices are best for you. Grains Whole grain, whole wheat, bran, or rye breads, rolls, pastas, and crackers. Wild or brown rice. Whole grain or bran cereals. Barley. Oats and oatmeal. Corn tortillas or taco shells. Granola. Popcorn. Vegetables Raw vegetables. Fried vegetables. Cabbage, broccoli, Brussels sprouts, artichokes, baked beans, beet greens, corn, kale, legumes, peas, sweet potatoes, and yams. Potato skins. Cooked spinach and cabbage. Fruits Dried fruit, including raisins and dates. Raw fruits. Stewed or dried prunes. Canned fruits with syrup. Meat and other protein foods Fried or fatty meats. Deli meats. Chunky nut butters. Nuts and seeds. Beans and lentils. Berniece Salines. Hot dogs. Sausage. Dairy High-fat cheeses. Whole milk, chocolate milk, and beverages made with milk, such as milk shakes. Half-and-half. Cream. sour cream. Ice cream. Beverages Caffeinated beverages (such as coffee, tea, soda, or energy drinks). Alcoholic beverages. Fruit juices with pulp. Prune juice. Soft drinks sweetened with high-fructose corn syrup or sugar alcohols. High-calorie sports drinks. Fats and oils Butter. Cream sauces. Margarine. Salad oils. Plain salad dressings. Olives. Avocados. Mayonnaise. Sweets and desserts Sweet rolls, doughnuts, and sweet breads. Sugar-free desserts sweetened with sugar alcohols such as xylitol and sorbitol. Seasoning and other foods Honey. Hot sauce. Chili powder. Gravy. Cream-based or milk-based soups. Pancakes and waffles. Summary  When you have diarrhea, the foods you eat and your eating habits are very important.  Make sure you get at least 8-10 cups of fluid each day, or enough to keep your urine clear or pale yellow.  Eat bland foods and gradually reintroduce healthy, nutrient-rich foods as tolerated, or as told by your health care provider.  Avoid high-fiber, fried, greasy, or  spicy foods. This information is not intended to replace advice given to you by your health care provider. Make sure you discuss any questions you have with your health care provider. Document Released: 06/14/2003 Document Revised: 03/21/2016 Document Reviewed: 03/21/2016 Elsevier Interactive Patient Education  [employer].

## 2017-06-12 ENCOUNTER — Telehealth: Payer: Self-pay | Admitting: Family Medicine

## 2017-06-12 DIAGNOSIS — R197 Diarrhea, unspecified: Secondary | ICD-10-CM

## 2017-06-12 MED ORDER — DIPHENOXYLATE-ATROPINE 2.5-0.025 MG PO TABS: 1.0000 | ORAL_TABLET | Freq: Four times a day (QID) | ORAL | 0 refills | Status: DC | PRN

## 2017-06-12 NOTE — Telephone Encounter (Signed)
Copied from [place]. Topic: Quick Communication - Lab Results >> Jun 12, 2017  1:42 PM Darl Householder, RMA wrote: Patient is requesting lab results

## 2017-06-12 NOTE — Telephone Encounter (Signed)
Spoke with patient regarding symptoms, she continues to experience diarrhea and bloating. Diarrhea is less frequent and there is no abdominal pain or bloody stools. She is tolerating solids and liquids. Awaiting GI panel to return. Rx for Lomotil sent to pharmacy.

## 2017-06-12 NOTE — Telephone Encounter (Signed)
Spoken to patient. Confirm that she did look at her lab results on MyChart. She was wonder what should be doing because she does not feel any better. She is concern since the weekend is coming up.

## 2017-06-15 ENCOUNTER — Telehealth: Payer: Self-pay | Admitting: Family Medicine

## 2017-06-15 ENCOUNTER — Encounter: Payer: Self-pay | Admitting: Primary Care

## 2017-06-15 LAB — GASTROINTESTINAL PATHOGEN PANEL PCR
C. difficile Tox A/B, PCR: DETECTED — AB
Campylobacter, PCR: NOT DETECTED
Cryptosporidium, PCR: NOT DETECTED
E coli (ETEC) LT/ST PCR: NOT DETECTED
E coli (STEC) stx1/stx2, PCR: NOT DETECTED
E coli 0157, PCR: NOT DETECTED
Giardia lamblia, PCR: NOT DETECTED
Norovirus, PCR: NOT DETECTED
Rotavirus A, PCR: NOT DETECTED
Salmonella, PCR: NOT DETECTED
Shigella, PCR: NOT DETECTED

## 2017-06-15 NOTE — Telephone Encounter (Signed)
Patient was notified regarding blood work. Katrina [name] have not given results regarding stool test.

## 2017-06-15 NOTE — Telephone Encounter (Signed)
Copied from [place]. Topic: Quick Communication - Lab Results >> Jun 15, 2017  1:41 PM Hewitt Shorts wrote: Pt is looking for lab results from Thursday   Best number is [phone]

## 2017-06-16 ENCOUNTER — Other Ambulatory Visit: Payer: Self-pay | Admitting: Primary Care

## 2017-06-16 DIAGNOSIS — A498 Other bacterial infections of unspecified site: Secondary | ICD-10-CM

## 2017-06-16 MED ORDER — METRONIDAZOLE 500 MG PO TABS: 500.0000 mg | ORAL_TABLET | Freq: Three times a day (TID) | ORAL | 0 refills | Status: DC

## 2017-06-17 ENCOUNTER — Encounter: Payer: Self-pay | Admitting: Family Medicine

## 2017-09-08 ENCOUNTER — Other Ambulatory Visit: Payer: Self-pay | Admitting: Family Medicine

## 2017-09-08 DIAGNOSIS — Z1231 Encounter for screening mammogram for malignant neoplasm of breast: Secondary | ICD-10-CM

## 2017-09-30 ENCOUNTER — Ambulatory Visit
Admission: RE | Admit: 2017-09-30 | Discharge: 2017-09-30 | Disposition: A | Discharge status: Home / Self Care | Payer: BC Managed Care – PPO | Source: Ambulatory Visit | Attending: Family Medicine | Admitting: Family Medicine

## 2017-09-30 DIAGNOSIS — Z1231 Encounter for screening mammogram for malignant neoplasm of breast: Secondary | ICD-10-CM | POA: Diagnosis not present

## 2017-10-14 ENCOUNTER — Other Ambulatory Visit: Payer: Self-pay | Admitting: Family Medicine

## 2017-10-14 ENCOUNTER — Telehealth: Payer: Self-pay | Admitting: Family Medicine

## 2017-10-14 DIAGNOSIS — Z1211 Encounter for screening for malignant neoplasm of colon: Secondary | ICD-10-CM

## 2017-10-14 NOTE — Telephone Encounter (Signed)
Ref done  Will route to [facility] 

## 2017-10-14 NOTE — Telephone Encounter (Signed)
I spoke with pt and scheduled her welcome to medicare physical for 11/23/17 and she requested a colonoscopy. She said she can wait until next month if she has to but would prefer to get it scheduled and over with.

## 2017-10-15 NOTE — Telephone Encounter (Signed)
Called the patient and had to leave a message to call me back.

## 2017-11-15 ENCOUNTER — Telehealth: Payer: Self-pay | Admitting: Family Medicine

## 2017-11-15 DIAGNOSIS — E039 Hypothyroidism, unspecified: Secondary | ICD-10-CM

## 2017-11-15 DIAGNOSIS — E7800 Pure hypercholesterolemia, unspecified: Secondary | ICD-10-CM

## 2017-11-15 DIAGNOSIS — Z0000 Encounter for general adult medical examination without abnormal findings: Secondary | ICD-10-CM

## 2017-11-15 NOTE — Telephone Encounter (Signed)
-----   Message from Ellamae Sia sent at 11/10/2017  9:35 AM EDT ----- Regarding: Lab orders for Thursday, 8.15.19 Patient is scheduled for CPX labs, please order future labs, Thanks , Karna Christmas

## 2017-11-19 ENCOUNTER — Other Ambulatory Visit (INDEPENDENT_AMBULATORY_CARE_PROVIDER_SITE_OTHER): Payer: Medicare HMO

## 2017-11-19 DIAGNOSIS — Z0000 Encounter for general adult medical examination without abnormal findings: Secondary | ICD-10-CM

## 2017-11-19 DIAGNOSIS — E039 Hypothyroidism, unspecified: Secondary | ICD-10-CM

## 2017-11-19 DIAGNOSIS — E7800 Pure hypercholesterolemia, unspecified: Secondary | ICD-10-CM

## 2017-11-19 LAB — COMPREHENSIVE METABOLIC PANEL
ALT: 14 U/L (ref 0–35)
AST: 18 U/L (ref 0–37)
Albumin: 4 g/dL (ref 3.5–5.2)
Alkaline Phosphatase: 80 U/L (ref 39–117)
BUN: 13 mg/dL (ref 6–23)
CO2: 30 mEq/L (ref 19–32)
Calcium: 9.6 mg/dL (ref 8.4–10.5)
Chloride: 102 mEq/L (ref 96–112)
Creatinine, Ser: 1.02 mg/dL (ref 0.40–1.20)
GFR: 57.76 mL/min — ABNORMAL LOW (ref >60.00)
Glucose, Bld: 100 mg/dL — ABNORMAL HIGH (ref 70–99)
Potassium: 4.4 mEq/L (ref 3.5–5.1)
Sodium: 138 mEq/L (ref 135–145)
Total Bilirubin: 1.1 mg/dL (ref 0.2–1.2)
Total Protein: 7 g/dL (ref 6.0–8.3)

## 2017-11-19 LAB — LIPID PANEL
Cholesterol: 241 mg/dL — ABNORMAL HIGH (ref 0–200)
HDL: 63.6 mg/dL (ref >39.00)
LDL Cholesterol: 161 mg/dL — ABNORMAL HIGH (ref 0–99)
NonHDL: 177.35
Total CHOL/HDL Ratio: 4
Triglycerides: 83 mg/dL (ref 0.0–149.0)
VLDL: 16.6 mg/dL (ref 0.0–40.0)

## 2017-11-19 LAB — CBC WITH DIFFERENTIAL/PLATELET
Basophils Absolute: 0 10*3/uL (ref 0.0–0.1)
Basophils Relative: 0.9 % (ref 0.0–3.0)
Eosinophils Absolute: 0.1 10*3/uL (ref 0.0–0.7)
Eosinophils Relative: 2.3 % (ref 0.0–5.0)
HCT: 41.9 % (ref 36.0–46.0)
Hemoglobin: 14 g/dL (ref 12.0–15.0)
Lymphocytes Relative: 42.8 % (ref 12.0–46.0)
Lymphs Abs: 2.2 10*3/uL (ref 0.7–4.0)
MCHC: 33.4 g/dL (ref 30.0–36.0)
MCV: 94.8 fl (ref 78.0–100.0)
Monocytes Absolute: 0.5 10*3/uL (ref 0.1–1.0)
Monocytes Relative: 10.6 % (ref 3.0–12.0)
Neutro Abs: 2.3 10*3/uL (ref 1.4–7.7)
Neutrophils Relative %: 43.4 % (ref 43.0–77.0)
Platelets: 270 10*3/uL (ref 150.0–400.0)
RBC: 4.42 Mil/uL (ref 3.87–5.11)
RDW: 12.8 % (ref 11.5–15.5)
WBC: 5.2 10*3/uL (ref 4.0–10.5)

## 2017-11-19 LAB — TSH: TSH: 4.95 u[IU]/mL — ABNORMAL HIGH (ref 0.35–4.50)

## 2017-11-23 ENCOUNTER — Encounter: Payer: Self-pay | Admitting: Family Medicine

## 2017-11-23 ENCOUNTER — Ambulatory Visit (INDEPENDENT_AMBULATORY_CARE_PROVIDER_SITE_OTHER): Payer: Medicare HMO | Admitting: Family Medicine

## 2017-11-23 VITALS — BP 118/74 | HR 60 | Temp 98.3°F | Ht 65.75 in | Wt 152.2 lb

## 2017-11-23 DIAGNOSIS — E7800 Pure hypercholesterolemia, unspecified: Secondary | ICD-10-CM | POA: Diagnosis not present

## 2017-11-23 DIAGNOSIS — M8580 Other specified disorders of bone density and structure, unspecified site: Secondary | ICD-10-CM | POA: Diagnosis not present

## 2017-11-23 DIAGNOSIS — Z23 Encounter for immunization: Secondary | ICD-10-CM | POA: Diagnosis not present

## 2017-11-23 DIAGNOSIS — Z1211 Encounter for screening for malignant neoplasm of colon: Secondary | ICD-10-CM | POA: Diagnosis not present

## 2017-11-23 DIAGNOSIS — Z0000 Encounter for general adult medical examination without abnormal findings: Secondary | ICD-10-CM | POA: Diagnosis not present

## 2017-11-23 DIAGNOSIS — E039 Hypothyroidism, unspecified: Secondary | ICD-10-CM | POA: Diagnosis not present

## 2017-11-23 DIAGNOSIS — E2839 Other primary ovarian failure: Secondary | ICD-10-CM

## 2017-11-23 MED ORDER — LEVOTHYROXINE SODIUM 88 MCG PO TABS: 88.0000 ug | ORAL_TABLET | Freq: Every day | ORAL | 3 refills | Status: DC

## 2017-11-23 MED ORDER — ALBUTEROL SULFATE HFA 108 (90 BASE) MCG/ACT IN AERS: 2.0000 | INHALATION_SPRAY | RESPIRATORY_TRACT | 5 refills | Status: DC | PRN

## 2017-11-23 NOTE — Assessment & Plan Note (Signed)
dexa planned/scheduled Disc need for calcium/ vitamin D/ wt bearing exercise and bone density test every 2 y to monitor Disc safety/ fracture risk in detail

## 2017-11-23 NOTE — Progress Notes (Signed)
Subjective:    Patient ID: Katrina [name], female    DOB: [birth year], 67 y.o.   MRN: [id number]  HPI  I have personally reviewed the Medicare Annual Wellness questionnaire and have noted 1. The patient's medical and social history 2. Their use of alcohol, tobacco or illicit drugs 3. Their current medications and supplements 4. The patient's functional ability including ADL's, fall risks, home safety risks and hearing or visual             impairment. 5. Diet and physical activities 6. Evidence for depression or mood disorders  The patients weight, height, BMI have been recorded in the chart and visual acuity is per eye clinic.  I have made referrals, counseling and provided education to the patient based review of the above and I have provided the pt with a written personalized care plan for preventive services. Reviewed and updated provider list, see scanned forms.  Wt Readings from Last 3 Encounters:  11/23/17 152 lb 4 oz (69.1 kg)  06/11/17 153 lb (69.4 kg)  10/07/16 162 lb 8 oz (73.7 kg)  taking care of herself  Exercising more  Eating healthy as well  24.76 kg/m   Summer is going well - still enjoying retirement and keeping grand kids    See scanned forms.  Routine anticipatory guidance given to patient.  See health maintenance. Colon cancer screening-she has visit today for preop for her colonoscopy at [facility]  Breast cancer screening mammogram  6/19 neg Self breast exam- no lumps or changes  Gyn exam/pap 6/17 neg with neg HPV No gyn symptoms  Flu vaccine- will get in the fall  Tetanus vaccine 5/13  Pneumovax-has not started, will do prevnar  Zoster vaccine- will look into the shingrix vaccine  Bone density  dexa 6/13-osteopenia . Will schedule that  No falls or fractures  Exercise/ca/D-good with that  Advance directive-does not have living will or POA  Cognitive function addressed- see scanned forms- and if abnormal then additional documentation  follows.  No problems   PMH and SH reviewed  Meds, vitals, and allergies reviewed.   ROS: See HPI.  Otherwise negative.     Hearing Screening   125Hz  250Hz  500Hz  1000Hz  2000Hz  3000Hz  4000Hz  6000Hz  8000Hz   Right ear:   40 40 40  40    Left ear:   40 40 40  0    Vision Screening Comments: Had eye exam in Oct 2018 at [facility]   Hypothyroidism  Pt has no clinical changes No change in energy level/ hair or skin/ edema and no tremor Lab Results  Component Value Date   TSH 4.95 (H) 11/19/2017    She feels good  No missed doses  Takes medicine correctly  Hyperlipidemia Lab Results  Component Value Date   CHOL 241 (H) 11/19/2017   CHOL 239 (H) 10/02/2016   CHOL 240 (H) 09/28/2015   Lab Results  Component Value Date   HDL 63.60 11/19/2017   HDL 58.10 10/02/2016   HDL 54.90 09/28/2015   Lab Results  Component Value Date   LDLCALC 161 (H) 11/19/2017   LDLCALC 156 (H) 10/02/2016   LDLCALC 164 (H) 09/28/2015   Lab Results  Component Value Date   TRIG 83.0 11/19/2017   TRIG 126.0 10/02/2016   TRIG 106.0 09/28/2015   Lab Results  Component Value Date   CHOLHDL 4 11/19/2017   CHOLHDL 4 10/02/2016   CHOLHDL 4 09/28/2015   Lab Results  Component Value Date  LDLDIRECT 184.1 03/28/2013   LDLDIRECT 181.2 08/15/2011   LDLDIRECT 177.6 04/02/2010   Was on vacation last week- ate poorly  Wants to re check  She wants to avoid medication   Other labs Results for orders placed or performed in visit on 11/19/17  TSH  Result Value Ref Range   TSH 4.95 (H) 0.35 - 4.50 uIU/mL  Lipid panel  Result Value Ref Range   Cholesterol 241 (H) 0 - 200 mg/dL   Triglycerides 83.0 0.0 - 149.0 mg/dL   HDL 63.60 >39.00 mg/dL   VLDL 16.6 0.0 - 40.0 mg/dL   LDL Cholesterol 161 (H) 0 - 99 mg/dL   Total CHOL/HDL Ratio 4    NonHDL 177.35   CBC with Differential/Platelet  Result Value Ref Range   WBC 5.2 4.0 - 10.5 K/uL   RBC 4.42 3.87 - 5.11 Mil/uL   Hemoglobin 14.0 12.0 -  15.0 g/dL   HCT 41.9 36.0 - 46.0 %   MCV 94.8 78.0 - 100.0 fl   MCHC 33.4 30.0 - 36.0 g/dL   RDW 12.8 11.5 - 15.5 %   Platelets 270.0 150.0 - 400.0 K/uL   Neutrophils Relative % 43.4 43.0 - 77.0 %   Lymphocytes Relative 42.8 12.0 - 46.0 %   Monocytes Relative 10.6 3.0 - 12.0 %   Eosinophils Relative 2.3 0.0 - 5.0 %   Basophils Relative 0.9 0.0 - 3.0 %   Neutro Abs 2.3 1.4 - 7.7 K/uL   Lymphs Abs 2.2 0.7 - 4.0 K/uL   Monocytes Absolute 0.5 0.1 - 1.0 K/uL   Eosinophils Absolute 0.1 0.0 - 0.7 K/uL   Basophils Absolute 0.0 0.0 - 0.1 K/uL  Comprehensive metabolic panel  Result Value Ref Range   Sodium 138 135 - 145 mEq/L   Potassium 4.4 3.5 - 5.1 mEq/L   Chloride 102 96 - 112 mEq/L   CO2 30 19 - 32 mEq/L   Glucose, Bld 100 (H) 70 - 99 mg/dL   BUN 13 6 - 23 mg/dL   Creatinine, Ser 1.02 0.40 - 1.20 mg/dL   Total Bilirubin 1.1 0.2 - 1.2 mg/dL   Alkaline Phosphatase 80 39 - 117 U/L   AST 18 0 - 37 U/L   ALT 14 0 - 35 U/L   Total Protein 7.0 6.0 - 8.3 g/dL   Albumin 4.0 3.5 - 5.2 g/dL   Calcium 9.6 8.4 - 10.5 mg/dL   GFR 57.76 (L) >60.00 mL/min    Needs refill of albuterol for exercise induced asthma    Patient Active Problem List   Diagnosis Date Noted  . Welcome to Medicare preventive visit 11/23/2017  . Encounter for routine gynecological examination 09/28/2015  . Estrogen deficiency 09/28/2015  . Colon cancer screening 03/28/2013  . Routine general medical examination at a health care facility 08/15/2011  . Gynecological examination 08/15/2011  . Post-menopausal 08/15/2011  . Other screening mammogram 01/09/2011  . Osteopenia 01/29/2007  . Hypothyroidism 01/12/2007  . Hyperlipidemia 01/12/2007  . HEEL SPUR 01/12/2007   Past Medical History:  Diagnosis Date  . Heel spur   . HLD (hyperlipidemia)   . Hypothyroid   . Osteopenia    stress fracture in foot  . Plantar fasciitis   . Stress fracture of foot    History reviewed. No pertinent surgical history. Social  History   Tobacco Use  . Smoking status: Never Smoker  . Smokeless tobacco: Never Used  Substance Use Topics  . Alcohol use: Yes  Alcohol/week: 0.0 standard drinks    Comment: Occasional  . Drug use: No   Family History  Problem Relation Age of Onset  . Myelodysplastic syndrome Father   . Hypertension Mother   . Thyroid disease Mother   . Cancer Unknown        GM--gallbladder  . Lung cancer Unknown        GF  . Colon cancer Unknown        GF  . Osteoporosis Unknown        GM  . Breast cancer Neg Hx    Allergies  Allergen Reactions  . Acetaminophen Other (See Comments)    "hyper"  . Clindamycin/Lincomycin     Caused C-diff  . Codeine     REACTION: nausea and vomiting   Current Outpatient Medications on File Prior to Visit  Medication Sig Dispense Refill  . Ascorbic Acid (VITAMIN C PO) Take 1 tablet by mouth daily.    . Calcium-Magnesium-Zinc (CALCIUM-MAGNESUIUM-ZINC PO) Take 1 capsule by mouth daily.    . Cholecalciferol (VITAMIN D3) 2000 units TABS Take 2 tablets by mouth daily.     . metroNIDAZOLE (FLAGYL) 500 MG tablet Take 1 tablet (500 mg total) by mouth 3 (three) times daily. 30 tablet 0  . Multiple Vitamin (MULTIVITAMIN) tablet Take 1 tablet by mouth daily.    . Omega-3 Fatty Acids (FISH OIL PO) Take 1 tablet by mouth daily.     No current facility-administered medications on file prior to visit.     Review of Systems  Constitutional: Negative for activity change, appetite change, fatigue, fever and unexpected weight change.  HENT: Negative for congestion, ear pain, rhinorrhea, sinus pressure and sore throat.   Eyes: Negative for pain, redness and visual disturbance.  Respiratory: Negative for cough, shortness of breath and wheezing.        Exercise induced asthma occ-not often  Cardiovascular: Negative for chest pain and palpitations.  Gastrointestinal: Negative for abdominal pain, blood in stool, constipation and diarrhea.  Endocrine: Negative for  polydipsia and polyuria.  Genitourinary: Negative for dysuria, frequency and urgency.  Musculoskeletal: Negative for arthralgias, back pain and myalgias.  Skin: Negative for pallor and rash.  Allergic/Immunologic: Negative for environmental allergies.  Neurological: Negative for dizziness, syncope and headaches.  Hematological: Negative for adenopathy. Does not bruise/bleed easily.  Psychiatric/Behavioral: Negative for decreased concentration and dysphoric mood. The patient is not nervous/anxious.        Objective:   Physical Exam  Constitutional: She appears well-developed and well-nourished. No distress.  Well appearing   HENT:  Head: Normocephalic and atraumatic.  Right Ear: External ear normal.  Left Ear: External ear normal.  Mouth/Throat: Oropharynx is clear and moist.  Eyes: Pupils are equal, round, and reactive to light. Conjunctivae and EOM are normal. No scleral icterus.  Neck: Normal range of motion. Neck supple. No JVD present. Carotid bruit is not present. No tracheal deviation present. No thyromegaly present.  Cardiovascular: Normal rate, regular rhythm, normal heart sounds and intact distal pulses. Exam reveals no gallop.  Pulmonary/Chest: Effort normal and breath sounds normal. No stridor. No respiratory distress. She has no wheezes. She exhibits no tenderness. No breast tenderness, discharge or bleeding.  Abdominal: Soft. Bowel sounds are normal. She exhibits no distension, no abdominal bruit and no mass. There is no tenderness.  Genitourinary: No breast tenderness, discharge or bleeding.  Genitourinary Comments: Breast exam: No mass, nodules, thickening, tenderness, bulging, retraction, inflamation, nipple discharge or skin changes noted.  No axillary or  clavicular LA.      Musculoskeletal: Normal range of motion. She exhibits no edema or tenderness.  Lymphadenopathy:    She has no cervical adenopathy.  Neurological: She is alert. She has normal reflexes. No cranial  nerve deficit. She exhibits normal muscle tone. Coordination normal.  Skin: Skin is warm and dry. No rash noted. No erythema. No pallor.  Solar lentigines diffusely Solar aging   Psychiatric: She has a normal mood and affect. Her mood appears not anxious. She does not exhibit a depressed mood.  pleasant          Assessment & Plan:   Problem List Items Addressed This Visit      Endocrine   Hypothyroidism    Hypothyroidism  Pt has no clinical changes No change in energy level/ hair or skin/ edema and no tremor Lab Results  Component Value Date   TSH 4.95 (H) 11/19/2017    This is very slt off with no symptoms Re check in 3 mo       Relevant Medications   levothyroxine (SYNTHROID, LEVOTHROID) 88 MCG tablet   Other Relevant Orders   TSH     Musculoskeletal and Integument   Osteopenia    dexa planned/scheduled Disc need for calcium/ vitamin D/ wt bearing exercise and bone density test every 2 y to monitor Disc safety/ fracture risk in detail            Other   Colon cancer screening    Pt has colonoscopy scheduled at [facility] clinic      Estrogen deficiency   Relevant Orders   DG Bone Density   Hyperlipidemia    Disc goals for lipids and reasons to control them Rev last labs with pt Rev low sat fat diet in detail LDL is up- just back from vacation eating bacon and shellfish Will re check 3 mo  She is not open to cholesterol med at this time       Relevant Orders   Lipid panel   Welcome to Medicare preventive visit - Primary    Reviewed health habits including diet and exercise and skin cancer prevention Reviewed appropriate screening tests for age  Also reviewed health mt list, fam hx and immunization status , as well as social and family history   Doing well in retirement with good self care Colonoscopy is upcoming soon  prevnar vaccine today  Disc shingrix vaccine  Scheduled bone density test  Packet given to work on [employer] directive  No cognitive  issues No hearing/vision concerns  Labs reviewed (re check lipid and TSH in 3 mo)        Other Visit Diagnoses    Need for vaccination with 13-polyvalent pneumococcal conjugate vaccine       Relevant Orders   Pneumococcal conjugate vaccine 13-valent (Completed)

## 2017-11-23 NOTE — Assessment & Plan Note (Signed)
Disc goals for lipids and reasons to control them Rev last labs with pt Rev low sat fat diet in detail LDL is up- just back from vacation eating bacon and shellfish Will re check 3 mo  She is not open to cholesterol med at this time

## 2017-11-23 NOTE — Assessment & Plan Note (Signed)
Pt has colonoscopy scheduled at [facility]

## 2017-11-23 NOTE — Assessment & Plan Note (Signed)
Reviewed health habits including diet and exercise and skin cancer prevention Reviewed appropriate screening tests for age  Also reviewed health mt list, fam hx and immunization status , as well as social and family history   Doing well in retirement with good self care Colonoscopy is upcoming soon  prevnar vaccine today  Disc shingrix vaccine  Scheduled bone density test  Packet given to work on adv directive  No cognitive issues No hearing/vision concerns  Labs reviewed (re check lipid and TSH in 3 mo)

## 2017-11-23 NOTE — Patient Instructions (Addendum)
Don't forget to get a flu shot in the fall   If you are interested in the new shingles vaccine (Shingrix) - call your local pharmacy to check on coverage and availability  If affordable- get on a waiting list   Please look at the info regarding - advance directive   Avoid red meat/ fried foods/ egg yolks/ fatty breakfast meats/ butter, cheese and high fat dairy/ and shellfish    Keep up the good diet and exercise

## 2017-11-23 NOTE — Assessment & Plan Note (Signed)
Hypothyroidism  Pt has no clinical changes No change in energy level/ hair or skin/ edema and no tremor Lab Results  Component Value Date   TSH 4.95 (H) 11/19/2017    This is very slt off with no symptoms Re check in 3 mo

## 2017-12-14 ENCOUNTER — Encounter: Payer: BC Managed Care – PPO | Admitting: Primary Care

## 2018-02-05 DIAGNOSIS — R69 Illness, unspecified: Secondary | ICD-10-CM | POA: Diagnosis not present

## 2018-02-09 DIAGNOSIS — K640 First degree hemorrhoids: Secondary | ICD-10-CM | POA: Diagnosis not present

## 2018-02-09 DIAGNOSIS — K635 Polyp of colon: Secondary | ICD-10-CM | POA: Diagnosis not present

## 2018-02-09 DIAGNOSIS — D125 Benign neoplasm of sigmoid colon: Secondary | ICD-10-CM | POA: Diagnosis not present

## 2018-02-09 DIAGNOSIS — Z1211 Encounter for screening for malignant neoplasm of colon: Secondary | ICD-10-CM | POA: Diagnosis not present

## 2018-02-09 LAB — HM COLONOSCOPY

## 2018-02-19 ENCOUNTER — Telehealth: Payer: Self-pay | Admitting: Family Medicine

## 2018-02-19 NOTE — Telephone Encounter (Signed)
Bone density ordered 8/19 Left message 8/23 Spoke with pt  On 9/27 and 10/23 she stated she will check with ins and call me back.  Do you want me to continue reaching out to pt to schedule?

## 2018-02-19 NOTE — Telephone Encounter (Signed)
Perhaps one more time- but if unreachable you can stop  Thanks

## 2018-02-25 NOTE — Telephone Encounter (Signed)
Left message asking pt to call office  °

## 2018-03-10 NOTE — Telephone Encounter (Signed)
Left message asking pt to call office  °

## 2018-04-07 HISTORY — PX: COLONOSCOPY: SHX174

## 2018-04-19 ENCOUNTER — Encounter: Payer: Self-pay | Admitting: Family Medicine

## 2018-04-22 DIAGNOSIS — H25093 Other age-related incipient cataract, bilateral: Secondary | ICD-10-CM | POA: Diagnosis not present

## 2018-04-22 DIAGNOSIS — Z0100 Encounter for examination of eyes and vision without abnormal findings: Secondary | ICD-10-CM | POA: Diagnosis not present

## 2018-04-27 DIAGNOSIS — Z0100 Encounter for examination of eyes and vision without abnormal findings: Secondary | ICD-10-CM | POA: Diagnosis not present

## 2018-06-01 ENCOUNTER — Ambulatory Visit
Admission: RE | Admit: 2018-06-01 | Discharge: 2018-06-01 | Disposition: A | Discharge status: Home / Self Care | Payer: Medicare HMO | Source: Ambulatory Visit | Attending: Family Medicine | Admitting: Family Medicine

## 2018-06-01 DIAGNOSIS — E2839 Other primary ovarian failure: Secondary | ICD-10-CM | POA: Insufficient documentation

## 2018-06-01 DIAGNOSIS — Z780 Asymptomatic menopausal state: Secondary | ICD-10-CM | POA: Diagnosis not present

## 2018-06-01 DIAGNOSIS — M8589 Other specified disorders of bone density and structure, multiple sites: Secondary | ICD-10-CM | POA: Diagnosis not present

## 2018-06-24 ENCOUNTER — Telehealth: Payer: Self-pay

## 2018-06-24 ENCOUNTER — Ambulatory Visit (INDEPENDENT_AMBULATORY_CARE_PROVIDER_SITE_OTHER): Payer: Medicare HMO | Admitting: Family Medicine

## 2018-06-24 ENCOUNTER — Ambulatory Visit (INDEPENDENT_AMBULATORY_CARE_PROVIDER_SITE_OTHER)
Admission: RE | Admit: 2018-06-24 | Discharge: 2018-06-24 | Disposition: A | Discharge status: Home / Self Care | Payer: Medicare HMO | Source: Ambulatory Visit | Attending: Family Medicine | Admitting: Family Medicine

## 2018-06-24 ENCOUNTER — Other Ambulatory Visit: Payer: Self-pay

## 2018-06-24 ENCOUNTER — Encounter: Payer: Self-pay | Admitting: Family Medicine

## 2018-06-24 VITALS — BP 122/76 | HR 78 | Temp 98.4°F | Ht 65.75 in | Wt 155.4 lb

## 2018-06-24 DIAGNOSIS — R05 Cough: Secondary | ICD-10-CM | POA: Diagnosis not present

## 2018-06-24 DIAGNOSIS — E7800 Pure hypercholesterolemia, unspecified: Secondary | ICD-10-CM

## 2018-06-24 DIAGNOSIS — R059 Cough, unspecified: Secondary | ICD-10-CM

## 2018-06-24 DIAGNOSIS — E039 Hypothyroidism, unspecified: Secondary | ICD-10-CM

## 2018-06-24 MED ORDER — PREDNISONE 10 MG PO TABS: ORAL_TABLET | ORAL | 0 refills | Status: DC

## 2018-06-24 NOTE — Telephone Encounter (Signed)
Pt has had non prod cough for 4 wks, no fever, some head congestion, No SOB, for 2 days has heaviness in mid chest and hard to breath. Wheezing on and off. The heaviness and wheezing is usually in early AM or evening. Pt said the inhaler helps the wheezing and heaviness. Pt has not traveled or been exposed to positive covid or flu in last 14 days. Pt did travel 4 wks ago to [place]. Pt said she is in no distress and pt does  Not think symptoms pt is having are heart related but seem to be respiratory. Pt scheduled appt with Dr Damita Dunnings 06/24/18 at 3 PM. ED precautions given and pt voiced understanding.

## 2018-06-24 NOTE — Patient Instructions (Addendum)
Go to the lab on the way out.  We'll contact you with your xray report.  Use albuterol as needed.  If needed frequently, then start prednisone.   Update [place] as needed.  Take care.  Glad to see you.

## 2018-06-24 NOTE — Telephone Encounter (Signed)
See OV note.  Thanks.  

## 2018-06-24 NOTE — Progress Notes (Signed)
She can tolerate tylenol.  Allergy list updated re: ibuprofen.  She can tolerate prednisone if needed.   Due for f/u labs, see future orders from PCP.    Cough for about 4 weeks.  She had travelled to [facility] and then had a cough after return.  She has some sputum prev, but that resolved.  No fevers.  No aches.  She has some chest heaviness in the last week.  Had used SABA recently with some relief but not today.  SABA needed more in the early AM and late PM.  Some wheeze, usually early AM or late PM, with relief from SABA.  She reports h/o exercise induced asthma.  Was never on any other inhalers.    Meds, vitals, and allergies reviewed.   ROS: Per HPI unless specifically indicated in ROS section   GEN: nad, alert and oriented HEENT: mucous membranes moist, TM wnl, OP wnl NECK: supple w/o LA CV: rrr.  PULM: ctab, no inc wob ABD: soft, +bs EXT: no edema Skin well perfused.

## 2018-06-25 LAB — TSH: TSH: 3.15 u[IU]/mL (ref 0.35–4.50)

## 2018-06-25 LAB — LIPID PANEL
Cholesterol: 300 mg/dL — ABNORMAL HIGH (ref 0–200)
HDL: 76.4 mg/dL (ref >39.00)
LDL Cholesterol: 195 mg/dL — ABNORMAL HIGH (ref 0–99)
NonHDL: 223.55
Total CHOL/HDL Ratio: 4
Triglycerides: 145 mg/dL (ref 0.0–149.0)
VLDL: 29 mg/dL (ref 0.0–40.0)

## 2018-06-27 ENCOUNTER — Encounter: Payer: Self-pay | Admitting: Family Medicine

## 2018-06-27 ENCOUNTER — Other Ambulatory Visit: Payer: Self-pay | Admitting: Family Medicine

## 2018-06-27 DIAGNOSIS — R918 Other nonspecific abnormal finding of lung field: Secondary | ICD-10-CM

## 2018-06-27 NOTE — Assessment & Plan Note (Signed)
Discussed with patient about options.  Lungs are clear and she is without wheeze currently.  Nontoxic.  Speaking in complete sentences.  Okay for outpatient follow-up.  She does report a history of exercise-induced asthma.  She could have a component of allergies/asthma trigger with recent season change.  She also could have a postinfectious cough related to a previous illness.  Reasonable to check chest x-ray today.  See notes on imaging.  Use albuterol as needed.  If needed frequently, then start prednisone.  Routine cautions given.  She agrees.  Routed to PCP as FYI.

## 2018-06-29 ENCOUNTER — Ambulatory Visit
Admission: RE | Admit: 2018-06-29 | Discharge: 2018-06-29 | Disposition: A | Discharge status: Home / Self Care | Payer: Medicare HMO | Source: Ambulatory Visit | Attending: Family Medicine | Admitting: Family Medicine

## 2018-06-29 ENCOUNTER — Other Ambulatory Visit: Payer: Self-pay

## 2018-06-29 ENCOUNTER — Other Ambulatory Visit: Payer: Self-pay | Admitting: Family Medicine

## 2018-06-29 DIAGNOSIS — R05 Cough: Secondary | ICD-10-CM | POA: Diagnosis not present

## 2018-06-29 DIAGNOSIS — R918 Other nonspecific abnormal finding of lung field: Secondary | ICD-10-CM

## 2018-07-13 ENCOUNTER — Other Ambulatory Visit: Payer: Medicare HMO

## 2018-08-24 DIAGNOSIS — L720 Epidermal cyst: Secondary | ICD-10-CM | POA: Diagnosis not present

## 2018-08-24 DIAGNOSIS — L821 Other seborrheic keratosis: Secondary | ICD-10-CM | POA: Diagnosis not present

## 2018-08-24 DIAGNOSIS — D485 Neoplasm of uncertain behavior of skin: Secondary | ICD-10-CM | POA: Diagnosis not present

## 2018-08-24 DIAGNOSIS — L570 Actinic keratosis: Secondary | ICD-10-CM | POA: Diagnosis not present

## 2018-10-12 ENCOUNTER — Encounter: Payer: Self-pay | Admitting: Family Medicine

## 2018-11-02 DIAGNOSIS — L820 Inflamed seborrheic keratosis: Secondary | ICD-10-CM | POA: Diagnosis not present

## 2018-11-02 DIAGNOSIS — L814 Other melanin hyperpigmentation: Secondary | ICD-10-CM | POA: Diagnosis not present

## 2018-11-02 DIAGNOSIS — L570 Actinic keratosis: Secondary | ICD-10-CM | POA: Diagnosis not present

## 2018-11-02 DIAGNOSIS — I8393 Asymptomatic varicose veins of bilateral lower extremities: Secondary | ICD-10-CM | POA: Diagnosis not present

## 2018-11-02 DIAGNOSIS — L578 Other skin changes due to chronic exposure to nonionizing radiation: Secondary | ICD-10-CM | POA: Diagnosis not present

## 2018-11-02 DIAGNOSIS — S40862A Insect bite (nonvenomous) of left upper arm, initial encounter: Secondary | ICD-10-CM | POA: Diagnosis not present

## 2018-11-02 DIAGNOSIS — I781 Nevus, non-neoplastic: Secondary | ICD-10-CM | POA: Diagnosis not present

## 2018-11-02 DIAGNOSIS — D1800 Hemangioma unspecified site: Secondary | ICD-10-CM | POA: Diagnosis not present

## 2018-11-02 DIAGNOSIS — D225 Melanocytic nevi of trunk: Secondary | ICD-10-CM | POA: Diagnosis not present

## 2018-11-02 DIAGNOSIS — L821 Other seborrheic keratosis: Secondary | ICD-10-CM | POA: Diagnosis not present

## 2018-11-18 ENCOUNTER — Telehealth: Payer: Self-pay

## 2018-11-18 NOTE — Telephone Encounter (Signed)
Left detailed VM w COVID screen and back door lab info   

## 2018-11-19 ENCOUNTER — Telehealth: Payer: Self-pay | Admitting: Family Medicine

## 2018-11-19 DIAGNOSIS — E7800 Pure hypercholesterolemia, unspecified: Secondary | ICD-10-CM

## 2018-11-19 DIAGNOSIS — E039 Hypothyroidism, unspecified: Secondary | ICD-10-CM

## 2018-11-19 DIAGNOSIS — M8580 Other specified disorders of bone density and structure, unspecified site: Secondary | ICD-10-CM

## 2018-11-19 NOTE — Telephone Encounter (Signed)
-----   Message from Ellamae Sia sent at 11/15/2018 12:25 PM EDT ----- Regarding: Lab orders for Monday 8.17.20 Patient is scheduled for CPX labs, please order future labs, Thanks , Karna Christmas

## 2018-11-21 ENCOUNTER — Telehealth: Payer: Self-pay | Admitting: Family Medicine

## 2018-11-21 DIAGNOSIS — E039 Hypothyroidism, unspecified: Secondary | ICD-10-CM

## 2018-11-21 DIAGNOSIS — E7800 Pure hypercholesterolemia, unspecified: Secondary | ICD-10-CM

## 2018-11-21 DIAGNOSIS — Z0000 Encounter for general adult medical examination without abnormal findings: Secondary | ICD-10-CM

## 2018-11-21 NOTE — Telephone Encounter (Signed)
-----   Message from Ellamae Sia sent at 11/15/2018 12:23 PM EDT ----- Regarding: Lab orders for Monday 8.17.20 Lab orders

## 2018-11-21 NOTE — Telephone Encounter (Signed)
-----   Message from Jinny Sanders, MD sent at 11/19/2018  5:43 PM EDT ----- Regarding: RE: Lab orders for Monday 8.17.20 PCP Waqas Bruhl ----- Message ----- From: Ellamae Sia Sent: 11/15/2018  12:25 PM EDT To: Jinny Sanders, MD Subject: Lab orders for Monday 8.17.20                  Patient is scheduled for CPX labs, please order future labs, Thanks , Katrina [name]

## 2018-11-22 ENCOUNTER — Other Ambulatory Visit (INDEPENDENT_AMBULATORY_CARE_PROVIDER_SITE_OTHER): Payer: Medicare HMO

## 2018-11-22 ENCOUNTER — Other Ambulatory Visit: Payer: Self-pay

## 2018-11-22 DIAGNOSIS — E039 Hypothyroidism, unspecified: Secondary | ICD-10-CM

## 2018-11-22 DIAGNOSIS — E7800 Pure hypercholesterolemia, unspecified: Secondary | ICD-10-CM

## 2018-11-22 DIAGNOSIS — Z0000 Encounter for general adult medical examination without abnormal findings: Secondary | ICD-10-CM | POA: Diagnosis not present

## 2018-11-22 LAB — CBC WITH DIFFERENTIAL/PLATELET
Basophils Absolute: 0 10*3/uL (ref 0.0–0.1)
Basophils Relative: 1 % (ref 0.0–3.0)
Eosinophils Absolute: 0.1 10*3/uL (ref 0.0–0.7)
Eosinophils Relative: 3.1 % (ref 0.0–5.0)
HCT: 41.8 % (ref 36.0–46.0)
Hemoglobin: 14 g/dL (ref 12.0–15.0)
Lymphocytes Relative: 40.7 % (ref 12.0–46.0)
Lymphs Abs: 1.8 10*3/uL (ref 0.7–4.0)
MCHC: 33.4 g/dL (ref 30.0–36.0)
MCV: 94.9 fl (ref 78.0–100.0)
Monocytes Absolute: 0.5 10*3/uL (ref 0.1–1.0)
Monocytes Relative: 10.5 % (ref 3.0–12.0)
Neutro Abs: 2 10*3/uL (ref 1.4–7.7)
Neutrophils Relative %: 44.7 % (ref 43.0–77.0)
Platelets: 268 10*3/uL (ref 150.0–400.0)
RBC: 4.4 Mil/uL (ref 3.87–5.11)
RDW: 13.2 % (ref 11.5–15.5)
WBC: 4.4 10*3/uL (ref 4.0–10.5)

## 2018-11-22 LAB — COMPREHENSIVE METABOLIC PANEL
ALT: 14 U/L (ref 0–35)
AST: 16 U/L (ref 0–37)
Albumin: 4.1 g/dL (ref 3.5–5.2)
Alkaline Phosphatase: 73 U/L (ref 39–117)
BUN: 12 mg/dL (ref 6–23)
CO2: 26 mEq/L (ref 19–32)
Calcium: 9.2 mg/dL (ref 8.4–10.5)
Chloride: 103 mEq/L (ref 96–112)
Creatinine, Ser: 0.89 mg/dL (ref 0.40–1.20)
GFR: 63.41 mL/min (ref >60.00)
Glucose, Bld: 93 mg/dL (ref 70–99)
Potassium: 4.4 mEq/L (ref 3.5–5.1)
Sodium: 137 mEq/L (ref 135–145)
Total Bilirubin: 1.1 mg/dL (ref 0.2–1.2)
Total Protein: 6.6 g/dL (ref 6.0–8.3)

## 2018-11-22 LAB — TSH: TSH: 3.88 u[IU]/mL (ref 0.35–4.50)

## 2018-11-22 LAB — LIPID PANEL
Cholesterol: 249 mg/dL — ABNORMAL HIGH (ref 0–200)
HDL: 62.1 mg/dL (ref >39.00)
LDL Cholesterol: 167 mg/dL — ABNORMAL HIGH (ref 0–99)
NonHDL: 186.44
Total CHOL/HDL Ratio: 4
Triglycerides: 96 mg/dL (ref 0.0–149.0)
VLDL: 19.2 mg/dL (ref 0.0–40.0)

## 2018-11-25 ENCOUNTER — Ambulatory Visit (INDEPENDENT_AMBULATORY_CARE_PROVIDER_SITE_OTHER): Payer: Medicare HMO | Admitting: Family Medicine

## 2018-11-25 ENCOUNTER — Other Ambulatory Visit: Payer: Self-pay

## 2018-11-25 ENCOUNTER — Encounter: Payer: Self-pay | Admitting: Family Medicine

## 2018-11-25 VITALS — BP 110/68 | HR 54 | Temp 98.4°F | Ht 66.0 in | Wt 159.5 lb

## 2018-11-25 DIAGNOSIS — E039 Hypothyroidism, unspecified: Secondary | ICD-10-CM

## 2018-11-25 DIAGNOSIS — E7800 Pure hypercholesterolemia, unspecified: Secondary | ICD-10-CM

## 2018-11-25 DIAGNOSIS — Z1211 Encounter for screening for malignant neoplasm of colon: Secondary | ICD-10-CM | POA: Diagnosis not present

## 2018-11-25 DIAGNOSIS — Z0000 Encounter for general adult medical examination without abnormal findings: Secondary | ICD-10-CM | POA: Diagnosis not present

## 2018-11-25 DIAGNOSIS — M8580 Other specified disorders of bone density and structure, unspecified site: Secondary | ICD-10-CM

## 2018-11-25 DIAGNOSIS — Z1231 Encounter for screening mammogram for malignant neoplasm of breast: Secondary | ICD-10-CM

## 2018-11-25 DIAGNOSIS — Z23 Encounter for immunization: Secondary | ICD-10-CM

## 2018-11-25 MED ORDER — ROSUVASTATIN CALCIUM 10 MG PO TABS: 10.0000 mg | ORAL_TABLET | Freq: Every day | ORAL | 3 refills | Status: DC

## 2018-11-25 MED ORDER — LEVOTHYROXINE SODIUM 88 MCG PO TABS: 88.0000 ug | ORAL_TABLET | Freq: Every day | ORAL | 3 refills | Status: DC

## 2018-11-25 NOTE — Assessment & Plan Note (Signed)
Reviewed health habits including diet and exercise and skin cancer prevention Reviewed appropriate screening tests for age  Also reviewed health mt list, fam hx and immunization status , as well as social and family history   See HPI Labs reviewed  Flu and pna 23 vaccines today  Pt is looking into coverage of shingrix  Had yearly derm visit  Given materials to work on adv directive No cognitive c/o  Hearing and vision exams- reassuring  Sent for her recent colonoscopy report

## 2018-11-25 NOTE — Assessment & Plan Note (Signed)
She had a colonoscopy out of office in the fall  We will call for that result Per pt-normal

## 2018-11-25 NOTE — Assessment & Plan Note (Signed)
Disc goals for lipids and reasons to control them Rev last labs with pt Rev low sat fat diet in detail LDL is improved but still high  Good diet  Pt is open to trying statin  crestor 10 mg daily-disc poss side eff/will update Re check lipids in about 2 mo

## 2018-11-25 NOTE — Assessment & Plan Note (Signed)
Hypothyroidism  Pt has no clinical changes No change in energy level/ hair or skin/ edema and no tremor Lab Results  Component Value Date   TSH 3.88 11/22/2018

## 2018-11-25 NOTE — Progress Notes (Signed)
Subjective:    Patient ID: Katrina [name], female    DOB: Feb 10, 1951, 68 y.o.   MRN: [id number]  HPI  Here for amw as well as health mt exam and rev of chronic health problems  I have personally reviewed the Medicare Annual Wellness questionnaire and have noted 1. The patient's medical and social history 2. Their use of alcohol, tobacco or illicit drugs 3. Their current medications and supplements 4. The patient's functional ability including ADL's, fall risks, home safety risks and hearing or visual             impairment. 5. Diet and physical activities 6. Evidence for depression or mood disorders  The patients weight, height, BMI have been recorded in the chart and visual acuity is per eye clinic.  I have made referrals, counseling and provided education to the patient based review of the above and I have provided the pt with a written personalized care plan for preventive services. Reviewed and updated provider list, see scanned forms.  See scanned forms.  Routine anticipatory guidance given to patient.  See health maintenance. Colon cancer screening -colonoscopy last fall - 10 y recall  Breast cancer screening mammogram 6/19- needs to schedule that -has the reminder   Self breast exam-no lumps  Flu vaccine-will get today  Tetanus vaccine Tdap 5/13 Pneumovax 8/19 prevnar , now needs pna 23 vaccine Zoster vaccine-interested in shingrix  dexa 2/20-worsened osteopenia  Falls-none  Fractures-none  Supplements taking D and Ca Exercise - core/yoga and walking  Goes to derm yearly for skin screen- several spots removed  Advance directive-does not have yet - will work in that [employer] function addressed- see scanned forms- and if abnormal then additional documentation follows.  No concerns  She watches her memory carefully  Mother has alz -she knows what to watch for   PMH and SH reviewed  Meds, vitals, and allergies reviewed.   ROS: See HPI.  Otherwise  negative.    Weight  Wt Readings from Last 3 Encounters:  11/25/18 159 lb 8 oz (72.3 kg)  06/24/18 155 lb 6 oz (70.5 kg)  11/23/17 152 lb 4 oz (69.1 kg)   25.74 kg/m   Vision/hearing   Hearing Screening   125Hz  250Hz  500Hz  1000Hz  2000Hz  3000Hz  4000Hz  6000Hz  8000Hz   Right ear:   40 40 40  40    Left ear:   40 40 40  0    Vision Screening Comments: Pt had eye exam in Feb 2020 at [facility].  Has not noticed any hearing problems  Wears contact lenses   BP Readings from Last 3 Encounters:  11/25/18 110/68  06/24/18 122/76  11/23/17 118/74   Pulse Readings from Last 3 Encounters:  11/25/18 (!) 54  06/24/18 78  11/23/17 60    Hypothyroidism  Pt has no clinical changes No change in energy level/ hair or skin/ edema and no tremor Lab Results  Component Value Date   TSH 3.88 11/22/2018     Hyperlipidemia Lab Results  Component Value Date   CHOL 249 (H) 11/22/2018   CHOL 300 (H) 06/24/2018   CHOL 241 (H) 11/19/2017   Lab Results  Component Value Date   HDL 62.10 11/22/2018   HDL 76.40 06/24/2018   HDL 63.60 11/19/2017   Lab Results  Component Value Date   LDLCALC 167 (H) 11/22/2018   LDLCALC 195 (H) 06/24/2018   LDLCALC 161 (H) 11/19/2017   Lab Results  Component Value Date  TRIG 96.0 11/22/2018   TRIG 145.0 06/24/2018   TRIG 83.0 11/19/2017   Lab Results  Component Value Date   CHOLHDL 4 11/22/2018   CHOLHDL 4 06/24/2018   CHOLHDL 4 11/19/2017   Lab Results  Component Value Date   LDLDIRECT 184.1 03/28/2013   LDLDIRECT 181.2 08/15/2011   LDLDIRECT 177.6 04/02/2010   Has declined cholesterol medicine in the past   Lab Results  Component Value Date   WBC 4.4 11/22/2018   HGB 14.0 11/22/2018   HCT 41.8 11/22/2018   MCV 94.9 11/22/2018   PLT 268.0 11/22/2018   Lab Results  Component Value Date   CREATININE 0.89 11/22/2018   BUN 12 11/22/2018   NA 137 11/22/2018   K 4.4 11/22/2018   CL 103 11/22/2018   CO2 26 11/22/2018   Lab  Results  Component Value Date   ALT 14 11/22/2018   AST 16 11/22/2018   ALKPHOS 73 11/22/2018   BILITOT 1.1 11/22/2018   glucose 93   Patient Active Problem List   Diagnosis Date Noted  . Medicare annual wellness visit, initial 11/25/2018  . Welcome to Medicare preventive visit 11/23/2017  . Encounter for routine gynecological examination 09/28/2015  . Estrogen deficiency 09/28/2015  . Colon cancer screening 03/28/2013  . Routine general medical examination at a health care facility 08/15/2011  . Gynecological examination 08/15/2011  . Post-menopausal 08/15/2011  . Screening mammogram, encounter for 01/09/2011  . Osteopenia 01/29/2007  . Hypothyroidism 01/12/2007  . Hyperlipidemia 01/12/2007  . HEEL SPUR 01/12/2007   Past Medical History:  Diagnosis Date  . Heel spur   . HLD (hyperlipidemia)   . Hypothyroid   . Osteopenia    stress fracture in foot  . Plantar fasciitis   . Stress fracture of foot    No past surgical history on file. Social History   Tobacco Use  . Smoking status: Never Smoker  . Smokeless tobacco: Never Used  Substance Use Topics  . Alcohol use: Yes    Alcohol/week: 0.0 standard drinks    Comment: Occasional  . Drug use: No   Family History  Problem Relation Age of Onset  . Myelodysplastic syndrome Father   . Hypertension Mother   . Thyroid disease Mother   . Cancer Other        GM--gallbladder  . Lung cancer Other        GF  . Colon cancer Other        GF  . Osteoporosis Other        GM  . Breast cancer Neg Hx    Allergies  Allergen Reactions  . Advil [Ibuprofen]     Not an allergy but "feels awful the next day."  . Clindamycin/Lincomycin     Caused C-diff  . Codeine     REACTION: nausea and vomiting   Current Outpatient Medications on File Prior to Visit  Medication Sig Dispense Refill  . albuterol (PROVENTIL HFA;VENTOLIN HFA) 108 (90 Base) MCG/ACT inhaler Inhale 2 puffs into the lungs every 4 (four) hours as needed for  wheezing. 1 Inhaler 5  . Ascorbic Acid (VITAMIN C PO) Take 1 tablet by mouth daily.    . Calcium-Magnesium-Zinc (CALCIUM-MAGNESUIUM-ZINC PO) Take 1 capsule by mouth daily.    . Cholecalciferol (VITAMIN D3) 2000 units TABS Take 2 tablets by mouth daily.     . Multiple Vitamin (MULTIVITAMIN) tablet Take 1 tablet by mouth daily.    . Omega-3 Fatty Acids (FISH OIL PO) Take 1 tablet  by mouth daily.     No current facility-administered medications on file prior to visit.     Review of Systems  Constitutional: Negative for activity change, appetite change, fatigue, fever and unexpected weight change.  HENT: Negative for congestion, ear pain, rhinorrhea, sinus pressure and sore throat.   Eyes: Negative for pain, redness and visual disturbance.  Respiratory: Negative for cough, shortness of breath and wheezing.   Cardiovascular: Negative for chest pain and palpitations.  Gastrointestinal: Negative for abdominal pain, blood in stool, constipation and diarrhea.  Endocrine: Negative for polydipsia and polyuria.  Genitourinary: Negative for dysuria, frequency and urgency.  Musculoskeletal: Negative for arthralgias, back pain and myalgias.  Skin: Negative for pallor and rash.  Allergic/Immunologic: Negative for environmental allergies.  Neurological: Negative for dizziness, syncope and headaches.  Hematological: Negative for adenopathy. Does not bruise/bleed easily.  Psychiatric/Behavioral: Negative for decreased concentration and dysphoric mood. The patient is not nervous/anxious.        Objective:   Physical Exam Constitutional:      General: She is not in acute distress.    Appearance: Normal appearance. She is well-developed and normal weight. She is not ill-appearing.  HENT:     Head: Normocephalic and atraumatic.     Right Ear: Tympanic membrane, ear canal and external ear normal.     Left Ear: Tympanic membrane, ear canal and external ear normal.     Nose: Nose normal.     Mouth/Throat:      Mouth: Mucous membranes are moist.     Pharynx: Oropharynx is clear. No posterior oropharyngeal erythema.  Eyes:     General: No scleral icterus.    Conjunctiva/sclera: Conjunctivae normal.     Pupils: Pupils are equal, round, and reactive to light.  Neck:     Musculoskeletal: Normal range of motion and neck supple. No neck rigidity or muscular tenderness.     Thyroid: No thyromegaly.     Vascular: No carotid bruit or JVD.  Cardiovascular:     Rate and Rhythm: Normal rate and regular rhythm.     Pulses: Normal pulses.     Heart sounds: Normal heart sounds. No gallop.   Pulmonary:     Effort: Pulmonary effort is normal. No respiratory distress.     Breath sounds: Normal breath sounds. No wheezing or rales.  Chest:     Chest wall: No tenderness.  Abdominal:     General: Bowel sounds are normal. There is no distension or abdominal bruit.     Palpations: Abdomen is soft. There is no mass.     Tenderness: There is no abdominal tenderness.     Hernia: No hernia is present.  Genitourinary:    Comments: Breast exam: No mass, nodules, thickening, tenderness, bulging, retraction, inflamation, nipple discharge or skin changes noted.  No axillary or clavicular LA.     Musculoskeletal: Normal range of motion.        General: No tenderness.     Right lower leg: No edema.     Left lower leg: No edema.  Lymphadenopathy:     Cervical: No cervical adenopathy.  Skin:    General: Skin is warm and dry.     Coloration: Skin is not pale.     Findings: No erythema or rash.     Comments: Solar lentigines diffusely Mildly tanned  Neurological:     Mental Status: She is alert.     Cranial Nerves: No cranial nerve deficit.     Motor: No abnormal muscle  tone.     Coordination: Coordination normal.     Gait: Gait normal.     Deep Tendon Reflexes: Reflexes are normal and symmetric. Reflexes normal.  Psychiatric:        Mood and Affect: Mood normal.           Assessment & Plan:   Problem  List Items Addressed This Visit      Endocrine   Hypothyroidism    Hypothyroidism  Pt has no clinical changes No change in energy level/ hair or skin/ edema and no tremor Lab Results  Component Value Date   TSH 3.88 11/22/2018          Relevant Medications   levothyroxine (SYNTHROID) 88 MCG tablet     Musculoskeletal and Integument   Osteopenia    2/20 -worsened osteopenia on dexa  Disc need for calcium/ vitamin D/ wt bearing exercise and bone density test every 2 y to monitor Disc safety/ fracture risk in detail           Other   Hyperlipidemia    Disc goals for lipids and reasons to control them Rev last labs with pt Rev low sat fat diet in detail LDL is improved but still high  Good diet  Pt is open to trying statin  crestor 10 mg daily-disc poss side eff/will update Re check lipids in about 2 mo      Relevant Medications   rosuvastatin (CRESTOR) 10 MG tablet   Other Relevant Orders   Lipid panel   ALT   AST   Screening mammogram, encounter for    Pt plans to schedule her own mammogram Nl breast exam today      Routine general medical examination at a health care facility    Reviewed health habits including diet and exercise and skin cancer prevention Reviewed appropriate screening tests for age  Also reviewed health mt list, fam hx and immunization status , as well as social and family history   See HPI Labs reviewed  Flu and pna 23 vaccines today  Pt is looking into coverage of shingrix  Had yearly derm visit  Given materials to work on adv directive No cognitive c/o  Hearing and vision exams- reassuring  Sent for her recent colonoscopy report       Colon cancer screening    She had a colonoscopy out of office in the fall  We will call for that result Per pt-normal      Medicare annual wellness visit, initial - Primary    Reviewed health habits including diet and exercise and skin cancer prevention Reviewed appropriate screening tests for  age  Also reviewed health mt list, fam hx and immunization status , as well as social and family history   See HPI Labs reviewed  Flu and pna 23 vaccines today  Pt is looking into coverage of shingrix  Had yearly derm visit  Given materials to work on adv directive No cognitive c/o  Hearing and vision exams- reassuring  Sent for her recent colonoscopy report          Other Visit Diagnoses    Need for influenza vaccination       Relevant Orders   Flu Vaccine QUAD 6+ mos PF IM (Fluarix Quad PF) (Completed)   Need for 23-polyvalent pneumococcal polysaccharide vaccine       Relevant Orders   Pneumococcal polysaccharide vaccine 23-valent greater than or equal to 2yo subcutaneous/IM (Completed)

## 2018-11-25 NOTE — Assessment & Plan Note (Signed)
Pt plans to schedule her own mammogram Nl breast exam today

## 2018-11-25 NOTE — Assessment & Plan Note (Signed)
2/20 -worsened osteopenia on dexa  Disc need for calcium/ vitamin D/ wt bearing exercise and bone density test every 2 y to monitor Disc safety/ fracture risk in detail

## 2018-11-25 NOTE — Patient Instructions (Addendum)
Don't forget to schedule your mammogram   Flu shot and pneumovax 23 today   If you are interested in the new shingles vaccine (Shingrix) - call your local pharmacy to check on coverage and availability  If affordable, get on a wait list at your pharmacy to get the vaccine.  Please work on your advance directive   For dementia care giving- check out the book "The 36 Hour Day"  Start crestor 10 mg daily in the evening - and let's re check cholesterol in 2 months

## 2018-12-29 ENCOUNTER — Encounter: Payer: Self-pay | Admitting: Family Medicine

## 2019-02-07 ENCOUNTER — Ambulatory Visit: Payer: Medicare HMO | Admitting: Family Medicine

## 2019-03-15 ENCOUNTER — Other Ambulatory Visit: Payer: Self-pay | Admitting: Family Medicine

## 2019-03-15 DIAGNOSIS — Z1231 Encounter for screening mammogram for malignant neoplasm of breast: Secondary | ICD-10-CM

## 2019-03-25 ENCOUNTER — Other Ambulatory Visit: Payer: Self-pay

## 2019-03-25 ENCOUNTER — Ambulatory Visit
Admission: RE | Admit: 2019-03-25 | Discharge: 2019-03-25 | Disposition: A | Discharge status: Home / Self Care | Payer: Medicare HMO | Source: Ambulatory Visit | Attending: Family Medicine | Admitting: Family Medicine

## 2019-03-25 DIAGNOSIS — Z1231 Encounter for screening mammogram for malignant neoplasm of breast: Secondary | ICD-10-CM | POA: Diagnosis not present

## 2019-04-21 DIAGNOSIS — S83412A Sprain of medial collateral ligament of left knee, initial encounter: Secondary | ICD-10-CM | POA: Diagnosis not present

## 2019-04-21 DIAGNOSIS — M25562 Pain in left knee: Secondary | ICD-10-CM | POA: Diagnosis not present

## 2019-04-21 DIAGNOSIS — W1789XA Other fall from one level to another, initial encounter: Secondary | ICD-10-CM | POA: Diagnosis not present

## 2019-05-06 DIAGNOSIS — M25562 Pain in left knee: Secondary | ICD-10-CM | POA: Diagnosis not present

## 2019-05-06 DIAGNOSIS — S83412A Sprain of medial collateral ligament of left knee, initial encounter: Secondary | ICD-10-CM | POA: Diagnosis not present

## 2019-05-12 DIAGNOSIS — H25093 Other age-related incipient cataract, bilateral: Secondary | ICD-10-CM | POA: Diagnosis not present

## 2019-05-12 DIAGNOSIS — H40052 Ocular hypertension, left eye: Secondary | ICD-10-CM | POA: Diagnosis not present

## 2019-05-23 DIAGNOSIS — M6281 Muscle weakness (generalized): Secondary | ICD-10-CM | POA: Diagnosis not present

## 2019-05-23 DIAGNOSIS — M25662 Stiffness of left knee, not elsewhere classified: Secondary | ICD-10-CM | POA: Diagnosis not present

## 2019-05-23 DIAGNOSIS — M25562 Pain in left knee: Secondary | ICD-10-CM | POA: Diagnosis not present

## 2019-05-23 DIAGNOSIS — G8929 Other chronic pain: Secondary | ICD-10-CM | POA: Diagnosis not present

## 2019-05-30 DIAGNOSIS — G8929 Other chronic pain: Secondary | ICD-10-CM | POA: Diagnosis not present

## 2019-05-30 DIAGNOSIS — M25562 Pain in left knee: Secondary | ICD-10-CM | POA: Diagnosis not present

## 2019-06-01 DIAGNOSIS — Z0100 Encounter for examination of eyes and vision without abnormal findings: Secondary | ICD-10-CM | POA: Diagnosis not present

## 2019-06-06 DIAGNOSIS — G8929 Other chronic pain: Secondary | ICD-10-CM | POA: Diagnosis not present

## 2019-06-06 DIAGNOSIS — M25562 Pain in left knee: Secondary | ICD-10-CM | POA: Diagnosis not present

## 2019-06-13 DIAGNOSIS — M25562 Pain in left knee: Secondary | ICD-10-CM | POA: Diagnosis not present

## 2019-06-13 DIAGNOSIS — G8929 Other chronic pain: Secondary | ICD-10-CM | POA: Diagnosis not present

## 2019-06-13 DIAGNOSIS — M6281 Muscle weakness (generalized): Secondary | ICD-10-CM | POA: Diagnosis not present

## 2019-06-13 DIAGNOSIS — M25662 Stiffness of left knee, not elsewhere classified: Secondary | ICD-10-CM | POA: Diagnosis not present

## 2019-06-20 DIAGNOSIS — M25562 Pain in left knee: Secondary | ICD-10-CM | POA: Diagnosis not present

## 2019-06-20 DIAGNOSIS — M6281 Muscle weakness (generalized): Secondary | ICD-10-CM | POA: Diagnosis not present

## 2019-06-20 DIAGNOSIS — M25662 Stiffness of left knee, not elsewhere classified: Secondary | ICD-10-CM | POA: Diagnosis not present

## 2019-06-20 DIAGNOSIS — G8929 Other chronic pain: Secondary | ICD-10-CM | POA: Diagnosis not present

## 2019-06-27 DIAGNOSIS — G8929 Other chronic pain: Secondary | ICD-10-CM | POA: Diagnosis not present

## 2019-06-27 DIAGNOSIS — M25562 Pain in left knee: Secondary | ICD-10-CM | POA: Diagnosis not present

## 2019-07-04 DIAGNOSIS — G8929 Other chronic pain: Secondary | ICD-10-CM | POA: Diagnosis not present

## 2019-07-04 DIAGNOSIS — M25562 Pain in left knee: Secondary | ICD-10-CM | POA: Diagnosis not present

## 2019-11-28 ENCOUNTER — Encounter: Payer: Medicare HMO | Admitting: Family Medicine

## 2019-12-29 ENCOUNTER — Other Ambulatory Visit: Payer: Self-pay

## 2019-12-29 ENCOUNTER — Ambulatory Visit (INDEPENDENT_AMBULATORY_CARE_PROVIDER_SITE_OTHER): Payer: Medicare HMO | Admitting: Family Medicine

## 2019-12-29 ENCOUNTER — Encounter: Payer: Self-pay | Admitting: Family Medicine

## 2019-12-29 VITALS — BP 118/80 | HR 61 | Temp 97.0°F | Ht 65.75 in | Wt 160.1 lb

## 2019-12-29 DIAGNOSIS — Z23 Encounter for immunization: Secondary | ICD-10-CM | POA: Diagnosis not present

## 2019-12-29 DIAGNOSIS — E7800 Pure hypercholesterolemia, unspecified: Secondary | ICD-10-CM

## 2019-12-29 DIAGNOSIS — M8580 Other specified disorders of bone density and structure, unspecified site: Secondary | ICD-10-CM | POA: Diagnosis not present

## 2019-12-29 DIAGNOSIS — E039 Hypothyroidism, unspecified: Secondary | ICD-10-CM

## 2019-12-29 DIAGNOSIS — Z0000 Encounter for general adult medical examination without abnormal findings: Secondary | ICD-10-CM

## 2019-12-29 LAB — COMPREHENSIVE METABOLIC PANEL
ALT: 17 U/L (ref 0–35)
AST: 19 U/L (ref 0–37)
Albumin: 4.3 g/dL (ref 3.5–5.2)
Alkaline Phosphatase: 72 U/L (ref 39–117)
BUN: 16 mg/dL (ref 6–23)
CO2: 29 mEq/L (ref 19–32)
Calcium: 9.4 mg/dL (ref 8.4–10.5)
Chloride: 102 mEq/L (ref 96–112)
Creatinine, Ser: 0.89 mg/dL (ref 0.40–1.20)
GFR: 63.19 mL/min (ref >60.00)
Glucose, Bld: 91 mg/dL (ref 70–99)
Potassium: 4.4 mEq/L (ref 3.5–5.1)
Sodium: 138 mEq/L (ref 135–145)
Total Bilirubin: 1.2 mg/dL (ref 0.2–1.2)
Total Protein: 7 g/dL (ref 6.0–8.3)

## 2019-12-29 LAB — CBC WITH DIFFERENTIAL/PLATELET
Basophils Absolute: 0 10*3/uL (ref 0.0–0.1)
Basophils Relative: 1.1 % (ref 0.0–3.0)
Eosinophils Absolute: 0.1 10*3/uL (ref 0.0–0.7)
Eosinophils Relative: 1.7 % (ref 0.0–5.0)
HCT: 45 % (ref 36.0–46.0)
Hemoglobin: 14.7 g/dL (ref 12.0–15.0)
Lymphocytes Relative: 36.9 % (ref 12.0–46.0)
Lymphs Abs: 1.6 10*3/uL (ref 0.7–4.0)
MCHC: 32.6 g/dL (ref 30.0–36.0)
MCV: 96.1 fl (ref 78.0–100.0)
Monocytes Absolute: 0.4 10*3/uL (ref 0.1–1.0)
Monocytes Relative: 9.6 % (ref 3.0–12.0)
Neutro Abs: 2.2 10*3/uL (ref 1.4–7.7)
Neutrophils Relative %: 50.7 % (ref 43.0–77.0)
Platelets: 285 10*3/uL (ref 150.0–400.0)
RBC: 4.68 Mil/uL (ref 3.87–5.11)
RDW: 13 % (ref 11.5–15.5)
WBC: 4.3 10*3/uL (ref 4.0–10.5)

## 2019-12-29 LAB — TSH: TSH: 2.86 u[IU]/mL (ref 0.35–4.50)

## 2019-12-29 LAB — LIPID PANEL
Cholesterol: 265 mg/dL — ABNORMAL HIGH (ref 0–200)
HDL: 62.1 mg/dL (ref >39.00)
LDL Cholesterol: 183 mg/dL — ABNORMAL HIGH (ref 0–99)
NonHDL: 203.12
Total CHOL/HDL Ratio: 4
Triglycerides: 102 mg/dL (ref 0.0–149.0)
VLDL: 20.4 mg/dL (ref 0.0–40.0)

## 2019-12-29 MED ORDER — LEVOTHYROXINE SODIUM 88 MCG PO TABS: 88.0000 ug | ORAL_TABLET | Freq: Every day | ORAL | 3 refills | Status: DC

## 2019-12-29 NOTE — Progress Notes (Signed)
Subjective:    Patient ID: Katrina [name], female    DOB: 1951/02/17, 69 y.o.   MRN: [id number]  This visit occurred during the SARS-CoV-2 public health emergency.  Safety protocols were in place, including screening questions prior to the visit, additional usage of staff PPE, and extensive cleaning of exam room while observing appropriate contact time as indicated for disinfecting solutions.    HPI Pt presents for amw and health mt exam with review of chronic health problems   I have personally reviewed the Medicare Annual Wellness questionnaire and have noted 1. The patient's medical and social history 2. Their use of alcohol, tobacco or illicit drugs 3. Their current medications and supplements 4. The patient's functional ability including ADL's, fall risks, home safety risks and hearing or visual             impairment. 5. Diet and physical activities 6. Evidence for depression or mood disorders  The patients weight, height, BMI have been recorded in the chart and visual acuity is per eye clinic.  I have made referrals, counseling and provided education to the patient based review of the above and I have provided the pt with a written personalized care plan for preventive services. Reviewed and updated provider list, see scanned forms.  See scanned forms.  Routine anticipatory guidance given to patient.  See health maintenance. Colon cancer screening 02/09/18 Dr Elliot-hyperplastic polyp Breast cancer screening mammogram 12/20 Self breast exam-no lumps  Flu vaccine - wants to do today  Tetanus vaccine Tdap 5/13 Pneumovax completed covid status - has had the vaccine (both) - pfizer  Zoster vaccine-intereseted  Dexa 2/20 - osteopenia / worsened slt  Falls -had a fall off of a ladder in January (injured knee- not bad)  Fractures- none Supplements- takes ca and D  [facility]  PHQ 0 Advance directive-does not have  Cognitive function addressed- see scanned  forms- and if abnormal then additional documentation follows.  No difficulties     PMH and SH reviewed  Meds, vitals, and allergies reviewed.   ROS: See HPI.  Otherwise negative.    Care team Dyneisha Murchison- pcp [facility] Nicole Kindred- derm  Weight : Wt Readings from Last 3 Encounters:  12/29/19 160 lb 2 oz (72.6 kg)  11/25/18 159 lb 8 oz (72.3 kg)  06/24/18 155 lb 6 oz (70.5 kg)   26.04 kg/m   Hearing/vision:  Hearing Screening   Method: Audiometry   125Hz  250Hz  500Hz  1000Hz  2000Hz  3000Hz  4000Hz  6000Hz  8000Hz   Right ear:   20 20 20  20     Left ear:   40 25 20      Vision Screening Comments: Eye exam 04/2019  She has not recognized any hearing loss ? Ear stopped up   BP Readings from Last 3 Encounters:  12/29/19 118/80  11/25/18 110/68  06/24/18 122/76   Pulse Readings from Last 3 Encounters:  12/29/19 61  11/25/18 (!) 54  06/24/18 78    Hypothyroid Lab Results  Component Value Date   TSH 3.88 11/22/2018    Due for labs  Taking levothyroxine 88 mcg   Hyperlipidemia Lab Results  Component Value Date   CHOL 249 (H) 11/22/2018   HDL 62.10 11/22/2018   LDLCALC 167 (H) 11/22/2018   LDLDIRECT 184.1 03/28/2013   TRIG 96.0 11/22/2018   CHOLHDL 4 11/22/2018   Tried crestor- caused leg pain (10 mg) as well as shoulder pain   Flipping a house right now  Very  busy Too busy to cook   Patient Active Problem List   Diagnosis Date Noted  . Medicare annual wellness visit, subsequent 11/25/2018  . Welcome to Medicare preventive visit 11/23/2017  . Encounter for routine gynecological examination 09/28/2015  . Estrogen deficiency 09/28/2015  . Colon cancer screening 03/28/2013  . Routine general medical examination at a health care facility 08/15/2011  . Gynecological examination 08/15/2011  . Post-menopausal 08/15/2011  . Screening mammogram, encounter for 01/09/2011  . Osteopenia 01/29/2007  . Hypothyroidism 01/12/2007  . Hyperlipidemia 01/12/2007  .  HEEL SPUR 01/12/2007   Past Medical History:  Diagnosis Date  . Heel spur   . HLD (hyperlipidemia)   . Hypothyroid   . Osteopenia    stress fracture in foot  . Plantar fasciitis   . Stress fracture of foot    No past surgical history on file. Social History   Tobacco Use  . Smoking status: Never Smoker  . Smokeless tobacco: Never Used  Substance Use Topics  . Alcohol use: Yes    Alcohol/week: 0.0 standard drinks    Comment: Occasional  . Drug use: No   Family History  Problem Relation Age of Onset  . Myelodysplastic syndrome Father   . Hypertension Mother   . Thyroid disease Mother   . Cancer Other        GM--gallbladder  . Lung cancer Other        GF  . Colon cancer Other        GF  . Osteoporosis Other        GM  . Breast cancer Neg Hx    Allergies  Allergen Reactions  . Advil [Ibuprofen]     Not an allergy but "feels awful the next day."  . Clindamycin/Lincomycin     Caused C-diff  . Codeine     REACTION: nausea and vomiting  . Crestor [Rosuvastatin]     Muscle pain at 10 daily   Current Outpatient Medications on File Prior to Visit  Medication Sig Dispense Refill  . albuterol (PROVENTIL HFA;VENTOLIN HFA) 108 (90 Base) MCG/ACT inhaler Inhale 2 puffs into the lungs every 4 (four) hours as needed for wheezing. 1 Inhaler 5  . APPLE CIDER VINEGAR PO Take by mouth 3 (three) times daily after meals. gummies    . Ascorbic Acid (VITAMIN C PO) Take 1 tablet by mouth daily.    . Calcium-Magnesium-Zinc (CALCIUM-MAGNESUIUM-ZINC PO) Take 1 capsule by mouth daily.    . Cholecalciferol (VITAMIN D3) 2000 units TABS Take 2 tablets by mouth daily.     . Multiple Vitamin (MULTIVITAMIN) tablet Take 1 tablet by mouth daily.    . Omega-3 Fatty Acids (FISH OIL PO) Take 1 tablet by mouth daily.     No current facility-administered medications on file prior to visit.    Review of Systems  Constitutional: Negative for activity change, appetite change, fatigue, fever and  unexpected weight change.  HENT: Negative for congestion, ear pain, rhinorrhea, sinus pressure and sore throat.   Eyes: Negative for pain, redness and visual disturbance.  Respiratory: Negative for cough, shortness of breath and wheezing.   Cardiovascular: Negative for chest pain and palpitations.  Gastrointestinal: Negative for abdominal pain, blood in stool, constipation and diarrhea.  Endocrine: Negative for polydipsia and polyuria.  Genitourinary: Negative for dysuria, frequency and urgency.  Musculoskeletal: Negative for arthralgias, back pain and myalgias.  Skin: Negative for pallor and rash.  Allergic/Immunologic: Negative for environmental allergies.  Neurological: Negative for dizziness, syncope and headaches.  Hematological: Negative for adenopathy. Does not bruise/bleed easily.  Psychiatric/Behavioral: Negative for decreased concentration and dysphoric mood. The patient is not nervous/anxious.        Objective:   Physical Exam Constitutional:      General: She is not in acute distress.    Appearance: Normal appearance. She is well-developed and normal weight. She is not ill-appearing or diaphoretic.  HENT:     Head: Normocephalic and atraumatic.     Right Ear: Tympanic membrane, ear canal and external ear normal.     Left Ear: Tympanic membrane, ear canal and external ear normal.     Nose: Nose normal. No congestion.     Mouth/Throat:     Mouth: Mucous membranes are moist.     Pharynx: Oropharynx is clear. No posterior oropharyngeal erythema.  Eyes:     General: No scleral icterus.    Extraocular Movements: Extraocular movements intact.     Conjunctiva/sclera: Conjunctivae normal.     Pupils: Pupils are equal, round, and reactive to light.  Neck:     Thyroid: No thyromegaly.     Vascular: No carotid bruit or JVD.  Cardiovascular:     Rate and Rhythm: Normal rate and regular rhythm.     Pulses: Normal pulses.     Heart sounds: Normal heart sounds. No gallop.     Pulmonary:     Effort: Pulmonary effort is normal. No respiratory distress.     Breath sounds: Normal breath sounds. No wheezing.     Comments: Good air exch Chest:     Chest wall: No tenderness.  Abdominal:     General: Bowel sounds are normal. There is no distension or abdominal bruit.     Palpations: Abdomen is soft. There is no mass.     Tenderness: There is no abdominal tenderness.     Hernia: No hernia is present.  Genitourinary:    Comments: Breast exam: No mass, nodules, thickening, tenderness, bulging, retraction, inflamation, nipple discharge or skin changes noted.  No axillary or clavicular LA.     Musculoskeletal:        General: No tenderness. Normal range of motion.     Cervical back: Normal range of motion and neck supple. No rigidity. No muscular tenderness.     Right lower leg: No edema.     Left lower leg: No edema.     Comments: No kyphosis   Lymphadenopathy:     Cervical: No cervical adenopathy.  Skin:    General: Skin is warm and dry.     Coloration: Skin is not pale.     Findings: No erythema or rash.  Neurological:     Mental Status: She is alert. Mental status is at baseline.     Cranial Nerves: No cranial nerve deficit.     Motor: No abnormal muscle tone.     Coordination: Coordination normal.     Gait: Gait normal.     Deep Tendon Reflexes: Reflexes are normal and symmetric. Reflexes normal.  Psychiatric:        Mood and Affect: Mood normal.        Cognition and Memory: Cognition and memory normal.           Assessment & Plan:   Problem List Items Addressed This Visit      Endocrine   Hypothyroidism    Hypothyroidism  Pt has no clinical changes No change in energy level/ hair or skin/ edema and no tremor TSH today  Relevant Medications   levothyroxine (SYNTHROID) 88 MCG tablet   Other Relevant Orders   TSH (Completed)     Musculoskeletal and Integument   Osteopenia    Rev dexa 2/20 One fall and no fractures Disc fall  prev Taking ca and D  Very good exercise         Other   Hyperlipidemia    Disc goals for lipids and reasons to control them Rev last labs with pt Rev low sat fat diet in detail Labs today  Pt stopped crestor due to muscle/joint pain       Relevant Orders   Comprehensive metabolic panel (Completed)   Lipid panel (Completed)   Routine general medical examination at a health care facility    Reviewed health habits including diet and exercise and skin cancer prevention Reviewed appropriate screening tests for age  Also reviewed health mt list, fam hx and immunization status , as well as social and family history   See HPI Flu vaccine given today  covid immunized Discussed shingrix vaccine  One fall- disc fall prev  Taking ca and D and exercising  Has utd advance directive No cognitive concerns Hearing screen-not optimal /pt has not noticed hearing problems and no cerumen  Vision/eye exam are up to date  Labs ordered today  Enc better diet       Relevant Orders   CBC with Differential/Platelet (Completed)   Medicare annual wellness visit, subsequent - Primary    Reviewed health habits including diet and exercise and skin cancer prevention Reviewed appropriate screening tests for age  Also reviewed health mt list, fam hx and immunization status , as well as social and family history   See HPI Flu vaccine given today  covid immunized Discussed shingrix vaccine  One fall- disc fall prev  Taking ca and D and exercising  Has utd advance directive No cognitive concerns Hearing screen-not optimal /pt has not noticed hearing problems and no cerumen  Vision/eye exam are up to date  Labs ordered today  Enc better diet         Other Visit Diagnoses    Need for influenza vaccination       Relevant Orders   Flu Vaccine QUAD High Dose(Fluad) (Completed)

## 2019-12-29 NOTE — Assessment & Plan Note (Signed)
Disc goals for lipids and reasons to control them Rev last labs with pt Rev low sat fat diet in detail Labs today  Pt stopped crestor due to muscle/joint pain

## 2019-12-29 NOTE — Patient Instructions (Addendum)
If you are interested in the new shingles vaccine (Shingrix) - call your local pharmacy to check on coverage and availability  If affordable, get on a wait list at your pharmacy to get the vaccine.  Work on an Forensic scientist   Flu shot today  Labs today   Keep up the good work  Avoid red meat/ fried foods/ egg yolks/ fatty breakfast meats/ butter, cheese and high fat dairy/ and shellfish

## 2019-12-29 NOTE — Assessment & Plan Note (Signed)
Rev dexa 2/20 One fall and no fractures Disc fall prev Taking ca and D  Very good exercise

## 2019-12-29 NOTE — Assessment & Plan Note (Signed)
Reviewed health habits including diet and exercise and skin cancer prevention Reviewed appropriate screening tests for age  Also reviewed health mt list, fam hx and immunization status , as well as social and family history   See HPI Flu vaccine given today  covid immunized Discussed shingrix vaccine  One fall- disc fall prev  Taking ca and D and exercising  Has utd advance directive No cognitive concerns Hearing screen-not optimal /pt has not noticed hearing problems and no cerumen  Vision/eye exam are up to date  Labs ordered today  Enc better diet

## 2019-12-29 NOTE — Assessment & Plan Note (Signed)
Hypothyroidism  Pt has no clinical changes No change in energy level/ hair or skin/ edema and no tremor TSH today

## 2019-12-30 ENCOUNTER — Other Ambulatory Visit: Payer: Self-pay | Admitting: Family Medicine

## 2019-12-30 NOTE — Telephone Encounter (Signed)
Let pt know pharmacy is changing brand (may already know) and then refill is fine

## 2019-12-30 NOTE — Telephone Encounter (Signed)
Different brand, please advise

## 2019-12-31 ENCOUNTER — Telehealth: Payer: Self-pay | Admitting: Family Medicine

## 2019-12-31 DIAGNOSIS — E7800 Pure hypercholesterolemia, unspecified: Secondary | ICD-10-CM

## 2019-12-31 MED ORDER — EZETIMIBE 10 MG PO TABS: 10.0000 mg | ORAL_TABLET | Freq: Every day | ORAL | 1 refills | Status: DC

## 2019-12-31 NOTE — Telephone Encounter (Signed)
-----   Message from Tammi Sou, [place] sent at 12/30/2019  5:05 PM EDT ----- Pt notified of Dr. Glori Bickers comments. Pt is open to trying Zetia, please send in Rx. [place]

## 2019-12-31 NOTE — Telephone Encounter (Signed)
I sent it  Take one daily  Alert me if any problems or side effects   Re check fasting lipids in 4-6 weeks

## 2020-01-02 NOTE — Telephone Encounter (Signed)
Pt notified Rx sent and advise of Dr. Marliss Coots comments and lab appt scheduled

## 2020-02-20 ENCOUNTER — Other Ambulatory Visit (INDEPENDENT_AMBULATORY_CARE_PROVIDER_SITE_OTHER): Payer: Medicare HMO

## 2020-02-20 ENCOUNTER — Other Ambulatory Visit: Payer: Self-pay

## 2020-02-20 DIAGNOSIS — E7800 Pure hypercholesterolemia, unspecified: Secondary | ICD-10-CM | POA: Diagnosis not present

## 2020-02-20 LAB — LIPID PANEL
Cholesterol: 198 mg/dL (ref 0–200)
HDL: 61.7 mg/dL (ref >39.00)
LDL Cholesterol: 109 mg/dL — ABNORMAL HIGH (ref 0–99)
NonHDL: 135.99
Total CHOL/HDL Ratio: 3
Triglycerides: 136 mg/dL (ref 0.0–149.0)
VLDL: 27.2 mg/dL (ref 0.0–40.0)

## 2020-02-29 ENCOUNTER — Other Ambulatory Visit: Payer: Self-pay | Admitting: Family Medicine

## 2020-03-08 DIAGNOSIS — M5416 Radiculopathy, lumbar region: Secondary | ICD-10-CM | POA: Diagnosis not present

## 2020-03-08 DIAGNOSIS — M5137 Other intervertebral disc degeneration, lumbosacral region: Secondary | ICD-10-CM | POA: Diagnosis not present

## 2020-03-08 DIAGNOSIS — M5442 Lumbago with sciatica, left side: Secondary | ICD-10-CM | POA: Diagnosis not present

## 2020-03-08 DIAGNOSIS — M47816 Spondylosis without myelopathy or radiculopathy, lumbar region: Secondary | ICD-10-CM | POA: Diagnosis not present

## 2020-03-08 DIAGNOSIS — M419 Scoliosis, unspecified: Secondary | ICD-10-CM | POA: Diagnosis not present

## 2020-03-29 DIAGNOSIS — Z20822 Contact with and (suspected) exposure to covid-19: Secondary | ICD-10-CM | POA: Diagnosis not present

## 2020-04-02 DIAGNOSIS — M7052 Other bursitis of knee, left knee: Secondary | ICD-10-CM | POA: Diagnosis not present

## 2020-04-02 DIAGNOSIS — W19XXXD Unspecified fall, subsequent encounter: Secondary | ICD-10-CM | POA: Diagnosis not present

## 2020-04-02 DIAGNOSIS — M25462 Effusion, left knee: Secondary | ICD-10-CM | POA: Diagnosis not present

## 2020-04-02 DIAGNOSIS — S83412D Sprain of medial collateral ligament of left knee, subsequent encounter: Secondary | ICD-10-CM | POA: Diagnosis not present

## 2020-04-02 DIAGNOSIS — M25562 Pain in left knee: Secondary | ICD-10-CM | POA: Diagnosis not present

## 2020-04-07 ENCOUNTER — Other Ambulatory Visit: Payer: Self-pay | Admitting: Family Medicine

## 2020-04-09 ENCOUNTER — Other Ambulatory Visit: Payer: Self-pay | Admitting: Sports Medicine

## 2020-04-09 DIAGNOSIS — M7052 Other bursitis of knee, left knee: Secondary | ICD-10-CM

## 2020-04-09 DIAGNOSIS — M25462 Effusion, left knee: Secondary | ICD-10-CM

## 2020-04-09 DIAGNOSIS — S83412S Sprain of medial collateral ligament of left knee, sequela: Secondary | ICD-10-CM

## 2020-04-09 DIAGNOSIS — M25562 Pain in left knee: Secondary | ICD-10-CM

## 2020-04-12 ENCOUNTER — Other Ambulatory Visit: Payer: Self-pay | Admitting: Family Medicine

## 2020-04-12 DIAGNOSIS — Z1231 Encounter for screening mammogram for malignant neoplasm of breast: Secondary | ICD-10-CM

## 2020-04-18 ENCOUNTER — Other Ambulatory Visit: Payer: Self-pay

## 2020-04-18 ENCOUNTER — Ambulatory Visit
Admission: RE | Admit: 2020-04-18 | Discharge: 2020-04-18 | Disposition: A | Discharge status: Home / Self Care | Payer: Medicare HMO | Source: Ambulatory Visit | Attending: Sports Medicine | Admitting: Sports Medicine

## 2020-04-18 DIAGNOSIS — M25562 Pain in left knee: Secondary | ICD-10-CM | POA: Diagnosis not present

## 2020-04-18 DIAGNOSIS — S83242A Other tear of medial meniscus, current injury, left knee, initial encounter: Secondary | ICD-10-CM | POA: Diagnosis not present

## 2020-04-18 DIAGNOSIS — M25462 Effusion, left knee: Secondary | ICD-10-CM | POA: Diagnosis not present

## 2020-04-18 DIAGNOSIS — S83412S Sprain of medial collateral ligament of left knee, sequela: Secondary | ICD-10-CM | POA: Insufficient documentation

## 2020-04-18 DIAGNOSIS — R600 Localized edema: Secondary | ICD-10-CM | POA: Diagnosis not present

## 2020-04-18 DIAGNOSIS — M7052 Other bursitis of knee, left knee: Secondary | ICD-10-CM | POA: Diagnosis not present

## 2020-04-18 DIAGNOSIS — M1712 Unilateral primary osteoarthritis, left knee: Secondary | ICD-10-CM | POA: Diagnosis not present

## 2020-04-19 DIAGNOSIS — H6982 Other specified disorders of Eustachian tube, left ear: Secondary | ICD-10-CM | POA: Diagnosis not present

## 2020-04-19 DIAGNOSIS — H90A22 Sensorineural hearing loss, unilateral, left ear, with restricted hearing on the contralateral side: Secondary | ICD-10-CM | POA: Diagnosis not present

## 2020-04-19 DIAGNOSIS — H9202 Otalgia, left ear: Secondary | ICD-10-CM | POA: Diagnosis not present

## 2020-04-24 DIAGNOSIS — S83242A Other tear of medial meniscus, current injury, left knee, initial encounter: Secondary | ICD-10-CM | POA: Diagnosis not present

## 2020-04-24 DIAGNOSIS — M25562 Pain in left knee: Secondary | ICD-10-CM | POA: Diagnosis not present

## 2020-04-26 ENCOUNTER — Other Ambulatory Visit: Payer: Self-pay | Admitting: Orthopedic Surgery

## 2020-04-30 ENCOUNTER — Other Ambulatory Visit
Admission: RE | Admit: 2020-04-30 | Discharge: 2020-04-30 | Disposition: A | Discharge status: Home / Self Care | Payer: Medicare HMO | Source: Ambulatory Visit | Attending: Orthopedic Surgery | Admitting: Orthopedic Surgery

## 2020-04-30 ENCOUNTER — Other Ambulatory Visit: Payer: Self-pay

## 2020-04-30 DIAGNOSIS — Z01812 Encounter for preprocedural laboratory examination: Secondary | ICD-10-CM | POA: Insufficient documentation

## 2020-04-30 DIAGNOSIS — Z20822 Contact with and (suspected) exposure to covid-19: Secondary | ICD-10-CM | POA: Diagnosis not present

## 2020-04-30 HISTORY — DX: Unspecified asthma, uncomplicated: J45.909

## 2020-04-30 LAB — SARS CORONAVIRUS 2 (TAT 6-24 HRS): SARS Coronavirus 2: NEGATIVE

## 2020-04-30 NOTE — Patient Instructions (Signed)
Your procedure is scheduled on:01/26/22[facility] Report to the Registration Desk on the 1st floor of the [facility]. To find out your arrival time, please call [phone] between 1PM - 3PM on: 05/01/20- TUESDAY  REMEMBER: Instructions that are not followed completely may result in serious medical risk, up to and including death; or upon the discretion of your surgeon and anesthesiologist your surgery may need to be rescheduled.  Do not eat food after midnight the night before surgery.  No gum chewing, lozengers or hard candies.  You may however, drink CLEAR liquids up to 2 hours before you are scheduled to arrive for your surgery. Do not drink anything within 2 hours of your scheduled arrival time.  Clear liquids include: - water  - apple juice without pulp - gatorade (not RED, PURPLE, OR BLUE) - black coffee or tea (Do NOT add milk or creamers to the coffee or tea) Do NOT drink anything that is not on this list.  Type 1 and Type 2 diabetics should only drink water.  In addition, your doctor has ordered for you to drink the provided  Ensure Pre-Surgery Clear Carbohydrate Drink  Drinking this carbohydrate drink up to two hours before surgery helps to reduce insulin resistance and improve patient outcomes. Please complete drinking 2 hours prior to scheduled arrival time.  TAKE THESE MEDICATIONS THE MORNING OF SURGERY WITH A SIP OF WATER: - EUTHYROX 88 MCG tablet   Use albuterol (PROVENTIL HFA;VENTOLIN HFA) 108 (90 Base) MCG/ACT inhaler on the day of surgery and bring to the hospital.  One week prior to surgery: Stop Anti-inflammatories (NSAIDS) such as Advil, Aleve, Ibuprofen, Motrin, Naproxen, Naprosyn and Aspirin based products such as Excedrin, Goodys Powder, BC Powder.  Stop ANY OVER THE COUNTER supplements until after surgery .Calcium-Magnesium-Zinc (CAL-MAG-ZINC PO), vitamin C (ASCORBIC ACID) 500 MG tablet (However, you may continue taking Vitamin D, Vitamin B, and  multivitamin up until the day before surgery.)  No Alcohol for 24 hours before or after surgery.  No Smoking including e-cigarettes for 24 hours prior to surgery.  No chewable tobacco products for at least 6 hours prior to surgery.  No nicotine patches on the day of surgery.  Do not use any "recreational" drugs for at least a week prior to your surgery.  Please be advised that the combination of cocaine and anesthesia may have negative outcomes, up to and including death. If you test positive for cocaine, your surgery will be cancelled.  On the morning of surgery brush your teeth with toothpaste and water, you may rinse your mouth with mouthwash if you wish. Do not swallow any toothpaste or mouthwash.  Do not wear jewelry, make-up, hairpins, clips or nail polish.  Do not wear lotions, powders, or perfumes.   Do not shave body from the neck down 48 hours prior to surgery just in case you cut yourself which could leave a site for infection.  Also, freshly shaved skin may become irritated if using the CHG soap.  Contact lenses, hearing aids and dentures may not be worn into surgery.  Do not bring valuables to the hospital. [facility] is not responsible for any missing/lost belongings or valuables.   Use CHG Soap or wipes as directed on instruction sheet.  Notify your doctor if there is any change in your medical condition (cold, fever, infection).  Wear comfortable clothing (specific to your surgery type) to the hospital.  Plan for stool softeners for home use; pain medications have a tendency to cause  constipation. You can also help prevent constipation by eating foods high in fiber such as fruits and vegetables and drinking plenty of fluids as your diet allows.  After surgery, you can help prevent lung complications by doing breathing exercises.  Take deep breaths and cough every 1-2 hours. Your doctor may order a device called an Incentive Spirometer to help you take deep  breaths. When coughing or sneezing, hold a pillow firmly against your incision with both hands. This is called "splinting." Doing this helps protect your incision. It also decreases belly discomfort.  If you are being admitted to the hospital overnight, leave your suitcase in the car. After surgery it may be brought to your room.  If you are being discharged the day of surgery, you will not be allowed to drive home. You will need a responsible adult (18 years or older) to drive you home and stay with you that night.   If you are taking public transportation, you will need to have a responsible adult (18 years or older) with you. Please confirm with your physician that it is acceptable to use public transportation.   Please call the [facility] at [phone] if you have any questions about these instructions.  Visitation Policy:  Patients undergoing a surgery or procedure may have one family member or support person with them as long as that person is not COVID-19 positive or experiencing its symptoms.  That person may remain in the waiting area during the procedure.  Inpatient Visitation:    Visiting hours are 7 a.m. to 8 p.m. Patients will be allowed one visitor. The visitor may change daily. The visitor must pass COVID-19 screenings, use hand sanitizer when entering and exiting the patient's room and wear a mask at all times, including in the patient's room. Patients must also wear a mask when staff or their visitor are in the room. Masking is required regardless of vaccination status. Systemwide, no visitors 17 or younger.

## 2020-05-01 ENCOUNTER — Encounter
Admission: RE | Admit: 2020-05-01 | Discharge: 2020-05-01 | Disposition: A | Discharge status: Home / Self Care | Payer: Medicare HMO | Source: Ambulatory Visit | Attending: Orthopedic Surgery | Admitting: Orthopedic Surgery

## 2020-05-01 DIAGNOSIS — Z01812 Encounter for preprocedural laboratory examination: Secondary | ICD-10-CM | POA: Diagnosis present

## 2020-05-01 DIAGNOSIS — Z01810 Encounter for preprocedural cardiovascular examination: Secondary | ICD-10-CM | POA: Diagnosis not present

## 2020-05-02 ENCOUNTER — Ambulatory Visit: Payer: Medicare HMO

## 2020-05-02 ENCOUNTER — Ambulatory Visit: Payer: Medicare HMO | Admitting: Anesthesiology

## 2020-05-02 ENCOUNTER — Other Ambulatory Visit: Payer: Self-pay

## 2020-05-02 ENCOUNTER — Encounter
Admission: RE | Disposition: A | Discharge status: Home / Self Care | Payer: Self-pay | Source: Home / Self Care | Attending: Orthopedic Surgery

## 2020-05-02 ENCOUNTER — Encounter: Payer: Self-pay | Admitting: Orthopedic Surgery

## 2020-05-02 ENCOUNTER — Ambulatory Visit
Admission: RE | Admit: 2020-05-02 | Discharge: 2020-05-02 | Disposition: A | Discharge status: Home / Self Care | Payer: Medicare HMO | Attending: Orthopedic Surgery | Admitting: Orthopedic Surgery

## 2020-05-02 DIAGNOSIS — Z780 Asymptomatic menopausal state: Secondary | ICD-10-CM | POA: Diagnosis not present

## 2020-05-02 DIAGNOSIS — X58XXXA Exposure to other specified factors, initial encounter: Secondary | ICD-10-CM | POA: Insufficient documentation

## 2020-05-02 DIAGNOSIS — S83242A Other tear of medial meniscus, current injury, left knee, initial encounter: Secondary | ICD-10-CM | POA: Diagnosis not present

## 2020-05-02 DIAGNOSIS — M84462A Pathological fracture, left tibia, initial encounter for fracture: Secondary | ICD-10-CM | POA: Diagnosis not present

## 2020-05-02 DIAGNOSIS — S83232A Complex tear of medial meniscus, current injury, left knee, initial encounter: Secondary | ICD-10-CM | POA: Diagnosis not present

## 2020-05-02 DIAGNOSIS — J45909 Unspecified asthma, uncomplicated: Secondary | ICD-10-CM | POA: Diagnosis not present

## 2020-05-02 DIAGNOSIS — M1712 Unilateral primary osteoarthritis, left knee: Secondary | ICD-10-CM | POA: Diagnosis not present

## 2020-05-02 DIAGNOSIS — Z881 Allergy status to other antibiotic agents status: Secondary | ICD-10-CM | POA: Diagnosis not present

## 2020-05-02 DIAGNOSIS — Z885 Allergy status to narcotic agent status: Secondary | ICD-10-CM | POA: Insufficient documentation

## 2020-05-02 DIAGNOSIS — Z886 Allergy status to analgesic agent status: Secondary | ICD-10-CM | POA: Insufficient documentation

## 2020-05-02 DIAGNOSIS — G8918 Other acute postprocedural pain: Secondary | ICD-10-CM | POA: Diagnosis not present

## 2020-05-02 DIAGNOSIS — M25562 Pain in left knee: Secondary | ICD-10-CM | POA: Diagnosis not present

## 2020-05-02 DIAGNOSIS — E785 Hyperlipidemia, unspecified: Secondary | ICD-10-CM | POA: Diagnosis not present

## 2020-05-02 DIAGNOSIS — Z79899 Other long term (current) drug therapy: Secondary | ICD-10-CM | POA: Insufficient documentation

## 2020-05-02 DIAGNOSIS — M8580 Other specified disorders of bone density and structure, unspecified site: Secondary | ICD-10-CM | POA: Diagnosis not present

## 2020-05-02 DIAGNOSIS — M2242 Chondromalacia patellae, left knee: Secondary | ICD-10-CM | POA: Diagnosis not present

## 2020-05-02 DIAGNOSIS — Z419 Encounter for procedure for purposes other than remedying health state, unspecified: Secondary | ICD-10-CM

## 2020-05-02 DIAGNOSIS — M84362A Stress fracture, left tibia, initial encounter for fracture: Secondary | ICD-10-CM | POA: Diagnosis not present

## 2020-05-02 DIAGNOSIS — M25462 Effusion, left knee: Secondary | ICD-10-CM | POA: Insufficient documentation

## 2020-05-02 DIAGNOSIS — M94262 Chondromalacia, left knee: Secondary | ICD-10-CM | POA: Diagnosis not present

## 2020-05-02 DIAGNOSIS — S82142A Displaced bicondylar fracture of left tibia, initial encounter for closed fracture: Secondary | ICD-10-CM | POA: Diagnosis not present

## 2020-05-02 HISTORY — PX: KNEE ARTHROSCOPY WITH MENISCAL REPAIR: SHX5653

## 2020-05-02 SURGERY — ARTHROSCOPY, KNEE, WITH MENISCUS REPAIR
Anesthesia: General | Site: Knee | Laterality: Left

## 2020-05-02 MED ORDER — ONDANSETRON HCL 4 MG/2ML IJ SOLN
INTRAMUSCULAR | Status: DC | PRN
  Administered 2020-05-02: 4 mg via INTRAVENOUS

## 2020-05-02 MED ORDER — FAMOTIDINE 20 MG PO TABS
ORAL_TABLET | ORAL | Status: AC
  Administered 2020-05-02: 20 mg via ORAL
  Filled 2020-05-02: qty 1

## 2020-05-02 MED ORDER — FENTANYL CITRATE (PF) 100 MCG/2ML IJ SOLN
INTRAMUSCULAR | Status: AC
  Administered 2020-05-02: 25 ug via INTRAVENOUS
  Filled 2020-05-02: qty 2

## 2020-05-02 MED ORDER — CEFAZOLIN SODIUM-DEXTROSE 2-4 GM/100ML-% IV SOLN
INTRAVENOUS | Status: AC
  Filled 2020-05-02: qty 100

## 2020-05-02 MED ORDER — FENTANYL CITRATE (PF) 100 MCG/2ML IJ SOLN
25.0000 ug | INTRAMUSCULAR | Status: AC | PRN
Start: 2020-05-02 — End: 2020-05-02
  Administered 2020-05-02 (×2): 25 ug via INTRAVENOUS

## 2020-05-02 MED ORDER — PHENYLEPHRINE HCL (PRESSORS) 10 MG/ML IV SOLN
INTRAVENOUS | Status: DC | PRN
  Administered 2020-05-02: 100 ug via INTRAVENOUS

## 2020-05-02 MED ORDER — BUPIVACAINE HCL (PF) 0.5 % IJ SOLN
INTRAMUSCULAR | Status: AC
  Filled 2020-05-02: qty 20

## 2020-05-02 MED ORDER — ACETAMINOPHEN 10 MG/ML IV SOLN
INTRAVENOUS | Status: DC | PRN
  Administered 2020-05-02: 1000 mg via INTRAVENOUS

## 2020-05-02 MED ORDER — CHLORHEXIDINE GLUCONATE 0.12 % MT SOLN
OROMUCOSAL | Status: AC
  Administered 2020-05-02: 15 mL via OROMUCOSAL
  Filled 2020-05-02: qty 15

## 2020-05-02 MED ORDER — PROPOFOL 10 MG/ML IV BOLUS
INTRAVENOUS | Status: AC
  Filled 2020-05-02: qty 20

## 2020-05-02 MED ORDER — OXYCODONE HCL 5 MG PO TABS
ORAL_TABLET | ORAL | Status: AC
  Filled 2020-05-02: qty 1

## 2020-05-02 MED ORDER — LIDOCAINE-EPINEPHRINE 1 %-1:100000 IJ SOLN
INTRAMUSCULAR | Status: AC
  Filled 2020-05-02: qty 2

## 2020-05-02 MED ORDER — EPHEDRINE SULFATE 50 MG/ML IJ SOLN
INTRAMUSCULAR | Status: DC | PRN
  Administered 2020-05-02 (×2): 7.5 mg via INTRAVENOUS

## 2020-05-02 MED ORDER — ACETAMINOPHEN 10 MG/ML IV SOLN
INTRAVENOUS | Status: AC
  Filled 2020-05-02: qty 100

## 2020-05-02 MED ORDER — LIDOCAINE HCL (PF) 2 % IJ SOLN
INTRAMUSCULAR | Status: AC
  Filled 2020-05-02: qty 5

## 2020-05-02 MED ORDER — FENTANYL CITRATE (PF) 100 MCG/2ML IJ SOLN
INTRAMUSCULAR | Status: AC
  Filled 2020-05-02: qty 2

## 2020-05-02 MED ORDER — OXYCODONE HCL 5 MG PO TABS: 5.0000 mg | ORAL_TABLET | Freq: Once | ORAL | Status: DC

## 2020-05-02 MED ORDER — HYDROCODONE-ACETAMINOPHEN 5-325 MG PO TABS: 1.0000 | ORAL_TABLET | ORAL | 0 refills | Status: DC | PRN

## 2020-05-02 MED ORDER — LIDOCAINE-EPINEPHRINE (PF) 1 %-1:200000 IJ SOLN
INTRAMUSCULAR | Status: DC | PRN
  Administered 2020-05-02: 6 mL via INTRAMUSCULAR

## 2020-05-02 MED ORDER — ACETAMINOPHEN 500 MG PO TABS: 1000.0000 mg | ORAL_TABLET | Freq: Three times a day (TID) | ORAL | 2 refills | Status: DC

## 2020-05-02 MED ORDER — FAMOTIDINE 20 MG PO TABS: 20.0000 mg | ORAL_TABLET | Freq: Once | ORAL | Status: AC

## 2020-05-02 MED ORDER — BUPIVACAINE HCL (PF) 0.5 % IJ SOLN
INTRAMUSCULAR | Status: AC
  Filled 2020-05-02: qty 30

## 2020-05-02 MED ORDER — FENTANYL CITRATE (PF) 100 MCG/2ML IJ SOLN
25.0000 ug | INTRAMUSCULAR | Status: AC | PRN
  Administered 2020-05-02 (×4): 25 ug via INTRAVENOUS

## 2020-05-02 MED ORDER — ASPIRIN EC 325 MG PO TBEC: 325.0000 mg | DELAYED_RELEASE_TABLET | Freq: Every day | ORAL | 0 refills | Status: AC

## 2020-05-02 MED ORDER — ONDANSETRON HCL 4 MG/2ML IJ SOLN
INTRAMUSCULAR | Status: AC
  Filled 2020-05-02: qty 2

## 2020-05-02 MED ORDER — ORAL CARE MOUTH RINSE: 15.0000 mL | Freq: Once | OROMUCOSAL | Status: AC

## 2020-05-02 MED ORDER — SODIUM CHLORIDE (PF) 0.9 % IJ SOLN
INTRAMUSCULAR | Status: AC
  Filled 2020-05-02: qty 20

## 2020-05-02 MED ORDER — EPHEDRINE 5 MG/ML INJ
INTRAVENOUS | Status: AC
  Filled 2020-05-02: qty 10

## 2020-05-02 MED ORDER — CHLORHEXIDINE GLUCONATE 0.12 % MT SOLN: 15.0000 mL | Freq: Once | OROMUCOSAL | Status: AC

## 2020-05-02 MED ORDER — DEXMEDETOMIDINE (PRECEDEX) IN NS 20 MCG/5ML (4 MCG/ML) IV SYRINGE
PREFILLED_SYRINGE | INTRAVENOUS | Status: DC | PRN
  Administered 2020-05-02: 4 ug via INTRAVENOUS

## 2020-05-02 MED ORDER — ONDANSETRON 4 MG PO TBDP: 4.0000 mg | ORAL_TABLET | Freq: Three times a day (TID) | ORAL | 0 refills | Status: DC | PRN

## 2020-05-02 MED ORDER — ONDANSETRON HCL 4 MG/2ML IJ SOLN
4.0000 mg | Freq: Once | INTRAMUSCULAR | Status: AC
  Administered 2020-05-02: 4 mg via INTRAVENOUS

## 2020-05-02 MED ORDER — DEXAMETHASONE SODIUM PHOSPHATE 10 MG/ML IJ SOLN
INTRAMUSCULAR | Status: DC | PRN
  Administered 2020-05-02: 5 mg via INTRAVENOUS

## 2020-05-02 MED ORDER — PROPOFOL 10 MG/ML IV BOLUS
INTRAVENOUS | Status: DC | PRN
  Administered 2020-05-02: 150 mg via INTRAVENOUS

## 2020-05-02 MED ORDER — DEXAMETHASONE SODIUM PHOSPHATE 10 MG/ML IJ SOLN
INTRAMUSCULAR | Status: AC
  Filled 2020-05-02: qty 1

## 2020-05-02 MED ORDER — CEFAZOLIN SODIUM-DEXTROSE 2-4 GM/100ML-% IV SOLN
2.0000 g | INTRAVENOUS | Status: AC
  Administered 2020-05-02: 2 g via INTRAVENOUS

## 2020-05-02 MED ORDER — LACTATED RINGERS IV SOLN: INTRAVENOUS | Status: DC

## 2020-05-02 MED ORDER — BUPIVACAINE HCL (PF) 0.25 % IJ SOLN
INTRAMUSCULAR | Status: DC | PRN
  Administered 2020-05-02: 25 mL

## 2020-05-02 MED ORDER — FENTANYL CITRATE (PF) 100 MCG/2ML IJ SOLN
INTRAMUSCULAR | Status: DC | PRN
  Administered 2020-05-02 (×2): 50 ug via INTRAVENOUS

## 2020-05-02 MED ORDER — LIDOCAINE HCL (CARDIAC) PF 100 MG/5ML IV SOSY
PREFILLED_SYRINGE | INTRAVENOUS | Status: DC | PRN
  Administered 2020-05-02: 80 mg via INTRAVENOUS

## 2020-05-02 SURGICAL SUPPLY — 61 items
ADAPTER IRRIG TUBE 2 SPIKE SOL (ADAPTER) ×2 IMPLANT
ADH SKN CLS APL DERMABOND .7 (GAUZE/BANDAGES/DRESSINGS) ×1
ADPR TBG 2 SPK PMP STRL ASCP (ADAPTER) ×1
APL PRP STRL LF DISP 70% ISPRP (MISCELLANEOUS) ×1
BLADE SURG SZ11 CARB STEEL (BLADE) ×2 IMPLANT
BNDG ESMARK 6X12 TAN STRL LF (GAUZE/BANDAGES/DRESSINGS) ×2 IMPLANT
BRUSH SCRUB EZ  4% CHG (MISCELLANEOUS) ×2
BRUSH SCRUB EZ 4% CHG (MISCELLANEOUS) ×1 IMPLANT
BUR RADIUS 3.5 (BURR) ×2 IMPLANT
BUR RADIUS 4.0X18.5 (BURR) ×2 IMPLANT
CHLORAPREP W/TINT 26 (MISCELLANEOUS) ×2 IMPLANT
COOLER POLAR GLACIER W/PUMP (MISCELLANEOUS) ×2 IMPLANT
COVER WAND RF STERILE (DRAPES) ×2 IMPLANT
CUFF TOURN SGL QUICK 30 (TOURNIQUET CUFF) ×2
CUFF TRNQT CYL 30X4X21-28X (TOURNIQUET CUFF) ×1 IMPLANT
DERMABOND ADVANCED (GAUZE/BANDAGES/DRESSINGS) ×1
DERMABOND ADVANCED .7 DNX12 (GAUZE/BANDAGES/DRESSINGS) ×1 IMPLANT
DRAPE ARTHRO LIMB 89X125 STRL (DRAPES) ×2 IMPLANT
DRAPE C-ARMOR (DRAPES) ×2 IMPLANT
DRAPE IMP U-DRAPE 54X76 (DRAPES) ×2 IMPLANT
ELECT REM PT RETURN 9FT ADLT (ELECTROSURGICAL) ×2
ELECTRODE REM PT RTRN 9FT ADLT (ELECTROSURGICAL) ×1 IMPLANT
GAUZE SPONGE 4X4 12PLY STRL (GAUZE/BANDAGES/DRESSINGS) ×2 IMPLANT
GLOVE SRG 8 PF TXTR STRL LF DI (GLOVE) ×1 IMPLANT
GLOVE SURG ORTHO LTX SZ8 (GLOVE) ×2 IMPLANT
GLOVE SURG SYN 7.5  E (GLOVE) ×2
GLOVE SURG SYN 7.5 E (GLOVE) ×1 IMPLANT
GLOVE SURG UNDER POLY LF SZ8 (GLOVE) ×2
GOWN STRL REUS W/ TWL LRG LVL3 (GOWN DISPOSABLE) ×1 IMPLANT
GOWN STRL REUS W/ TWL XL LVL3 (GOWN DISPOSABLE) ×1 IMPLANT
GOWN STRL REUS W/TWL LRG LVL3 (GOWN DISPOSABLE) ×2
GOWN STRL REUS W/TWL XL LVL3 (GOWN DISPOSABLE) ×2
IMP SYS 2ND FIX PEEK 4.75X19.1 (Miscellaneous) ×2 IMPLANT
IMPL SYS 2ND FX PEEK 4.75X19.1 (Miscellaneous) ×1 IMPLANT
IMPL SYS MENISCAL ROOT REPAIR (Orthopedic Implant) ×2 IMPLANT
IV LACTATED RINGER IRRG 3000ML (IV SOLUTION) ×16
IV LR IRRIG 3000ML ARTHROMATIC (IV SOLUTION) ×8 IMPLANT
KIT ACCUFILL 5CC (Knees) ×1 IMPLANT
KIT KNEE SCP 414.502 (Knees) ×2 IMPLANT
KIT TURNOVER KIT A (KITS) ×2 IMPLANT
MANIFOLD NEPTUNE II (INSTRUMENTS) ×2 IMPLANT
MAT ABSORB  FLUID 56X50 GRAY (MISCELLANEOUS) ×2
MAT ABSORB FLUID 56X50 GRAY (MISCELLANEOUS) ×1 IMPLANT
NEEDLE HYPO 22GX1.5 SAFETY (NEEDLE) ×2 IMPLANT
PACK ARTHROSCOPY KNEE (MISCELLANEOUS) ×2 IMPLANT
PAD ABD DERMACEA PRESS 5X9 (GAUZE/BANDAGES/DRESSINGS) ×4 IMPLANT
PAD WRAPON POLAR KNEE (MISCELLANEOUS) ×1 IMPLANT
PADDING CAST 6X4YD NS (MISCELLANEOUS) ×1
PADDING CAST COTTON 6X4 NS (MISCELLANEOUS) ×1 IMPLANT
SET TUBE SUCT SHAVER OUTFL 24K (TUBING) ×4 IMPLANT
SET TUBE TIP INTRA-ARTICULAR (MISCELLANEOUS) ×2 IMPLANT
SUT ETHILON 3-0 FS-10 30 BLK (SUTURE) ×2
SUT MNCRL 4-0 (SUTURE) ×2
SUT MNCRL 4-0 27XMFL (SUTURE) ×1
SUT VIC AB 2-0 CT1 (SUTURE) ×2 IMPLANT
SUTURE EHLN 3-0 FS-10 30 BLK (SUTURE) ×1 IMPLANT
SUTURE MNCRL 4-0 27XMF (SUTURE) ×1 IMPLANT
TAPE TRANSPORE STRL 2 31045 (GAUZE/BANDAGES/DRESSINGS) ×2 IMPLANT
TUBING ARTHRO INFLOW-ONLY STRL (TUBING) ×2 IMPLANT
WAND WEREWOLF FLOW 90D (MISCELLANEOUS) ×2 IMPLANT
WRAPON POLAR PAD KNEE (MISCELLANEOUS) ×2

## 2020-05-02 NOTE — Anesthesia Preprocedure Evaluation (Addendum)
Anesthesia Evaluation  Patient identified by MRN, date of birth, ID band Patient awake    Reviewed: Allergy & Precautions, H&P , NPO status , Patient's Chart, lab work & pertinent test results  History of Anesthesia Complications Negative for: history of anesthetic complications  Airway Mallampati: III  TM Distance: >3 FB Neck ROM: full    Dental  (+) Chipped   Pulmonary neg shortness of breath, asthma ,    Pulmonary exam normal        Cardiovascular Exercise Tolerance: Good (-) angina(-) Past MI and (-) DOE negative cardio ROS Normal cardiovascular exam     Neuro/Psych negative neurological ROS  negative psych ROS   GI/Hepatic negative GI ROS, Neg liver ROS, neg GERD  ,  Endo/Other  Hypothyroidism   Renal/GU      Musculoskeletal   Abdominal   Peds  Hematology negative hematology ROS (+)   Anesthesia Other Findings Past Medical History: No date: Asthma No date: Heel spur No date: HLD (hyperlipidemia) No date: Hypothyroid No date: Osteopenia     Comment:  stress fracture in foot No date: Plantar fasciitis No date: Stress fracture of foot  Past Surgical History: No date: COLONOSCOPY No date: WISDOM TOOTH EXTRACTION  BMI    Body Mass Index: 25.53 kg/m      Reproductive/Obstetrics negative OB ROS                             Anesthesia Physical Anesthesia Plan  ASA: III  Anesthesia Plan: General LMA   Post-op Pain Management: GA combined w/ Regional for post-op pain   Induction: Intravenous  PONV Risk Score and Plan: Dexamethasone, Ondansetron, Midazolam and Treatment may vary due to age or medical condition  Airway Management Planned: LMA  Additional Equipment:   Intra-op Plan:   Post-operative Plan: Extubation in OR  Informed Consent: I have reviewed the patients History and Physical, chart, labs and discussed the procedure including the risks, benefits and  alternatives for the proposed anesthesia with the patient or authorized representative who has indicated his/her understanding and acceptance.     Dental Advisory Given  Plan Discussed with: Anesthesiologist, CRNA and Surgeon  Anesthesia Plan Comments: (Consented for post op PNB PRN  Patient consented for risks of anesthesia including but not limited to:  - adverse reactions to medications - damage to eyes, teeth, lips or other oral mucosa - nerve damage due to positioning  - sore throat or hoarseness - Damage to heart, brain, nerves, lungs, other parts of body or loss of life  Patient voiced understanding.)       Anesthesia Quick Evaluation

## 2020-05-02 NOTE — Transfer of Care (Signed)
Immediate Anesthesia Transfer of Care Note  Patient: Katrina [name]  Procedure(s) Performed: Left medial meniscus root repair and subchondroplasty of medial plateau with possible chondroplasty of the patella - Reche Dixon to Assist (Left Knee)  Patient Location: PACU  Anesthesia Type:General  Level of Consciousness: awake, drowsy and patient cooperative  Airway & Oxygen Therapy: Patient Spontanous Breathing and Patient connected to face mask oxygen  Post-op Assessment: Report given to RN and Post -op Vital signs reviewed and stable  Post vital signs: Reviewed and stable  Last Vitals:  Vitals Value Taken Time  BP 106/79 05/02/20 1503  Temp    Pulse 74 05/02/20 1509  Resp 14 05/02/20 1509  SpO2 100 % 05/02/20 1509  Vitals shown include unvalidated device data.  Last Pain:  Vitals:   05/02/20 1113  TempSrc: Oral  PainSc: 0-No pain         Complications: No complications documented.

## 2020-05-02 NOTE — Discharge Instructions (Addendum)
Arthroscopic Knee Surgery - Meniscus Repair   Post-Op Instructions   1. Bracing or crutches: Crutches will be provided at the time of discharge from the surgery center. Keep brace locked in extension at all times except as directed by physical therapy.    2. Ice: You may be provided with a device [facility]) that allows you to ice the affected area effectively. Otherwise you can ice manually.    3. Driving:  Plan on not driving for at least four weeks. Please note that you are advised NOT to drive while taking narcotic pain medications as you may be impaired and unsafe to drive.   4. Activity: Ankle pumps several times an hour while awake to prevent blood clots. Weight bearing: NO WEIGHT BEARING FOR 4 WEEKS. Use crutches for at least 4 weeks, if not 6 based on your surgery. Bending and straightening the knee is unlimited, but do not flex your knee past 90 degrees until cleared by your therapist. Elevate knee above heart level as much as possible for one week. Avoid standing more than 5 minutes (consecutively) for the first week. No exercise involving the knee until cleared by the surgeon or physical therapist.  Avoid long distance travel for 4 weeks.   5. Medications:  - You have been provided a prescription for narcotic pain medicine. After surgery, take 1-2 narcotic tablets every 4 hours if needed for severe pain. If it has tylenol (acetaminophen), please do not take a total of more than 3000mg /day of tylenol.  - A prescription for anti-nausea medication will be provided in case the narcotic medicine causes nausea - take 1 tablet every 6 hours only if nauseated.  - Take ibuprofen 800 mg every 8 hours with food OR naproxen (Aleve) 440mg  (2 over the counter tablets) twice daily to reduce post-operative knee swelling - Take enteric coated aspirin 325 mg once daily for 4 weeks to prevent blood clots.  -Take tylenol 1000 every 8 hours for pain.  May stop tylenol 3 days after surgery or when you are  having minimal pain. If your narcotic has tylenol (acetaminophen), please do not take a total of more than 3000mg /day of tylenol.    If you are taking prescription medication for anxiety, depression, insomnia, muscle spasm, chronic pain, or for attention deficit disorder you are advised that you are at a higher risk of adverse effects with use of narcotics post-op, including narcotic addiction/dependence, depressed breathing, death. If you use non-prescribed substances: alcohol, marijuana, cocaine, heroin, methamphetamines, etc., you are at a higher risk of adverse effects with use of narcotics post-op, including narcotic addiction/dependence, depressed breathing, death. You are advised that taking > 50 morphine milligram equivalents (MME) of narcotic pain medication per day results in twice the risk of overdose or death. For your prescription provided: oxycodone 5 mg - taking more than 6 tablets per day. Be advised that we will prescribe narcotics short-term, for acute post-operative pain only - 1 week for minor operations such as knee arthroscopy for meniscus tear resection, and 3 weeks for major operations such as knee repair/reconstruction surgeries.   6. Bandages: The physical therapist should change the bandages at the first post-op appointment. If needed, the dressing supplies have been provided to you. You may shower after this with waterproof bandaids covering the incisions.    7. Physical Therapy: 2 times per week for the first 4 weeks, then 1-2 times per week from weeks 4-8 post-op. Therapy typically starts on post operative Day 3 or 4. You  have been provided an order for physical therapy. The therapist will provide home exercises.   8. Work: May return to full work when off of crutches. May do light duty/desk job in approximately 1-2 weeks when off of narcotics, pain is well-controlled, and swelling has decreased.   9. Post-Op Appointments: Your first post-op appointment will be with Dr.  Posey Pronto in approximately 2 weeks time.    If you find that they have not been scheduled please call the Orthopaedic Appointment front desk at [phone].   AMBULATORY SURGERY  DISCHARGE INSTRUCTIONS   1) The drugs that you were given will stay in your system until tomorrow so for the next 24 hours you should not:  A) Drive an automobile B) Make any legal decisions C) Drink any alcoholic beverage   2) You may resume regular meals tomorrow.  Today it is better to start with liquids and gradually work up to solid foods.  You may eat anything you prefer, but it is better to start with liquids, then soup and crackers, and gradually work up to solid foods.   3) Please notify your doctor immediately if you have any unusual bleeding, trouble breathing, redness and pain at the surgery site, drainage, fever, or pain not relieved by medication.  4) Your post-operative visit with Dr.                                     is: Date:                        Time:    Please call to schedule your post-operative visit.  5) Additional Instructions:

## 2020-05-02 NOTE — H&P (Signed)
Paper H&P to be scanned into permanent record. H&P reviewed. No significant changes noted.  

## 2020-05-02 NOTE — Anesthesia Procedure Notes (Signed)
Anesthesia Regional Block: Adductor canal block   Pre-Anesthetic Checklist: ,, timeout performed, Correct Patient, Correct Site, Correct Laterality, Correct Procedure, Correct Position, site marked, Risks and benefits discussed,  Surgical consent,  Pre-op evaluation,  At surgeon's request and post-op pain management  Laterality: Lower and Left  Prep: chloraprep       Needles:  Injection technique: Single-shot  Needle Type: Echogenic Needle     Needle Length: 9cm  Needle Gauge: 21     Additional Needles:   Procedures:,,,, ultrasound used (permanent image in chart),,,,  Narrative:  Start time: 05/02/2020 3:32 PM End time: 05/02/2020 3:37 PM Injection made incrementally with aspirations every 5 mL.  Performed by: Personally  Anesthesiologist: Arita Miss, MD  Additional Notes: Patient was consented preoperatively by Dr Amie Critchley for risk and benefits of nerve block including but not limited to nerve damage, failed block, bleeding and infection.    Functioning IV was confirmed and monitors were applied.  A echogenic needle was used. Sterile prep,hand hygiene and sterile gloves were used. No sedation used.   No paresthesia endorsed by patient during the procedure.  Negative aspiration and negative test dose prior to incremental administration of local anesthetic. The patient tolerated the procedure well with no immediate complications.

## 2020-05-02 NOTE — Op Note (Addendum)
DATE: 05/02/2020   PRE-OP DIAGNOSIS:  1. Left medial meniscus root tear 2. Left Medial compartment and patellofemoral compartment degenerative changes 3. Left medial tibial plateau subchondral insufficiency fracture   POST-OP DIAGNOSIS:  1. Left medial meniscus root tear 2. Left Medial compartment and patellofemoral compartment degenerative changes 3. Left medial tibial plateau subchondral insufficiency fracture  PROCEDURES:  1. Left knee medial meniscus root repair  2. Left medial femoral condyle and patellofemoral chondroplasty  3. Left knee arthroscopically assisted subchondroplasty of medial tibial plateau (treatment of medial tibial plateau fracture)   SURGEON:  Langston Reusing, MD   ANESTHESIA: Gen + regional   TOTAL IV FLUIDS: See anesthesia record   ESTIMATED BLOOD LOSS: 10cc   DRAINS:  None.   SPECIMENS: None   IMPLANTS:  - Arthrex Biocomposite SwiveLock (x1)      COMPLICATIONS: None   INDICATIONS: Katrina [name] is a 70 y.o. female with L knee pain that began approximately 2 months ago that began after she played golf.  Clinical exam and radiographic studies were notable for left knee pain and swelling with instances of giving way with associated popping and catching on the medial aspect of her knee.  Pain is significantly limiting her daily activities and she is not able to perform any of her desired recreational activities. MRI showed a medial meniscus root tear with minimal degenerative changes to medial compartment and mild degenerative changes to the patellofemoral compartment.  Additionally, MRI showed a subchondral insufficiency fracture with significant bone marrow edema about the medial tibial plateau.  Given the poor long-term prognosis of a meniscus root tear and high likelihood of significant progression of osteoarthritis along with the patient's desired activity level, we elected to proceed with the above procedure after a discussion of the risks,  benefits, and alternatives to surgery.    OPERATIVE FINDINGS:    Examination under anesthesia: A careful examination under anesthesia was performed.  Passive range of motion was: Hyperextension: 1.  Extension: 0.  Flexion: 130.  Lachman: normal. Pivot Shift: normal.  Posterior drawer: normal.  Varus stability in full extension: normal.  Varus stability in 30 degrees of flexion: normal.  Valgus stability in full extension: normal.  Valgus stability in 30 degrees of flexion: normal.   Intra-operative findings: A thorough arthroscopic examination of the knee was performed.  The findings are: 1. Suprapatellar pouch: Normal 2. Undersurface of median ridge: Grade 1 degenerative changes 3. Medial patellar facet: Grade 1 degenerative changes 4. Lateral patellar facet: Grade three degenerative changes 5. Trochlea: Grade 1 degenerative changes over central aspect of trochlear groove 6. Lateral gutter/popliteus tendon: Normal 7. Hoffa's fat pad: Inflamed 8. Medial gutter/plica: Normal 9. ACL: Normal 10. PCL: Normal 11. Medial meniscus: Complete tear of the medial meniscus at the posterior root 12. Medial compartment cartilage: areas of Grade 2-3 degenerative changes on MFC, grade 1-2 changes on tibial plateau  13. Lateral meniscus: Normal 14. Lateral compartment cartilage: Grade 1 changes to tibial plateau, normal femoral condyle  DESCRIPTION OF PROCEDURE: I identified [name] in the pre-operative holding area.  I marked the operative knee with my initials. I reviewed the risks and benefits of the proposed surgical intervention and the patient (and/or patient's guardian) wished to proceed. The patient was transferred to the operative suite and placed in the supine position with all bony prominences padded.  Anesthesia was administered. Appropriate IV antibiotics were administered prior to incision. The extremity was then prepped and draped in standard fashion. A time out  was performed confirming  the correct extremity, correct patient, and correct procedure.   Treatment of medial tibial plateau subchondral insufficiency fracture via subchondroplasty was performed first.  Using fluoroscopic guidance and correlation with the patient's MRI, a small stab incision was made over the medial tibial plateau.  A trocar with cannula was drilled into the site of the subchondral insufficiency fracture such that all of the flutes were within bone.  Calcium phosphate mixture was appropriately mixed and 5cc of calcium phosphate were injected through the cannula into the medial tibial plateau subchondral insufficiency fracture.  Appropriate filling was seen on fluoroscopy. Cannula was left in place for approximately 10 minutes until calcium phosphate had appropriately set. Trocar was removed and local anesthetic was injected about the medial tibial plateau stab incision.  Arthroscopy portals were marked. Local anesthetic was injected to the planned portal sites. The anterolateral portal was established with an 11 blade. The arthroscope was placed in the anterolateral portal and then into the suprapatellar pouch.  A diagnostic knee scope was completed with the above findings.  We confirmed that there was no extravasation of the calcium phosphate material into the joint.   Next, the medial portal was established under needle localization.  A gentle chondroplasty was performed of the lateral patellar facet and the medial femoral condyle using an oscillating shaver such that the cartilage had stable edges.  The MCL was pie-crusted to improve visualization of the posterior horn. The meniscus root tear was identified and probed to confirm our findings. Next, an Arthrex meniscus root aiming guide was used to mark out the tibial incision. An approximately 5cm vertical incision was made medial to the tibial tubercle. This was carried down to the sartorius fascia with bovie electrocautery, and the fascia was incised. An elevator  was used to clear periosteum from the tibia in the area of the anticipated bone tunnel. Hemostasis was achieved. The guide was reinserted into the tibia and was placed over the anatomic footprint of the medial meniscus root. We then used a 6.29mm FlipCutter to drill into the tibia and create a 40mm socket for the meniscus root. A nitinol suture passer was passed through the tibial tunnel and into the joint. This was retrieved and passed through the anterolateral portal.   Next, using a Meniscus Scorpion, an 0-FiberLink suture was passed just medial to the meniscus root in a luggage tag configuration.  A second stitch was passed in a similar fashion medial to the first stitch.  This allowed for excellent purchase of the meniscus root.  We ensured there was no soft tissue bridge between the sutures and pulled the passing stitch from the [place] through the anteromedial portal. We then used the passing stitch to bring the sutures in the meniscus out through the tibial tunnel. These were then passed through an [employer] anchor. Anchor hole was drilled ~3cm distal previously drilled tibial tunnel. Anchor was inserted with appropriate tension while visualizing the repair with the arthroscope, and this achieved excellent interference fit. The meniscus root was probed and found to be stable.   Any loose bony debris was removed from the knee joint with a shaver and excess fluid was evacuated from the joint. Closure of the portals with 3-0 Nylon was performed.  The subdermal layer of the tibial incision was closed with 2-0 vicryl and the skin was closed with 4-0 Monocryl in a running fashion and Dermabond.  Xeroform gauze and dry sterile dressings were applied. A PolarCare and hinged knee brace were  also applied.   Instrument, sponge, and needle counts were correct prior to wound closure and at the conclusion of the case.   Additionally, this case had increased complexity compared to standard meniscus repair  given that the patient had a complete tear of the meniscus root. Repair of this tear involved making a separate open incision, drilling a tunnel through the tibia, and using an implant in the tibia for fixation, all of which would otherwise not occur for standard meniscal repairs.  The steps increased surgical time by approximately 20 minutes.   DISPOSITION: PACU - hemodynamically stable.    POSTOPERATIVE PLAN: The patient will be discharged home today.     Non-weight bearing x 4 weeks. ASA for DVT ppx x 4 weeks, Narcotic medication, NSAIDs, and acetaminophen as discussed pre-operatively. The patient will be attending physical therapy beginning 3-5 days post-op. Physical therapy per Meniscus Root Repair Rehab Guidelines.   Patient to return to clinic 10-14 days postop for suture removal.

## 2020-05-02 NOTE — Anesthesia Procedure Notes (Signed)
Procedure Name: LMA Insertion Date/Time: 05/02/2020 12:52 PM Performed by: Chanetta Marshall, CRNA Pre-anesthesia Checklist: Patient identified, Emergency Drugs available, Suction available and Patient being monitored Patient Re-evaluated:Patient Re-evaluated prior to induction Oxygen Delivery Method: Circle system utilized Preoxygenation: Pre-oxygenation with 100% oxygen Induction Type: IV induction Ventilation: Mask ventilation without difficulty LMA: LMA inserted LMA Size: 4.0 Tube type: Oral Number of attempts: 1 Placement Confirmation: positive ETCO2,  breath sounds checked- equal and bilateral and CO2 detector Tube secured with: Tape Dental Injury: Teeth and Oropharynx as per pre-operative assessment

## 2020-05-03 ENCOUNTER — Encounter: Payer: Self-pay | Admitting: Orthopedic Surgery

## 2020-05-03 NOTE — Anesthesia Postprocedure Evaluation (Signed)
Anesthesia Post Note  Patient: Katrina [name]  Procedure(s) Performed: Left medial meniscus root repair and subchondroplasty of medial plateau with possible chondroplasty of the patella - Reche Dixon to Assist (Left Knee)  Patient location during evaluation: PACU Anesthesia Type: General Level of consciousness: awake and alert Pain management: pain level controlled Vital Signs Assessment: post-procedure vital signs reviewed and stable Respiratory status: spontaneous breathing, nonlabored ventilation, respiratory function stable and patient connected to nasal cannula oxygen Cardiovascular status: blood pressure returned to baseline and stable Postop Assessment: no apparent nausea or vomiting Anesthetic complications: no   No complications documented.   Last Vitals:  Vitals:   05/02/20 1640 05/02/20 1702  BP: 104/68 100/72  Pulse:    Resp: 16 16  Temp: 36.4 C (!) 36.4 C  SpO2: 97% 97%    Last Pain:  Vitals:   05/02/20 1702  TempSrc: Oral  PainSc: 4                  Precious Haws Piscitello

## 2020-05-04 DIAGNOSIS — Z0100 Encounter for examination of eyes and vision without abnormal findings: Secondary | ICD-10-CM | POA: Diagnosis not present

## 2020-05-07 DIAGNOSIS — M25462 Effusion, left knee: Secondary | ICD-10-CM | POA: Diagnosis not present

## 2020-05-07 DIAGNOSIS — M25562 Pain in left knee: Secondary | ICD-10-CM | POA: Diagnosis not present

## 2020-05-10 DIAGNOSIS — M25462 Effusion, left knee: Secondary | ICD-10-CM | POA: Diagnosis not present

## 2020-05-10 DIAGNOSIS — M25562 Pain in left knee: Secondary | ICD-10-CM | POA: Diagnosis not present

## 2020-05-15 DIAGNOSIS — M25462 Effusion, left knee: Secondary | ICD-10-CM | POA: Diagnosis not present

## 2020-05-15 DIAGNOSIS — M25562 Pain in left knee: Secondary | ICD-10-CM | POA: Diagnosis not present

## 2020-05-18 DIAGNOSIS — M25562 Pain in left knee: Secondary | ICD-10-CM | POA: Diagnosis not present

## 2020-05-18 DIAGNOSIS — M25462 Effusion, left knee: Secondary | ICD-10-CM | POA: Diagnosis not present

## 2020-05-22 DIAGNOSIS — M25462 Effusion, left knee: Secondary | ICD-10-CM | POA: Diagnosis not present

## 2020-05-22 DIAGNOSIS — M25562 Pain in left knee: Secondary | ICD-10-CM | POA: Diagnosis not present

## 2020-05-24 DIAGNOSIS — M25462 Effusion, left knee: Secondary | ICD-10-CM | POA: Diagnosis not present

## 2020-05-24 DIAGNOSIS — M25562 Pain in left knee: Secondary | ICD-10-CM | POA: Diagnosis not present

## 2020-05-29 DIAGNOSIS — M25462 Effusion, left knee: Secondary | ICD-10-CM | POA: Diagnosis not present

## 2020-05-29 DIAGNOSIS — M25562 Pain in left knee: Secondary | ICD-10-CM | POA: Diagnosis not present

## 2020-06-01 DIAGNOSIS — M25562 Pain in left knee: Secondary | ICD-10-CM | POA: Diagnosis not present

## 2020-06-01 DIAGNOSIS — M25462 Effusion, left knee: Secondary | ICD-10-CM | POA: Diagnosis not present

## 2020-06-05 DIAGNOSIS — M25462 Effusion, left knee: Secondary | ICD-10-CM | POA: Diagnosis not present

## 2020-06-05 DIAGNOSIS — M25562 Pain in left knee: Secondary | ICD-10-CM | POA: Diagnosis not present

## 2020-06-08 DIAGNOSIS — M25462 Effusion, left knee: Secondary | ICD-10-CM | POA: Diagnosis not present

## 2020-06-08 DIAGNOSIS — G8929 Other chronic pain: Secondary | ICD-10-CM | POA: Diagnosis not present

## 2020-06-08 DIAGNOSIS — R29898 Other symptoms and signs involving the musculoskeletal system: Secondary | ICD-10-CM | POA: Diagnosis not present

## 2020-06-08 DIAGNOSIS — M25662 Stiffness of left knee, not elsewhere classified: Secondary | ICD-10-CM | POA: Diagnosis not present

## 2020-06-08 DIAGNOSIS — M25562 Pain in left knee: Secondary | ICD-10-CM | POA: Diagnosis not present

## 2020-06-12 DIAGNOSIS — M25562 Pain in left knee: Secondary | ICD-10-CM | POA: Diagnosis not present

## 2020-06-12 DIAGNOSIS — M25462 Effusion, left knee: Secondary | ICD-10-CM | POA: Diagnosis not present

## 2020-06-14 DIAGNOSIS — M25562 Pain in left knee: Secondary | ICD-10-CM | POA: Diagnosis not present

## 2020-06-14 DIAGNOSIS — M25462 Effusion, left knee: Secondary | ICD-10-CM | POA: Diagnosis not present

## 2020-06-18 DIAGNOSIS — M25562 Pain in left knee: Secondary | ICD-10-CM | POA: Diagnosis not present

## 2020-06-18 DIAGNOSIS — M25462 Effusion, left knee: Secondary | ICD-10-CM | POA: Diagnosis not present

## 2020-06-21 DIAGNOSIS — M25562 Pain in left knee: Secondary | ICD-10-CM | POA: Diagnosis not present

## 2020-06-21 DIAGNOSIS — M25462 Effusion, left knee: Secondary | ICD-10-CM | POA: Diagnosis not present

## 2020-06-25 DIAGNOSIS — M25462 Effusion, left knee: Secondary | ICD-10-CM | POA: Diagnosis not present

## 2020-06-25 DIAGNOSIS — M25562 Pain in left knee: Secondary | ICD-10-CM | POA: Diagnosis not present

## 2020-06-28 DIAGNOSIS — M25562 Pain in left knee: Secondary | ICD-10-CM | POA: Diagnosis not present

## 2020-06-28 DIAGNOSIS — M25462 Effusion, left knee: Secondary | ICD-10-CM | POA: Diagnosis not present

## 2020-07-02 DIAGNOSIS — M25562 Pain in left knee: Secondary | ICD-10-CM | POA: Diagnosis not present

## 2020-07-02 DIAGNOSIS — M25462 Effusion, left knee: Secondary | ICD-10-CM | POA: Diagnosis not present

## 2020-07-05 DIAGNOSIS — M25562 Pain in left knee: Secondary | ICD-10-CM | POA: Diagnosis not present

## 2020-07-05 DIAGNOSIS — M25462 Effusion, left knee: Secondary | ICD-10-CM | POA: Diagnosis not present

## 2020-07-10 DIAGNOSIS — M25562 Pain in left knee: Secondary | ICD-10-CM | POA: Diagnosis not present

## 2020-07-10 DIAGNOSIS — M25462 Effusion, left knee: Secondary | ICD-10-CM | POA: Diagnosis not present

## 2020-07-11 ENCOUNTER — Other Ambulatory Visit: Payer: Self-pay | Admitting: Otolaryngology

## 2020-07-11 DIAGNOSIS — H90A22 Sensorineural hearing loss, unilateral, left ear, with restricted hearing on the contralateral side: Secondary | ICD-10-CM

## 2020-07-12 DIAGNOSIS — M25462 Effusion, left knee: Secondary | ICD-10-CM | POA: Diagnosis not present

## 2020-07-12 DIAGNOSIS — M25562 Pain in left knee: Secondary | ICD-10-CM | POA: Diagnosis not present

## 2020-07-16 DIAGNOSIS — M25462 Effusion, left knee: Secondary | ICD-10-CM | POA: Diagnosis not present

## 2020-07-16 DIAGNOSIS — M25562 Pain in left knee: Secondary | ICD-10-CM | POA: Diagnosis not present

## 2020-07-19 DIAGNOSIS — M25462 Effusion, left knee: Secondary | ICD-10-CM | POA: Diagnosis not present

## 2020-07-19 DIAGNOSIS — M25562 Pain in left knee: Secondary | ICD-10-CM | POA: Diagnosis not present

## 2020-07-23 ENCOUNTER — Other Ambulatory Visit: Payer: Self-pay

## 2020-07-23 ENCOUNTER — Ambulatory Visit
Admission: RE | Admit: 2020-07-23 | Discharge: 2020-07-23 | Disposition: A | Discharge status: Home / Self Care | Payer: Medicare HMO | Source: Ambulatory Visit | Attending: Otolaryngology | Admitting: Otolaryngology

## 2020-07-23 DIAGNOSIS — H905 Unspecified sensorineural hearing loss: Secondary | ICD-10-CM | POA: Diagnosis not present

## 2020-07-23 DIAGNOSIS — H90A22 Sensorineural hearing loss, unilateral, left ear, with restricted hearing on the contralateral side: Secondary | ICD-10-CM | POA: Insufficient documentation

## 2020-07-23 DIAGNOSIS — H9190 Unspecified hearing loss, unspecified ear: Secondary | ICD-10-CM | POA: Diagnosis not present

## 2020-07-23 MED ORDER — GADOBUTROL 1 MMOL/ML IV SOLN
7.0000 mL | Freq: Once | INTRAVENOUS | Status: AC | PRN
  Administered 2020-07-23: 7 mL via INTRAVENOUS

## 2020-07-31 DIAGNOSIS — M25462 Effusion, left knee: Secondary | ICD-10-CM | POA: Diagnosis not present

## 2020-07-31 DIAGNOSIS — M25562 Pain in left knee: Secondary | ICD-10-CM | POA: Diagnosis not present

## 2020-08-01 DIAGNOSIS — H90A22 Sensorineural hearing loss, unilateral, left ear, with restricted hearing on the contralateral side: Secondary | ICD-10-CM | POA: Diagnosis not present

## 2020-08-01 DIAGNOSIS — H6982 Other specified disorders of Eustachian tube, left ear: Secondary | ICD-10-CM | POA: Diagnosis not present

## 2020-08-02 DIAGNOSIS — M25562 Pain in left knee: Secondary | ICD-10-CM | POA: Diagnosis not present

## 2020-08-02 DIAGNOSIS — M25462 Effusion, left knee: Secondary | ICD-10-CM | POA: Diagnosis not present

## 2020-08-06 DIAGNOSIS — M25562 Pain in left knee: Secondary | ICD-10-CM | POA: Diagnosis not present

## 2020-08-06 DIAGNOSIS — M25462 Effusion, left knee: Secondary | ICD-10-CM | POA: Diagnosis not present

## 2020-08-09 DIAGNOSIS — M25462 Effusion, left knee: Secondary | ICD-10-CM | POA: Diagnosis not present

## 2020-08-09 DIAGNOSIS — M25562 Pain in left knee: Secondary | ICD-10-CM | POA: Diagnosis not present

## 2020-08-13 DIAGNOSIS — M25562 Pain in left knee: Secondary | ICD-10-CM | POA: Diagnosis not present

## 2020-08-13 DIAGNOSIS — M25462 Effusion, left knee: Secondary | ICD-10-CM | POA: Diagnosis not present

## 2020-08-16 DIAGNOSIS — M25462 Effusion, left knee: Secondary | ICD-10-CM | POA: Diagnosis not present

## 2020-08-16 DIAGNOSIS — M25562 Pain in left knee: Secondary | ICD-10-CM | POA: Diagnosis not present

## 2020-08-20 DIAGNOSIS — M25462 Effusion, left knee: Secondary | ICD-10-CM | POA: Diagnosis not present

## 2020-08-20 DIAGNOSIS — M25562 Pain in left knee: Secondary | ICD-10-CM | POA: Diagnosis not present

## 2020-09-30 ENCOUNTER — Telehealth: Payer: Self-pay | Admitting: Internal Medicine

## 2020-09-30 MED ORDER — NIRMATRELVIR/RITONAVIR (PAXLOVID)TABLET: 3.0000 | ORAL_TABLET | Freq: Two times a day (BID) | ORAL | 0 refills | Status: AC

## 2020-09-30 NOTE — Telephone Encounter (Signed)
Katrina [name]-please check in to see how she is doing.  Rich- thanks so much for treating her.

## 2020-09-30 NOTE — Telephone Encounter (Signed)
Phone call from [employer] with [facility].  Husband positive for COVID She started with symptoms yesterday---now mostly just fatigue (no SOB) Did initial rapid test--negative  Discussed that her risks are fairly low so not clear she needs Rx. If she is interested in paxlovid---will need to redo the test and prove it clearly is COVID. Only interaction is with the thyroid med--okay for paxlovid if she really is positive and feels strongly that she wants Rx

## 2020-09-30 NOTE — Telephone Encounter (Signed)
Called back by [facility] RN Patient did do a home test and was positive. Requests antiviral Rx and paxlovid sent for her

## 2020-10-01 NOTE — Telephone Encounter (Signed)
PLEASE NOTE: All timestamps contained within this report are represented as [place] Standard Time. CONFIDENTIALTY NOTICE: This fax transmission is intended only for the addressee. It contains information that is legally privileged, confidential or otherwise protected from use or disclosure. If you are not the intended recipient, you are strictly prohibited from reviewing, disclosing, copying using or disseminating any of this information or taking any action in reliance on or regarding this information. If you have received this fax in error, please notify us immediately by telephone so that we can arrange for its return to [place]. Phone: [phone], Toll-Free: [phone], Fax: [phone] Page: 1 of 3 Call Id: [id number] [place] Night - Client TELEPHONE ADVICE RECORD [facility] Patient Name: Katrina [name] Gender: Female DOB: [birth year] Age: 70 Y 1 M 12 D Return Phone Number: [phone] (Primary) Address: City/ State/ Zip: [place] [address] Client Fernando Salinas Night - Client Client Site [facility], Roque Lias - MD Contact Type Call Who Is Calling Patient / Member / Family / Caregiver Call Type Triage / Clinical Relationship To Patient Self Return Phone Number [phone] (Primary) Chief Complaint Fever (non urgent symptom) (> THREE MONTHS) Reason for Call Symptomatic / Request for [place] states her husband was DX with covid last week. Caller states she has a fever of 101, chills, aches , and cough. Translation No Nurse Assessment Nurse: Hassell Done, RN, Melanie Date/Time (Eastern Time): 09/30/2020 2:51:19 PM Confirm and document reason for call. If symptomatic, describe symptoms. ---Caller states her H was diagnosed with covid last week and she started symptoms on Friday afternoon. Her home test was negative on Thursday. She has fever of 101 with an  oral reading and cough and body aches and extreme headache. Does the patient have any new or worsening symptoms? ---Yes Will a triage be completed? ---Yes Related visit to physician within the last 2 weeks? ---No Does the PT have any chronic conditions? (i.e. diabetes, asthma, this includes High risk factors for pregnancy, etc.) ---Yes List chronic conditions. ---asthma exercise induced Is this a behavioral health or substance abuse call? ---No Guidelines Guideline Title Affirmed Question Affirmed Notes Nurse Date/Time ([place] Time) COVID-19 - Diagnosed or Suspected [1] HIGH RISK for severe COVID complications (e.g., weak immune system, age > 46 years, obesity with BMI > 25, Arnaldo Natal 09/30/2020 2:53:45 PM PLEASE NOTE: All timestamps contained within this report are represented as [place] Standard Time. CONFIDENTIALTY NOTICE: This fax transmission is intended only for the addressee. It contains information that is legally privileged, confidential or otherwise protected from use or disclosure. If you are not the intended recipient, you are strictly prohibited from reviewing, disclosing, copying using or disseminating any of this information or taking any action in reliance on or regarding this information. If you have received this fax in error, please notify us immediately by telephone so that we can arrange for its return to [place]. Phone: [phone], Toll-Free: [phone], Fax: [phone] Page: 2 of 3 Call Id: [id number] Guidelines Guideline Title Affirmed Question Affirmed Notes Nurse Date/Time Eilene Ghazi Time) pregnant, chronic lung disease or other chronic medical condition) AND [2] COVID symptoms (e.g., cough, fever) (Exceptions: Already seen by PCP and no new or worsening symptoms.) Disp. Time Eilene Ghazi Time) Disposition Final User 09/30/2020 2:59:31 PM Paged On Call back to [facility], RN, Threasa Beards 09/30/2020 3:00:30 PM Paged On Call back to [facility], RN, Threasa Beards 09/30/2020 3:18:53 PM Send To  RN Personal Hassell Done, RN, Melanie 09/30/2020 3:41:07 PM Paged On Call back to [facility], RN, Threasa Beards 09/30/2020 4:27:51 PM Paged On Call back to [facility], [place], Threasa Beards 09/30/2020 4:28:21 PM Paged On Call back to [facility], [place], Threasa Beards 09/30/2020 3:00:09 PM Call PCP Now Yes Hassell Done, RN, Donnajean Lopes Disagree/Comply Comply Caller Understands Yes PreDisposition Call Doctor Care Advice Given Per Guideline CALL PCP NOW: * You need to discuss this with your doctor (or NP/PA). GENERAL CARE ADVICE FOR COVID-19 SYMPTOMS: * Fever: For fever over 101 F (38.3 C), take acetaminophen every 4 to 6 hours (Adults 650 mg) OR ibuprofen every 6 to 8 hours (Adults 400 mg). Before taking any medicine, read all the instructions on the package. Do not take aspirin unless your doctor has prescribed it for you. * The symptoms are generally treated the same whether you have COVID-19, influenza or some other respiratory virus. CALL BACK IF: * You become worse CARE ADVICE given per COVID-19 - DIAGNOSED OR SUSPECTED (Adult) guideline. Comments User: Verner Chol, RN Date/Time Eilene Ghazi Time): 09/30/2020 3:18:21 PM Call was made to caller and she was interested in obtaining anti viral so she was going to do a home test and this writer will attempt to call her back in 20 minutes. User: Verner Chol, RN Date/Time (Eastern Time): 09/30/2020 3:40:00 PM Call back was made to pt and she had a positive home test. This will be shared with Dr Silvio Pate. PLEASE NOTE: All timestamps contained within this report are represented as [place] Standard Time. CONFIDENTIALTY NOTICE: This fax transmission is intended only for the addressee. It contains information that is legally privileged, confidential or otherwise protected from use or disclosure. If you are not the intended recipient, you are strictly prohibited from reviewing, disclosing, copying using or  disseminating any of this information or taking any action in reliance on or regarding this information. If you have received this fax in error, please notify us immediately by telephone so that we can arrange for its return to [place]. Phone: [phone], Toll-Free: [phone], Fax: [phone] Page: 3 of 3 Call Id: [id number] Referrals REFERRED TO PCP OFFICE Paging DoctorName Phone DateTime Result/ Outcome Message Type Notes Viviana Simpler- MD [phone] 09/30/2020 2:59:31 PM Paged On Call Back to Call Center Doctor Paged please call Melanie RN at [facility] # [phone] Viviana Simpler- MD 09/30/2020 3:15:18 PM Spoke with On Call - [place] states pt would have to have a home test to confirm she has covid and also it doesnt sound that she is at high risk for complications and Dr wonders if pt actually wants to be on the antiviral medication? These questions will be asked of pt. Viviana Simpler- MD [phone] 09/30/2020 3:41:07 PM Called On Call Provider - Left Message Doctor Paged Viviana Simpler- MD [phone] 09/30/2020 4:27:51 PM Paged On Call Back to Call Center Doctor Paged Hi Dr, Threasa Beards RN with Access nurse. # [phone] Viviana Simpler- MD [phone] 09/30/2020 4:28:21 PM Called On Call Provider - Left Message Doctor Paged Viviana Simpler- MD 09/30/2020 4:41:20 PM Spoke with On Call - General Message Result Spoke with on call and gave the pt covid result and her desire to be prescribed the paxlovid. On call called meds in and asked that pt be told.

## 2020-10-01 NOTE — Telephone Encounter (Signed)
Called to check on pt per PCP request. Pt said she has only had 2 doses of med so she hasn't had much improvement in sxs yet. She really has body aches and overall feeling bad but nothing that is concerning her. No trouble breathing or SOB or any new sxs. Pt will keep taking med and keep [place] posted of she has any questions or concerns pt thanked PCP for checking on her and right now she is sick but stable.

## 2020-10-02 ENCOUNTER — Encounter: Payer: Self-pay | Admitting: Family Medicine

## 2020-10-17 DIAGNOSIS — S83242D Other tear of medial meniscus, current injury, left knee, subsequent encounter: Secondary | ICD-10-CM | POA: Diagnosis not present

## 2020-11-02 ENCOUNTER — Other Ambulatory Visit: Payer: Self-pay

## 2020-11-02 ENCOUNTER — Ambulatory Visit
Admission: RE | Admit: 2020-11-02 | Discharge: 2020-11-02 | Disposition: A | Discharge status: Home / Self Care | Payer: Medicare HMO | Source: Ambulatory Visit | Attending: Family Medicine | Admitting: Family Medicine

## 2020-11-02 DIAGNOSIS — Z1231 Encounter for screening mammogram for malignant neoplasm of breast: Secondary | ICD-10-CM | POA: Insufficient documentation

## 2020-12-30 ENCOUNTER — Telehealth: Payer: Self-pay | Admitting: Family Medicine

## 2020-12-30 DIAGNOSIS — Z0000 Encounter for general adult medical examination without abnormal findings: Secondary | ICD-10-CM

## 2020-12-30 DIAGNOSIS — E039 Hypothyroidism, unspecified: Secondary | ICD-10-CM

## 2020-12-30 DIAGNOSIS — E7800 Pure hypercholesterolemia, unspecified: Secondary | ICD-10-CM

## 2020-12-30 NOTE — Telephone Encounter (Signed)
-----   Message from Ellamae Sia sent at 12/19/2020  9:19 AM EDT ----- Regarding: Lab orders for Monday, 9.26.22 Patient is scheduled for CPX labs, please order future labs, Thanks , Karna Christmas

## 2020-12-31 ENCOUNTER — Other Ambulatory Visit: Payer: Medicare HMO

## 2021-01-03 ENCOUNTER — Ambulatory Visit (INDEPENDENT_AMBULATORY_CARE_PROVIDER_SITE_OTHER): Payer: Medicare HMO | Admitting: Family Medicine

## 2021-01-03 ENCOUNTER — Encounter: Payer: Self-pay | Admitting: Family Medicine

## 2021-01-03 ENCOUNTER — Other Ambulatory Visit: Payer: Self-pay

## 2021-01-03 VITALS — BP 120/80 | HR 58 | Temp 97.5°F | Ht 66.0 in | Wt 164.5 lb

## 2021-01-03 DIAGNOSIS — Z23 Encounter for immunization: Secondary | ICD-10-CM | POA: Diagnosis not present

## 2021-01-03 DIAGNOSIS — E039 Hypothyroidism, unspecified: Secondary | ICD-10-CM

## 2021-01-03 DIAGNOSIS — M8580 Other specified disorders of bone density and structure, unspecified site: Secondary | ICD-10-CM | POA: Diagnosis not present

## 2021-01-03 DIAGNOSIS — E7800 Pure hypercholesterolemia, unspecified: Secondary | ICD-10-CM | POA: Diagnosis not present

## 2021-01-03 DIAGNOSIS — E2839 Other primary ovarian failure: Secondary | ICD-10-CM | POA: Diagnosis not present

## 2021-01-03 DIAGNOSIS — Z0000 Encounter for general adult medical examination without abnormal findings: Secondary | ICD-10-CM | POA: Diagnosis not present

## 2021-01-03 LAB — LIPID PANEL
Cholesterol: 220 mg/dL — ABNORMAL HIGH (ref 0–200)
HDL: 65.9 mg/dL (ref >39.00)
LDL Cholesterol: 126 mg/dL — ABNORMAL HIGH (ref 0–99)
NonHDL: 154.49
Total CHOL/HDL Ratio: 3
Triglycerides: 143 mg/dL (ref 0.0–149.0)
VLDL: 28.6 mg/dL (ref 0.0–40.0)

## 2021-01-03 LAB — COMPREHENSIVE METABOLIC PANEL
ALT: 16 U/L (ref 0–35)
AST: 18 U/L (ref 0–37)
Albumin: 4.4 g/dL (ref 3.5–5.2)
Alkaline Phosphatase: 89 U/L (ref 39–117)
BUN: 13 mg/dL (ref 6–23)
CO2: 29 mEq/L (ref 19–32)
Calcium: 9.7 mg/dL (ref 8.4–10.5)
Chloride: 101 mEq/L (ref 96–112)
Creatinine, Ser: 0.94 mg/dL (ref 0.40–1.20)
GFR: 62.41 mL/min (ref >60.00)
Glucose, Bld: 90 mg/dL (ref 70–99)
Potassium: 4.5 mEq/L (ref 3.5–5.1)
Sodium: 138 mEq/L (ref 135–145)
Total Bilirubin: 1.3 mg/dL — ABNORMAL HIGH (ref 0.2–1.2)
Total Protein: 7.3 g/dL (ref 6.0–8.3)

## 2021-01-03 LAB — CBC WITH DIFFERENTIAL/PLATELET
Basophils Absolute: 0.1 10*3/uL (ref 0.0–0.1)
Basophils Relative: 1.3 % (ref 0.0–3.0)
Eosinophils Absolute: 0.1 10*3/uL (ref 0.0–0.7)
Eosinophils Relative: 1.3 % (ref 0.0–5.0)
HCT: 43.5 % (ref 36.0–46.0)
Hemoglobin: 14.4 g/dL (ref 12.0–15.0)
Lymphocytes Relative: 32.3 % (ref 12.0–46.0)
Lymphs Abs: 1.3 10*3/uL (ref 0.7–4.0)
MCHC: 33.1 g/dL (ref 30.0–36.0)
MCV: 95.2 fl (ref 78.0–100.0)
Monocytes Absolute: 0.4 10*3/uL (ref 0.1–1.0)
Monocytes Relative: 10.6 % (ref 3.0–12.0)
Neutro Abs: 2.3 10*3/uL (ref 1.4–7.7)
Neutrophils Relative %: 54.5 % (ref 43.0–77.0)
Platelets: 268 10*3/uL (ref 150.0–400.0)
RBC: 4.57 Mil/uL (ref 3.87–5.11)
RDW: 13.2 % (ref 11.5–15.5)
WBC: 4.1 10*3/uL (ref 4.0–10.5)

## 2021-01-03 LAB — TSH: TSH: 4.72 u[IU]/mL (ref 0.35–5.50)

## 2021-01-03 NOTE — Patient Instructions (Addendum)
If you are interested in the new shingles vaccine (Shingrix) - call your local pharmacy to check on coverage and availability  If affordable, get on a wait list at your pharmacy to get the vaccine.   Call and schedule your bone density test   Work on your advance directive  Labs today  Flu shot today

## 2021-01-03 NOTE — Progress Notes (Signed)
Subjective:    Patient ID: Katrina [name], female    DOB: 1950-09-02, 70 y.o.   MRN: [id number]  This visit occurred during the SARS-CoV-2 public health emergency.  Safety protocols were in place, including screening questions prior to the visit, additional usage of staff PPE, and extensive cleaning of exam room while observing appropriate contact time as indicated for disinfecting solutions.   HPI Pt presents for amw and health mt exam   I have personally reviewed the Medicare Annual Wellness questionnaire and have noted 1. The patient's medical and social history 2. Their use of alcohol, tobacco or illicit drugs 3. Their current medications and supplements 4. The patient's functional ability including ADL's, fall risks, home safety risks and hearing or visual             impairment. 5. Diet and physical activities 6. Evidence for depression or mood disorders  The patients weight, height, BMI have been recorded in the chart and visual acuity is per eye clinic.  I have made referrals, counseling and provided education to the patient based review of the above and I have provided the pt with a written personalized care plan for preventive services. Reviewed and updated provider list, see scanned forms.  See scanned forms.  Routine anticipatory guidance given to patient.  See health maintenance. Colon cancer screening   colonoscopy 1/19  GF had colon cancer Breast cancer screening  mammogram 7/22 Self breast exam- no lumps  Flu vaccine today Covid status - has had covid in July - did not tolerate anti viral  Tetanus vaccine  Tdap 5/13 Pneumovax up to date  Zoster vaccine-interested if covered  Dexa  2/20 osteopenia  Falls-none Fractures-(her knee injury involved a fracture)  Supplements- vit D  Exercise - getting back to    Advance directive- given materials to work on  Cognitive function addressed- see scanned forms- and if abnormal then additional documentation follows.    Pays attention to memory  Occ misplaces things  Is mindful  Does her finances and her mom's  Reads  Socializes  Active -mind and body    PMH and SH reviewed  Meds, vitals, and allergies reviewed.   ROS: See HPI.  Otherwise negative.    Weight : Wt Readings from Last 3 Encounters:  01/03/21 164 lb 8 oz (74.6 kg)  05/02/20 157 lb (71.2 kg)  12/29/19 160 lb 2 oz (72.6 kg)   26.55 kg/m  Diet is not as good as it should be  Did fast for labs   Very busy  Had knee surgery/meniscal  -more than expected and now just about back to normal (working on flexibility)  Is back playing golf Walking some  Not exercising as much -getting back to it   Hearing/vision: Hearing Screening   500Hz  1000Hz  2000Hz  4000Hz   Right ear 20 20 20 20   Left ear 25 25 20  0  Comments: Aware of decrease in hearing.  Seen at ENT.  Vision Screening   Right eye Left eye Both eyes  Without correction     With correction 20/30 20/50 20/30   Comments: Wears contacts.  Wearing at today's OV.   She had pain in ear and ENT did MRI and it was fine  Eye exam is scheduled soon    PHQ: Depression screen [facility] 2/9 01/03/2021 12/29/2019 11/25/2018 11/23/2017 10/07/2016  Decreased Interest 0 0 0 0 0  Down, Depressed, Hopeless 0 0 0 0 0  PHQ - 2 Score 0 0 0 0  0  Altered sleeping - - - - 0  Tired, decreased energy - - - - 0  Change in appetite - - - - 0  Feeling bad or failure about yourself  - - - - 0  Trouble concentrating - - - - 0  Moving slowly or fidgety/restless - - - - 0  Suicidal thoughts - - - - 0  PHQ-9 Score - - - - 0  Mood is pretty good    ADLs No problems   Functionality: good   Care team : Jhoselin Crume-pcp   BP Readings from Last 3 Encounters:  01/03/21 120/80  05/02/20 100/72  12/29/19 118/80   Pulse Readings from Last 3 Encounters:  01/03/21 (!) 58  05/02/20 77  12/29/19 61    Hypothyroidism  Pt has no clinical changes No change in energy level/ hair or skin/ edema and no  tremor Lab Results  Component Value Date   TSH 2.86 12/29/2019    Due for labs Euthyrox 88 mcg daily  Hyperlipidemia Lab Results  Component Value Date   CHOL 198 02/20/2020   HDL 61.70 02/20/2020   LDLCALC 109 (H) 02/20/2020   LDLDIRECT 184.1 03/28/2013   TRIG 136.0 02/20/2020   CHOLHDL 3 02/20/2020   Due for labs  Takes zetia 10 mg daily Crestor caused muscle pain in the past    Patient Active Problem List   Diagnosis Date Noted   Medicare annual wellness visit, subsequent 11/25/2018   Welcome to Medicare preventive visit 11/23/2017   Encounter for routine gynecological examination 09/28/2015   Estrogen deficiency 09/28/2015   Colon cancer screening 03/28/2013   Routine general medical examination at a health care facility 08/15/2011   Gynecological examination 08/15/2011   Post-menopausal 08/15/2011   Screening mammogram, encounter for 01/09/2011   Osteopenia 01/29/2007   Hypothyroidism 01/12/2007   Hyperlipidemia 01/12/2007   HEEL SPUR 01/12/2007   Past Medical History:  Diagnosis Date   Asthma    Heel spur    HLD (hyperlipidemia)    Hypothyroid    Osteopenia    stress fracture in foot   Plantar fasciitis    Stress fracture of foot    Past Surgical History:  Procedure Laterality Date   COLONOSCOPY     KNEE ARTHROSCOPY WITH MENISCAL REPAIR Left 05/02/2020   Procedure: Left medial meniscus root repair and subchondroplasty of medial plateau with possible chondroplasty of the patella - Reche Dixon to Assist;  Surgeon: Leim Fabry, MD;  Location: [facility] ORS;  Service: Orthopedics;  Laterality: Left;   WISDOM TOOTH EXTRACTION     Social History   Tobacco Use   Smoking status: Never   Smokeless tobacco: Never  Substance Use Topics   Alcohol use: Yes    Alcohol/week: 0.0 standard drinks    Comment: Occasional   Drug use: No   Family History  Problem Relation Age of Onset   Myelodysplastic syndrome Father    Hypertension Mother    Thyroid disease Mother     Cancer Other        GM--gallbladder   Lung cancer Other        GF   Colon cancer Other        GF   Osteoporosis Other        GM   Breast cancer Neg Hx    Allergies  Allergen Reactions   Advil [Ibuprofen]     Not an allergy but "feels awful the next day."   Clindamycin/Lincomycin Other (See Comments)  Caused C-diff   Codeine Nausea And Vomiting   Crestor [Rosuvastatin]     Muscle pain   Current Outpatient Medications on File Prior to Visit  Medication Sig Dispense Refill   albuterol (PROVENTIL HFA;VENTOLIN HFA) 108 (90 Base) MCG/ACT inhaler Inhale 2 puffs into the lungs every 4 (four) hours as needed for wheezing. 1 Inhaler 5   Calcium-Magnesium-Zinc (CAL-MAG-ZINC PO) Take 1 tablet by mouth daily with lunch.     Cholecalciferol (VITAMIN D-3) 125 MCG (5000 UT) TABS Take 5,000 Units by mouth daily with lunch.     EUTHYROX 88 MCG tablet Take 1 tablet by mouth once daily (Patient taking differently: Take 88 mcg by mouth daily before breakfast.) 90 tablet 2   ezetimibe (ZETIA) 10 MG tablet Take 1 tablet by mouth once daily (Patient taking differently: Take 10 mg by mouth at bedtime.) 90 tablet 2   Multiple Vitamin (MULTIVITAMIN WITH MINERALS) TABS tablet Take 1 tablet by mouth daily with lunch.     Oxygen Permeable Lens Products (RENU REWETTING DROPS) SOLN Place 1 drop into both eyes 3 (three) times daily as needed (dry/irritated eyes).     vitamin C (ASCORBIC ACID) 500 MG tablet Take 500 mg by mouth daily with lunch.     No current facility-administered medications on file prior to visit.    Review of Systems  Constitutional:  Negative for activity change, appetite change, fatigue, fever and unexpected weight change.  HENT:  Negative for congestion, ear pain, rhinorrhea, sinus pressure and sore throat.   Eyes:  Negative for pain, redness and visual disturbance.  Respiratory:  Negative for cough, shortness of breath and wheezing.   Cardiovascular:  Negative for chest pain and  palpitations.  Gastrointestinal:  Negative for abdominal pain, blood in stool, constipation and diarrhea.  Endocrine: Negative for polydipsia and polyuria.  Genitourinary:  Negative for dysuria, frequency and urgency.  Musculoskeletal:  Negative for arthralgias, back pain and myalgias.  Skin:  Negative for pallor and rash.  Allergic/Immunologic: Negative for environmental allergies.  Neurological:  Negative for dizziness, syncope and headaches.  Hematological:  Negative for adenopathy. Does not bruise/bleed easily.  Psychiatric/Behavioral:  Negative for decreased concentration and dysphoric mood. The patient is not nervous/anxious.       Objective:   Physical Exam Constitutional:      General: She is not in acute distress.    Appearance: Normal appearance. She is well-developed and normal weight. She is not ill-appearing or diaphoretic.  HENT:     Head: Normocephalic and atraumatic.     Right Ear: Tympanic membrane, ear canal and external ear normal.     Left Ear: Tympanic membrane, ear canal and external ear normal.     Nose: Nose normal. No congestion.     Mouth/Throat:     Mouth: Mucous membranes are moist.     Pharynx: Oropharynx is clear. No posterior oropharyngeal erythema.  Eyes:     General: No scleral icterus.    Extraocular Movements: Extraocular movements intact.     Conjunctiva/sclera: Conjunctivae normal.     Pupils: Pupils are equal, round, and reactive to light.  Neck:     Thyroid: No thyromegaly.     Vascular: No carotid bruit or JVD.  Cardiovascular:     Rate and Rhythm: Normal rate and regular rhythm.     Pulses: Normal pulses.     Heart sounds: Normal heart sounds.    No gallop.  Pulmonary:     Effort: Pulmonary effort is normal. No respiratory  distress.     Breath sounds: Normal breath sounds. No wheezing.     Comments: Good air exch Chest:     Chest wall: No tenderness.  Abdominal:     General: Bowel sounds are normal. There is no distension or  abdominal bruit.     Palpations: Abdomen is soft. There is no mass.     Tenderness: There is no abdominal tenderness.     Hernia: No hernia is present.  Genitourinary:    Comments: Breast exam: No mass, nodules, thickening, tenderness, bulging, retraction, inflamation, nipple discharge or skin changes noted.  No axillary or clavicular LA.     Musculoskeletal:        General: No tenderness. Normal range of motion.     Cervical back: Normal range of motion and neck supple. No rigidity. No muscular tenderness.     Right lower leg: No edema.     Left lower leg: No edema.     Comments: No kyphosis   Lymphadenopathy:     Cervical: No cervical adenopathy.  Skin:    General: Skin is warm and dry.     Coloration: Skin is not pale.     Findings: No erythema or rash.  Neurological:     Mental Status: She is alert. Mental status is at baseline.     Cranial Nerves: No cranial nerve deficit.     Motor: No abnormal muscle tone.     Coordination: Coordination normal.     Gait: Gait normal.     Deep Tendon Reflexes: Reflexes are normal and symmetric. Reflexes normal.  Psychiatric:        Mood and Affect: Mood normal.        Cognition and Memory: Cognition and memory normal.          Assessment & Plan:   Problem List Items Addressed This Visit       Endocrine   Hypothyroidism    TSH ordered euthyrox 88 mcg daily  No clinical changes        Musculoskeletal and Integument   Osteopenia    dexa ordered  Rev dexa 2/20 No falls  Had a knee fracture -healing  Taking vit D and getting back to exercise regularly   In light of fracture may want to consider treatment         Other   Hyperlipidemia   Routine general medical examination at a health care facility    Reviewed health habits including diet and exercise and skin cancer prevention Reviewed appropriate screening tests for age  Also reviewed health mt list, fam hx and immunization status , as well as social and family  history   See HPI Labs ordered Flu shot given Colonoscopy and mammogram utd  Interested in shingrix if covered  No falls /one fracture  dexa ordered , taking vit D Materials given to work on advance directive No cognitive concerns Hearing screen reviewed/has had ENT eval Vision screen reviewed and eye exam is scheduled PHQ score of ) No problems with ADLS      Estrogen deficiency   Relevant Orders   DG Bone Density   Medicare annual wellness visit, subsequent    Reviewed health habits including diet and exercise and skin cancer prevention Reviewed appropriate screening tests for age  Also reviewed health mt list, fam hx and immunization status , as well as social and family history   See HPI Labs ordered Flu shot given Colonoscopy and mammogram utd  Interested in shingrix if  covered  No falls /one fracture  dexa ordered , taking vit D Materials given to work on advance directive No cognitive concerns Hearing screen reviewed/has had ENT eval Vision screen reviewed and eye exam is scheduled PHQ score of ) No problems with ADLS      Other Visit Diagnoses     Need for influenza vaccination    -  Primary   Relevant Orders   Flu Vaccine QUAD High Dose(Fluad) (Completed)

## 2021-01-04 NOTE — Assessment & Plan Note (Signed)
Reviewed health habits including diet and exercise and skin cancer prevention Reviewed appropriate screening tests for age  Also reviewed health mt list, fam hx and immunization status , as well as social and family history   See HPI Labs ordered Flu shot given Colonoscopy and mammogram utd  Interested in shingrix if covered  No falls /one fracture  dexa ordered , taking vit D Materials given to work on advance directive No cognitive concerns Hearing screen reviewed/has had ENT eval Vision screen reviewed and eye exam is scheduled PHQ score of ) No problems with ADLS

## 2021-01-04 NOTE — Assessment & Plan Note (Signed)
dexa ordered  Rev dexa 2/20 No falls  Had a knee fracture -healing  Taking vit D and getting back to exercise regularly   In light of fracture may want to consider treatment

## 2021-01-04 NOTE — Assessment & Plan Note (Signed)
TSH ordered euthyrox 88 mcg daily  No clinical changes

## 2021-01-06 ENCOUNTER — Other Ambulatory Visit: Payer: Self-pay | Admitting: Family Medicine

## 2021-01-28 ENCOUNTER — Other Ambulatory Visit: Payer: Self-pay

## 2021-01-28 ENCOUNTER — Encounter: Payer: Self-pay | Admitting: Dermatology

## 2021-01-28 ENCOUNTER — Ambulatory Visit (INDEPENDENT_AMBULATORY_CARE_PROVIDER_SITE_OTHER): Payer: Medicare HMO | Admitting: Dermatology

## 2021-01-28 DIAGNOSIS — L814 Other melanin hyperpigmentation: Secondary | ICD-10-CM | POA: Diagnosis not present

## 2021-01-28 DIAGNOSIS — Z1283 Encounter for screening for malignant neoplasm of skin: Secondary | ICD-10-CM | POA: Diagnosis not present

## 2021-01-28 DIAGNOSIS — L719 Rosacea, unspecified: Secondary | ICD-10-CM

## 2021-01-28 DIAGNOSIS — Z872 Personal history of diseases of the skin and subcutaneous tissue: Secondary | ICD-10-CM | POA: Diagnosis not present

## 2021-01-28 DIAGNOSIS — D229 Melanocytic nevi, unspecified: Secondary | ICD-10-CM

## 2021-01-28 DIAGNOSIS — D1800 Hemangioma unspecified site: Secondary | ICD-10-CM | POA: Diagnosis not present

## 2021-01-28 DIAGNOSIS — L578 Other skin changes due to chronic exposure to nonionizing radiation: Secondary | ICD-10-CM | POA: Diagnosis not present

## 2021-01-28 DIAGNOSIS — L820 Inflamed seborrheic keratosis: Secondary | ICD-10-CM

## 2021-01-28 DIAGNOSIS — L570 Actinic keratosis: Secondary | ICD-10-CM | POA: Diagnosis not present

## 2021-01-28 DIAGNOSIS — L821 Other seborrheic keratosis: Secondary | ICD-10-CM

## 2021-01-28 MED ORDER — MOMETASONE FUROATE 0.1 % EX CREA: TOPICAL_CREAM | CUTANEOUS | 0 refills | Status: DC

## 2021-01-28 NOTE — Patient Instructions (Addendum)
Cryotherapy Aftercare  Wash gently with soap and water everyday.   Apply Vaseline and Band-Aid daily until healed.     Seborrheic Keratosis  What causes seborrheic keratoses? Seborrheic keratoses are harmless, common skin growths that first appear during adult life.  As time goes by, more growths appear.  Some people may develop a large number of them.  Seborrheic keratoses appear on both covered and uncovered body parts.  They are not caused by sunlight.  The tendency to develop seborrheic keratoses can be inherited.  They vary in color from skin-colored to gray, brown, or even black.  They can be either smooth or have a rough, warty surface.   Seborrheic keratoses are superficial and look as if they were stuck on the skin.  Under the microscope this type of keratosis looks like layers upon layers of skin.  That is why at times the top layer may seem to fall off, but the rest of the growth remains and re-grows.    Treatment Seborrheic keratoses do not need to be treated, but can easily be removed in the office.  Seborrheic keratoses often cause symptoms when they rub on clothing or jewelry.  Lesions can be in the way of shaving.  If they become inflamed, they can cause itching, soreness, or burning.  Removal of a seborrheic keratosis can be accomplished by freezing, burning, or surgery. If any spot bleeds, scabs, or grows rapidly, please return to have it checked, as these can be an indication of a skin cancer.  Recommend OTC Gold Bond Rapid Relief Anti-Itch cream (pramoxine + menthol), CeraVe Anti-itch cream or lotion (pramoxine), Sarna lotion (Original- menthol + camphor or Sensitive- pramoxine) or Eucerin 12 hour Itch Relief lotion (menthol) up to 3 times per day to areas on body that are itchy.  Topical steroids (such as triamcinolone, fluocinolone, fluocinonide, mometasone, clobetasol, halobetasol, betamethasone, hydrocortisone) can cause thinning and lightening of the skin if they are used  for too long in the same area. Your physician has selected the right strength medicine for your problem and area affected on the body. Please use your medication only as directed by your physician to prevent side effects.   Rosacea  What is rosacea? Rosacea (say: ro-zay-sha) is a common skin disease that usually begins as a trend of flushing or blushing easily.  As rosacea progresses, a persistent redness in the center of the face will develop and may gradually spread beyond the nose and cheeks to the forehead and chin.  In some cases, the ears, chest, and back could be affected.  Rosacea may appear as tiny blood vessels or small red bumps that occur in crops.  Frequently they can contain pus, and are called "pustules".  If the bumps do not contain pus, they are referred to as "papules".  Rarely, in prolonged, untreated cases of rosacea, the oil glands of the nose and cheeks may become permanently enlarged.  This is called rhinophyma, and is seen more frequently in men.  Signs and Risks In its beginning stages, rosacea tends to come and go, which makes it difficult to recognize.  It can start as intermittent flushing of the face.  Eventually, blood vessels may become permanently visible.  Pustules and papules can appear, but can be mistaken for adult acne.  People of all races, ages, genders and ethnic groups are at risk of developing rosacea.  However, it is more common in women (especially around menopause) and adults with fair skin between the ages of 74 and  50.  Treatment Dermatologists typically recommend a combination of treatments to effectively manage rosacea.  Treatment can improve symptoms and may stop the progression of the rosacea.  Treatment may involve both topical and oral medications.  The tetracycline antibiotics are often used for their anti-inflammatory effect; however, because of the possibility of developing antibiotic resistance, they should not be used long term at full dose.  For  dilated blood vessels the options include electrodessication (uses electric current through a small needle), laser treatment, and cosmetics to hide the redness.   With all forms of treatment, improvement is a slow process, and patients may not see any results for the first 3-4 weeks.  It is very important to avoid the sun and other triggers.  Patients must wear sunscreen daily.  Skin Care Instructions: Cleanse the skin with a mild soap such as CeraVe cleanser, Cetaphil cleanser, or Dove soap once or twice daily as needed. Moisturize with Eucerin Redness Relief Daily Perfecting Lotion (has a subtle green tint), CeraVe Moisturizing Cream, or Oil of Olay Daily Moisturizer with sunscreen every morning and/or night as recommended. Makeup should be "non-comedogenic" (won't clog pores) and be labeled "for sensitive skin". Good choices for cosmetics are: Neutrogena, Almay, and Physician's Formula.  Any product with a green tint tends to offset a red complexion. If your eyes are dry and irritated, use artificial tears 2-3 times per day and cleanse the eyelids daily with baby shampoo.  Have your eyes examined at least every 2 years.  Be sure to tell your eye doctor that you have rosacea. Alcoholic beverages tend to cause flushing of the skin, and may make rosacea worse. Always wear sunscreen, protect your skin from extreme hot and cold temperatures, and avoid spicy foods, hot drinks, and mechanical irritation such as rubbing, scrubbing, or massaging the face.  Avoid harsh skin cleansers, cleansing masks, astringents, and exfoliation. If a particular product burns or makes your face feel tight, then it is likely to flare your rosacea. If you are having difficulty finding a sunscreen that you can tolerate, you may try switching to a chemical-free sunscreen.  These are ones whose active ingredient is zinc oxide or titanium dioxide only.  They should also be fragrance free, non-comedogenic, and labeled for sensitive  skin. Rosacea triggers may vary from person to person.  There are a variety of foods that have been reported to trigger rosacea.  Some patients find that keeping a diary of what they were doing when they flared helps them avoid triggers.   If you have any questions or concerns for your doctor, please call our main line at [phone] and press option 4 to reach your doctor's medical assistant. If no one answers, please leave a voicemail as directed and we will return your call as soon as possible. Messages left after 4 pm will be answered the following business day.   You may also send [place] a message via [place]. We typically respond to MyChart messages within 1-2 business days.  For prescription refills, please ask your pharmacy to contact our office. Our fax number is [phone].  If you have an urgent issue when the clinic is closed that cannot wait until the next business day, you can page your doctor at the number below.    Please note that while we do our best to be available for urgent issues outside of office hours, we are not available 24/7.   If you have an urgent issue and are unable to reach [place], you  may choose to seek medical care at your doctor's office, retail clinic, urgent care center, or emergency room.  If you have a medical emergency, please immediately call 911 or go to the emergency department.  Pager Numbers  - Dr. Nehemiah Massed: [phone]  - Dr. Laurence Ferrari: [phone]  - Dr. Nicole Kindred: [phone]  In the event of inclement weather, please call our main line at [phone] for an update on the status of any delays or closures.  Dermatology Medication Tips: Please keep the boxes that topical medications come in in order to help keep track of the instructions about where and how to use these. Pharmacies typically print the medication instructions only on the boxes and not directly on the medication tubes.   If your medication is too expensive, please contact our office  at [phone] option 4 or send [place] a message through [place].   We are unable to tell what your co-pay for medications will be in advance as this is different depending on your insurance coverage. However, we may be able to find a substitute medication at lower cost or fill out paperwork to get insurance to cover a needed medication.   If a prior authorization is required to get your medication covered by your insurance company, please allow [place] 1-2 business days to complete this process.  Drug prices often vary depending on where the prescription is filled and some pharmacies may offer cheaper prices.  The website www.goodrx.com contains coupons for medications through different pharmacies. The prices here do not account for what the cost may be with help from insurance (it may be cheaper with your insurance), but the website can give you the price if you did not use any insurance.  - You can print the associated coupon and take it with your prescription to the pharmacy.  - You may also stop by our office during regular business hours and pick up a GoodRx coupon card.  - If you need your prescription sent electronically to a different pharmacy, notify our office through [facility] or by phone at [phone] option 4.

## 2021-01-28 NOTE — Progress Notes (Signed)
Follow-Up Visit   Subjective  Katrina [name] is a 70 y.o. female who presents for the following: Annual Exam.  Patient here for TBSE. She has a history of precancers, no history of skin cancer. She has a few spots to check on her back, left jaw and forehead, and right hand, and legs, some irritating.   The following portions of the chart were reviewed this encounter and updated as appropriate:       Review of Systems:  No other skin or systemic complaints except as noted in HPI or Assessment and Plan.  Objective  Well appearing patient in no apparent distress; mood and affect are within normal limits.  A full examination was performed including scalp, head, eyes, ears, nose, lips, neck, chest, axillae, abdomen, back, buttocks, bilateral upper extremities, bilateral lower extremities, hands, feet, fingers, toes, fingernails, and toenails. All findings within normal limits unless otherwise noted below.  R hand 2nd MCP Pink scaly macule.   R post shoulder x 1, R glabella x 1, L post shoulder x 2, R med upper thigh x 1, L lat thigh x 1, R med ankle x 1, L lat ankle x 1, L mandible x 3 (11) Erythematous keratotic or waxy stuck-on papule Several ISKs BL pretibia with excoriations  nose, cheeks Erythema of the nose and cheeks.   Assessment & Plan  Skin cancer screening performed today.  Actinic Damage - chronic, secondary to cumulative UV radiation exposure/sun exposure over time - diffuse scaly erythematous macules with underlying dyspigmentation - Recommend daily broad spectrum sunscreen SPF 30+ to sun-exposed areas, reapply every 2 hours as needed.  - Recommend staying in the shade or wearing long sleeves, sun glasses (UVA+UVB protection) and wide brim hats (4-inch brim around the entire circumference of the hat). - Call for new or changing lesions.  Lentigines - Scattered tan macules - Due to sun exposure - Benign-appering, observe - Recommend daily broad spectrum  sunscreen SPF 30+ to sun-exposed areas, reapply every 2 hours as needed. - Call for any changes  Seborrheic Keratoses - Stuck-on, waxy, tan-brown papules and/or plaques  - Benign-appearing - Discussed benign etiology and prognosis. - Observe - Call for any changes - Recommend OTC Amlactin cream  Hemangiomas - Red papules - Discussed benign nature - Observe - Call for any changes  Melanocytic Nevi - Tan-brown and/or pink-flesh-colored symmetric macules and papules - Benign appearing on exam today - Observation - Call clinic for new or changing moles - Recommend daily use of broad spectrum spf 30+ sunscreen to sun-exposed areas.   AK (actinic keratosis) R hand 2nd MCP  Actinic keratoses are precancerous spots that appear secondary to cumulative UV radiation exposure/sun exposure over time. They are chronic with expected duration over 1 year. A portion of actinic keratoses will progress to squamous cell carcinoma of the skin. It is not possible to reliably predict which spots will progress to skin cancer and so treatment is recommended to prevent development of skin cancer.  Recommend daily broad spectrum sunscreen SPF 30+ to sun-exposed areas, reapply every 2 hours as needed.  Recommend staying in the shade or wearing long sleeves, sun glasses (UVA+UVB protection) and wide brim hats (4-inch brim around the entire circumference of the hat). Call for new or changing lesions.  Destruction of lesion - R hand 2nd MCP  Destruction method: cryotherapy   Informed consent: discussed and consent obtained   Lesion destroyed using liquid nitrogen: Yes   Region frozen until ice ball extended beyond  lesion: Yes   Outcome: patient tolerated procedure well with no complications   Post-procedure details: wound care instructions given   Additional details:  Prior to procedure, discussed risks of blister formation, small wound, skin dyspigmentation, or rare scar following cryotherapy. Recommend  Vaseline ointment to treated areas while healing.   Inflamed seborrheic keratosis R post shoulder x 1, R glabella x 1, L post shoulder x 2, R med upper thigh x 1, L lat thigh x 1, R med ankle x 1, L lat ankle x 1, L mandible x 3  Cryotherapy except to Isks BL pretibia Start mometasone cream Spot treat AA lower legs BID prn itch no longer than 1 month dsp 45g 0Rf. Avoid face, groin, axilla. Start Amlactin rapid relief cream OTC qd/bid   Topical steroids (such as triamcinolone, fluocinolone, fluocinonide, mometasone, clobetasol, halobetasol, betamethasone, hydrocortisone) can cause thinning and lightening of the skin if they are used for too long in the same area. Your physician has selected the right strength medicine for your problem and area affected on the body. Please use your medication only as directed by your physician to prevent side effects.    Destruction of lesion - R post shoulder x 1, R glabella x 1, L post shoulder x 2, R med upper thigh x 1, L lat thigh x 1, R med ankle x 1, L lat ankle x 1, L mandible x 3  Destruction method: cryotherapy   Informed consent: discussed and consent obtained   Lesion destroyed using liquid nitrogen: Yes   Region frozen until ice ball extended beyond lesion: Yes   Outcome: patient tolerated procedure well with no complications   Post-procedure details: wound care instructions given   Additional details:  Prior to procedure, discussed risks of blister formation, small wound, skin dyspigmentation, or rare scar following cryotherapy. Recommend Vaseline ointment to treated areas while healing.   mometasone (ELOCON) 0.1 % cream - R post shoulder x 1, R glabella x 1, L post shoulder x 2, R med upper thigh x 1, L lat thigh x 1, R med ankle x 1, L lat ankle x 1, L mandible x 3 Apply topically twice a day to pink spots on lower legs until clear, no longer than 1 month. Avoid face, groin, axilla.  Rosacea nose, cheeks  Rosacea is a chronic progressive  skin condition usually affecting the face of adults, causing redness and/or acne bumps. It is treatable but not curable. It sometimes affects the eyes (ocular rosacea) as well. It may respond to topical and/or systemic medication and can flare with stress, sun exposure, alcohol, exercise and some foods.  Daily application of broad spectrum spf 30+ sunscreen to face is recommended to reduce flares.  Discussed Rhofade Cream qam to help with redness. Defers today. Patient will call for prescription if worsens.     Return in about 1 year (around 01/28/2022) for TBSE.  IJamesetta Orleans, CMA, am acting as scribe for Brendolyn Patty, MD . Documentation: I have reviewed the above documentation for accuracy and completeness, and I agree with the above.  Brendolyn Patty MD

## 2021-02-22 DIAGNOSIS — H40052 Ocular hypertension, left eye: Secondary | ICD-10-CM | POA: Diagnosis not present

## 2021-02-22 DIAGNOSIS — H25093 Other age-related incipient cataract, bilateral: Secondary | ICD-10-CM | POA: Diagnosis not present

## 2021-03-21 DIAGNOSIS — H0011 Chalazion right upper eyelid: Secondary | ICD-10-CM | POA: Diagnosis not present

## 2021-05-01 DIAGNOSIS — S86111A Strain of other muscle(s) and tendon(s) of posterior muscle group at lower leg level, right leg, initial encounter: Secondary | ICD-10-CM | POA: Diagnosis not present

## 2021-05-01 DIAGNOSIS — M25561 Pain in right knee: Secondary | ICD-10-CM | POA: Diagnosis not present

## 2021-05-01 DIAGNOSIS — G8929 Other chronic pain: Secondary | ICD-10-CM | POA: Diagnosis not present

## 2021-05-06 ENCOUNTER — Ambulatory Visit: Payer: Medicare HMO | Admitting: Dermatology

## 2021-05-28 DIAGNOSIS — S86111A Strain of other muscle(s) and tendon(s) of posterior muscle group at lower leg level, right leg, initial encounter: Secondary | ICD-10-CM | POA: Diagnosis not present

## 2021-06-10 DIAGNOSIS — M79604 Pain in right leg: Secondary | ICD-10-CM | POA: Diagnosis not present

## 2021-06-10 DIAGNOSIS — M6281 Muscle weakness (generalized): Secondary | ICD-10-CM | POA: Diagnosis not present

## 2021-06-10 DIAGNOSIS — S86111A Strain of other muscle(s) and tendon(s) of posterior muscle group at lower leg level, right leg, initial encounter: Secondary | ICD-10-CM | POA: Diagnosis not present

## 2021-06-19 DIAGNOSIS — M25561 Pain in right knee: Secondary | ICD-10-CM | POA: Diagnosis not present

## 2021-06-19 DIAGNOSIS — G8929 Other chronic pain: Secondary | ICD-10-CM | POA: Diagnosis not present

## 2021-06-20 ENCOUNTER — Other Ambulatory Visit: Payer: Self-pay | Admitting: Sports Medicine

## 2021-06-20 DIAGNOSIS — G8929 Other chronic pain: Secondary | ICD-10-CM

## 2021-06-27 ENCOUNTER — Other Ambulatory Visit: Payer: Self-pay

## 2021-06-27 ENCOUNTER — Ambulatory Visit
Admission: RE | Admit: 2021-06-27 | Discharge: 2021-06-27 | Disposition: A | Discharge status: Home / Self Care | Payer: Medicare PPO | Source: Ambulatory Visit | Attending: Sports Medicine | Admitting: Sports Medicine

## 2021-06-27 DIAGNOSIS — M7121 Synovial cyst of popliteal space [Baker], right knee: Secondary | ICD-10-CM | POA: Diagnosis not present

## 2021-06-27 DIAGNOSIS — G8929 Other chronic pain: Secondary | ICD-10-CM | POA: Insufficient documentation

## 2021-06-27 DIAGNOSIS — M25461 Effusion, right knee: Secondary | ICD-10-CM | POA: Diagnosis not present

## 2021-06-27 DIAGNOSIS — M25561 Pain in right knee: Secondary | ICD-10-CM | POA: Diagnosis not present

## 2021-06-27 DIAGNOSIS — S83241A Other tear of medial meniscus, current injury, right knee, initial encounter: Secondary | ICD-10-CM | POA: Diagnosis not present

## 2021-07-03 DIAGNOSIS — M25511 Pain in right shoulder: Secondary | ICD-10-CM | POA: Diagnosis not present

## 2021-07-04 ENCOUNTER — Other Ambulatory Visit: Payer: Self-pay | Admitting: Orthopedic Surgery

## 2021-07-09 ENCOUNTER — Encounter
Admission: RE | Admit: 2021-07-09 | Discharge: 2021-07-09 | Disposition: A | Discharge status: Home / Self Care | Payer: Medicare PPO | Source: Ambulatory Visit | Attending: Orthopedic Surgery | Admitting: Orthopedic Surgery

## 2021-07-09 NOTE — Patient Instructions (Addendum)
Your procedure is scheduled on: Friday, April 7 ?Report to the Registration Desk on the 1st floor of the [facility]. ?To find out your arrival time, please call [phone] between 1PM - 3PM on: Thursday, April 6 ? ?REMEMBER: ?Instructions that are not followed completely may result in serious medical risk, up to and including death; or upon the discretion of your surgeon and anesthesiologist your surgery may need to be rescheduled. ? ?Do not eat food after midnight the night before surgery.  ?No gum chewing, lozengers or hard candies. ? ?You may however, drink CLEAR liquids up to 2 hours before you are scheduled to arrive for your surgery. Do not drink anything within 2 hours of your scheduled arrival time. ? ?Clear liquids include: ?- water  ?- apple juice without pulp ?- gatorade (not RED colors) ?- black coffee or tea (Do NOT add milk or creamers to the coffee or tea) ?Do NOT drink anything that is not on this list. ? ?In addition, your doctor has ordered for you to drink the provided  ?Ensure Pre-Surgery Clear Carbohydrate Drink  ?Drinking this carbohydrate drink up to two hours before surgery helps to reduce insulin resistance and improve patient outcomes. Please complete drinking 2 hours prior to scheduled arrival time. ? ?TAKE THESE MEDICATIONS THE MORNING OF SURGERY WITH A SIP OF WATER: ? ?Albuterol inhaler ?Levothyroxine ? ?Use inhalers on the day of surgery and bring to the hospital. ? ?One week prior to surgery: ?Stop MELOXICAM and Anti-inflammatories (NSAIDS) such as Advil, Aleve, Ibuprofen, Motrin, Naproxen, Naprosyn and Aspirin based products such as Excedrin, Goodys Powder, BC Powder. ?Stop ANY OVER THE COUNTER supplements until after surgery. Stop calcium, multiple vitamins, vitamin C. ?You may however, continue to take Tylenol if needed for pain up until the day of surgery. ? ?No Alcohol for 24 hours before or after surgery. ? ?No Smoking including e-cigarettes for 24 hours prior to surgery.   ?No chewable tobacco products for at least 6 hours prior to surgery.  ?No nicotine patches on the day of surgery. ? ?Do not use any "recreational" drugs for at least a week prior to your surgery.  ?Please be advised that the combination of cocaine and anesthesia may have negative outcomes, up to and including death. ?If you test positive for cocaine, your surgery will be cancelled. ? ?On the morning of surgery brush your teeth with toothpaste and water, you may rinse your mouth with mouthwash if you wish. ?Do not swallow any toothpaste or mouthwash. ? ?Use CHG Soap as directed on instruction sheet. ? ?Do not wear jewelry, make-up, hairpins, clips or nail polish. ? ?Do not wear lotions, powders, or perfumes.  ? ?Do not shave body from the neck down 48 hours prior to surgery just in case you cut yourself which could leave a site for infection.  ?Also, freshly shaved skin may become irritated if using the CHG soap. ? ?Contact lenses, hearing aids and dentures may not be worn into surgery. ? ?Do not bring valuables to the hospital. [facility] is not responsible for any missing/lost belongings or valuables.  ? ?Notify your doctor if there is any change in your medical condition (cold, fever, infection). ? ?Wear comfortable clothing (specific to your surgery type) to the hospital. ? ?After surgery, you can help prevent lung complications by doing breathing exercises.  ?Take deep breaths and cough every 1-2 hours. Your doctor may order a device called an Incentive Spirometer to help you take deep breaths. ? ?If  you are being discharged the day of surgery, you will not be allowed to drive home. ?You will need a responsible adult (18 years or older) to drive you home and stay with you that night.  ? ?If you are taking public transportation, you will need to have a responsible adult (18 years or older) with you. ?Please confirm with your physician that it is acceptable to use public transportation.  ? ?Please call the  [facility] at [phone] if you have any questions about these instructions. ? ?Surgery Visitation Policy: ? ?Patients undergoing a surgery or procedure may have two family members or support persons with them as long as the person is not COVID-19 positive or experiencing its symptoms.  ?

## 2021-07-10 ENCOUNTER — Encounter
Admission: RE | Admit: 2021-07-10 | Discharge: 2021-07-10 | Disposition: A | Discharge status: Home / Self Care | Payer: Medicare PPO | Source: Ambulatory Visit | Attending: Orthopedic Surgery | Admitting: Orthopedic Surgery

## 2021-07-10 DIAGNOSIS — Z01810 Encounter for preprocedural cardiovascular examination: Secondary | ICD-10-CM | POA: Diagnosis not present

## 2021-07-12 ENCOUNTER — Other Ambulatory Visit: Payer: Self-pay

## 2021-07-12 ENCOUNTER — Ambulatory Visit: Payer: Medicare PPO | Admitting: Urgent Care

## 2021-07-12 ENCOUNTER — Ambulatory Visit: Payer: Medicare PPO

## 2021-07-12 ENCOUNTER — Ambulatory Visit
Admission: RE | Admit: 2021-07-12 | Discharge: 2021-07-12 | Disposition: A | Discharge status: Home / Self Care | Payer: Medicare PPO | Source: Ambulatory Visit | Attending: Orthopedic Surgery | Admitting: Orthopedic Surgery

## 2021-07-12 ENCOUNTER — Encounter
Admission: RE | Disposition: A | Discharge status: Home / Self Care | Payer: Self-pay | Source: Ambulatory Visit | Attending: Orthopedic Surgery

## 2021-07-12 ENCOUNTER — Encounter: Payer: Self-pay | Admitting: Orthopedic Surgery

## 2021-07-12 DIAGNOSIS — G8918 Other acute postprocedural pain: Secondary | ICD-10-CM | POA: Diagnosis not present

## 2021-07-12 DIAGNOSIS — S83241A Other tear of medial meniscus, current injury, right knee, initial encounter: Secondary | ICD-10-CM | POA: Insufficient documentation

## 2021-07-12 DIAGNOSIS — M2242 Chondromalacia patellae, left knee: Secondary | ICD-10-CM | POA: Diagnosis not present

## 2021-07-12 DIAGNOSIS — S83231A Complex tear of medial meniscus, current injury, right knee, initial encounter: Secondary | ICD-10-CM | POA: Diagnosis not present

## 2021-07-12 DIAGNOSIS — M1711 Unilateral primary osteoarthritis, right knee: Secondary | ICD-10-CM | POA: Diagnosis not present

## 2021-07-12 DIAGNOSIS — X58XXXA Exposure to other specified factors, initial encounter: Secondary | ICD-10-CM | POA: Insufficient documentation

## 2021-07-12 DIAGNOSIS — M25561 Pain in right knee: Secondary | ICD-10-CM | POA: Diagnosis not present

## 2021-07-12 DIAGNOSIS — S83242A Other tear of medial meniscus, current injury, left knee, initial encounter: Secondary | ICD-10-CM | POA: Diagnosis not present

## 2021-07-12 HISTORY — PX: CHONDROPLASTY: SHX5177

## 2021-07-12 HISTORY — PX: KNEE ARTHROSCOPY WITH MEDIAL MENISECTOMY: SHX5651

## 2021-07-12 SURGERY — ARTHROSCOPY, KNEE, WITH MEDIAL MENISCECTOMY
Anesthesia: General | Site: Knee | Laterality: Right

## 2021-07-12 MED ORDER — EPHEDRINE SULFATE (PRESSORS) 50 MG/ML IJ SOLN
INTRAMUSCULAR | Status: DC | PRN
  Administered 2021-07-12: 5 mg via INTRAVENOUS

## 2021-07-12 MED ORDER — LIDOCAINE-EPINEPHRINE 1 %-1:100000 IJ SOLN
INTRAMUSCULAR | Status: AC
  Filled 2021-07-12: qty 1

## 2021-07-12 MED ORDER — ORAL CARE MOUTH RINSE: 15.0000 mL | Freq: Once | OROMUCOSAL | Status: AC

## 2021-07-12 MED ORDER — CHLORHEXIDINE GLUCONATE 0.12 % MT SOLN
15.0000 mL | Freq: Once | OROMUCOSAL | Status: AC
  Administered 2021-07-12: 15 mL via OROMUCOSAL

## 2021-07-12 MED ORDER — OXYCODONE HCL 5 MG PO TABS: 5.0000 mg | ORAL_TABLET | ORAL | 0 refills | Status: DC | PRN

## 2021-07-12 MED ORDER — LIDOCAINE HCL (PF) 1 % IJ SOLN
INTRAMUSCULAR | Status: DC | PRN
  Administered 2021-07-12: 5 mL via SUBCUTANEOUS

## 2021-07-12 MED ORDER — BUPIVACAINE HCL (PF) 0.5 % IJ SOLN
INTRAMUSCULAR | Status: AC
  Filled 2021-07-12: qty 30

## 2021-07-12 MED ORDER — ROPIVACAINE HCL 5 MG/ML IJ SOLN
INTRAMUSCULAR | Status: DC | PRN
  Administered 2021-07-12: 30 mL via PERINEURAL

## 2021-07-12 MED ORDER — LIDOCAINE HCL (PF) 2 % IJ SOLN
INTRAMUSCULAR | Status: AC
  Filled 2021-07-12: qty 5

## 2021-07-12 MED ORDER — CHLORHEXIDINE GLUCONATE 0.12 % MT SOLN
OROMUCOSAL | Status: AC
  Filled 2021-07-12: qty 15

## 2021-07-12 MED ORDER — MIDAZOLAM HCL 2 MG/2ML IJ SOLN
INTRAMUSCULAR | Status: DC | PRN
  Administered 2021-07-12: 2 mg via INTRAVENOUS

## 2021-07-12 MED ORDER — ACETAMINOPHEN 10 MG/ML IV SOLN: 1000.0000 mg | Freq: Once | INTRAVENOUS | Status: DC | PRN

## 2021-07-12 MED ORDER — ONDANSETRON HCL 4 MG/2ML IJ SOLN: 4.0000 mg | Freq: Once | INTRAMUSCULAR | Status: DC | PRN

## 2021-07-12 MED ORDER — OXYCODONE HCL 5 MG/5ML PO SOLN: 5.0000 mg | Freq: Once | ORAL | Status: DC | PRN

## 2021-07-12 MED ORDER — LACTATED RINGERS IR SOLN
Status: DC | PRN
  Administered 2021-07-12 (×6): 3000 mL

## 2021-07-12 MED ORDER — FENTANYL CITRATE (PF) 100 MCG/2ML IJ SOLN
25.0000 ug | INTRAMUSCULAR | Status: DC | PRN
  Administered 2021-07-12: 50 ug via INTRAVENOUS

## 2021-07-12 MED ORDER — ASPIRIN EC 325 MG PO TBEC: 325.0000 mg | DELAYED_RELEASE_TABLET | Freq: Every day | ORAL | 0 refills | Status: AC

## 2021-07-12 MED ORDER — LACTATED RINGERS IV SOLN
INTRAVENOUS | Status: DC
Start: 2021-07-12 — End: 2021-07-12

## 2021-07-12 MED ORDER — FAMOTIDINE 20 MG PO TABS
ORAL_TABLET | ORAL | Status: AC
  Filled 2021-07-12: qty 1

## 2021-07-12 MED ORDER — DEXAMETHASONE SODIUM PHOSPHATE 10 MG/ML IJ SOLN
INTRAMUSCULAR | Status: AC
  Filled 2021-07-12: qty 1

## 2021-07-12 MED ORDER — ONDANSETRON 4 MG PO TBDP: 4.0000 mg | ORAL_TABLET | Freq: Three times a day (TID) | ORAL | 0 refills | Status: DC | PRN

## 2021-07-12 MED ORDER — ONDANSETRON HCL 4 MG/2ML IJ SOLN
INTRAMUSCULAR | Status: DC | PRN
  Administered 2021-07-12: 4 mg via INTRAVENOUS

## 2021-07-12 MED ORDER — ONDANSETRON HCL 4 MG/2ML IJ SOLN
INTRAMUSCULAR | Status: AC
  Filled 2021-07-12: qty 2

## 2021-07-12 MED ORDER — STERILE WATER FOR INJECTION IJ SOLN
INTRAMUSCULAR | Status: AC
  Filled 2021-07-12: qty 20

## 2021-07-12 MED ORDER — ACETAMINOPHEN 500 MG PO TABS: 1000.0000 mg | ORAL_TABLET | Freq: Three times a day (TID) | ORAL | 2 refills | Status: DC

## 2021-07-12 MED ORDER — FENTANYL CITRATE (PF) 100 MCG/2ML IJ SOLN
INTRAMUSCULAR | Status: AC
  Filled 2021-07-12: qty 2

## 2021-07-12 MED ORDER — MIDAZOLAM HCL 2 MG/2ML IJ SOLN
INTRAMUSCULAR | Status: AC
  Filled 2021-07-12: qty 2

## 2021-07-12 MED ORDER — FENTANYL CITRATE (PF) 100 MCG/2ML IJ SOLN
INTRAMUSCULAR | Status: DC | PRN
  Administered 2021-07-12: 25 ug via INTRAVENOUS
  Administered 2021-07-12: 50 ug via INTRAVENOUS
  Administered 2021-07-12: 25 ug via INTRAVENOUS

## 2021-07-12 MED ORDER — FAMOTIDINE 20 MG PO TABS
20.0000 mg | ORAL_TABLET | Freq: Once | ORAL | Status: AC
  Administered 2021-07-12: 20 mg via ORAL

## 2021-07-12 MED ORDER — DEXAMETHASONE SODIUM PHOSPHATE 10 MG/ML IJ SOLN
INTRAMUSCULAR | Status: DC | PRN
  Administered 2021-07-12: 10 mg via INTRAVENOUS

## 2021-07-12 MED ORDER — CEFAZOLIN SODIUM-DEXTROSE 2-4 GM/100ML-% IV SOLN
2.0000 g | INTRAVENOUS | Status: AC
  Administered 2021-07-12: 2 g via INTRAVENOUS

## 2021-07-12 MED ORDER — EPHEDRINE 5 MG/ML INJ
INTRAVENOUS | Status: AC
  Filled 2021-07-12: qty 10

## 2021-07-12 MED ORDER — LIDOCAINE HCL (CARDIAC) PF 100 MG/5ML IV SOSY
PREFILLED_SYRINGE | INTRAVENOUS | Status: DC | PRN
  Administered 2021-07-12: 80 mg via INTRAVENOUS

## 2021-07-12 MED ORDER — LIDOCAINE-EPINEPHRINE 1 %-1:100000 IJ SOLN
INTRAMUSCULAR | Status: DC | PRN
  Administered 2021-07-12: 4 mL via INTRAMUSCULAR

## 2021-07-12 MED ORDER — CEFAZOLIN SODIUM-DEXTROSE 2-4 GM/100ML-% IV SOLN
INTRAVENOUS | Status: AC
  Filled 2021-07-12: qty 100

## 2021-07-12 MED ORDER — LIDOCAINE HCL (PF) 1 % IJ SOLN
INTRAMUSCULAR | Status: AC
Start: 2021-07-12 — End: 2021-07-12
  Filled 2021-07-12: qty 5

## 2021-07-12 MED ORDER — PROPOFOL 10 MG/ML IV BOLUS
INTRAVENOUS | Status: DC | PRN
  Administered 2021-07-12: 150 mg via INTRAVENOUS

## 2021-07-12 MED ORDER — OXYCODONE HCL 5 MG PO TABS: 5.0000 mg | ORAL_TABLET | Freq: Once | ORAL | Status: DC | PRN

## 2021-07-12 MED ORDER — PROPOFOL 10 MG/ML IV BOLUS
INTRAVENOUS | Status: AC
  Filled 2021-07-12: qty 20

## 2021-07-12 MED ORDER — ROPIVACAINE HCL 5 MG/ML IJ SOLN
INTRAMUSCULAR | Status: AC
  Filled 2021-07-12: qty 30

## 2021-07-12 SURGICAL SUPPLY — 41 items
ADAPTER IRRIG TUBE 2 SPIKE SOL (ADAPTER) ×2 IMPLANT
BLADE FULL RADIUS 3.5 (BLADE) ×2 IMPLANT
BLADE SURG SZ11 CARB STEEL (BLADE) ×2 IMPLANT
BNDG ESMARK 6X12 TAN STRL LF (GAUZE/BANDAGES/DRESSINGS) ×1 IMPLANT
CHLORAPREP W/TINT 26 (MISCELLANEOUS) ×2 IMPLANT
COOLER POLAR GLACIER W/PUMP (MISCELLANEOUS) ×2 IMPLANT
COVER LIGHT HANDLE UNIVERSAL (MISCELLANEOUS) ×4 IMPLANT
DRAPE EXTREMITY T 121X128X90 (DISPOSABLE) ×2 IMPLANT
DRAPE IMP U-DRAPE 54X76 (DRAPES) ×2 IMPLANT
GAUZE SPONGE 4X4 12PLY STRL (GAUZE/BANDAGES/DRESSINGS) ×1 IMPLANT
GLOVE SRG 8 PF TXTR STRL LF DI (GLOVE) ×1 IMPLANT
GLOVE SURG ENC MOIS LTX SZ7.5 (GLOVE) ×2 IMPLANT
GLOVE SURG UNDER POLY LF SZ8 (GLOVE) ×2
GOWN STRL REUS W/ TWL LRG LVL3 (GOWN DISPOSABLE) ×1 IMPLANT
GOWN STRL REUS W/TWL LRG LVL3 (GOWN DISPOSABLE) ×2
IMP SYS 2ND FIX PEEK 4.75X19.1 (Miscellaneous) ×2 IMPLANT
IMPL SYS 2ND FX PEEK 4.75X19.1 (Miscellaneous) IMPLANT
IMPL SYS MENISCAL ROOT REPAIR (Orthopedic Implant) ×1 IMPLANT
IV LACTATED RINGER IRRG 3000ML (IV SOLUTION) ×12
IV LR IRRIG 3000ML ARTHROMATIC (IV SOLUTION) ×2 IMPLANT
KIT TURNOVER KIT A (KITS) ×2 IMPLANT
MANIFOLD NEPTUNE II (INSTRUMENTS) ×2 IMPLANT
MAT ABSORB  FLUID 56X50 GRAY (MISCELLANEOUS) ×4
MAT ABSORB FLUID 56X50 GRAY (MISCELLANEOUS) ×1 IMPLANT
PACK ARTHROSCOPY KNEE (MISCELLANEOUS) ×2 IMPLANT
PAD ABD DERMACEA PRESS 5X9 (GAUZE/BANDAGES/DRESSINGS) ×1 IMPLANT
PAD WRAPON POLAR KNEE (MISCELLANEOUS) ×1 IMPLANT
PADDING CAST 6X4YD NS (MISCELLANEOUS) ×1
PADDING CAST COTTON 6X4 NS (MISCELLANEOUS) IMPLANT
SPONGE T-LAP 18X18 ~~LOC~~+RFID (SPONGE) ×2 IMPLANT
SUT ETHILON 3-0 FS-10 30 BLK (SUTURE) ×2
SUT MNCRL 4-0 (SUTURE) ×2
SUT MNCRL 4-0 27XMFL (SUTURE) ×1
SUT VIC AB 2-0 CT2 27 (SUTURE) ×1 IMPLANT
SUTURE EHLN 3-0 FS-10 30 BLK (SUTURE) ×1 IMPLANT
SUTURE MNCRL 4-0 27XMF (SUTURE) IMPLANT
TOWEL OR 17X26 4PK STRL BLUE (TOWEL DISPOSABLE) ×4 IMPLANT
TUBING INFLOW SET DBFLO PUMP (TUBING) ×2 IMPLANT
TUBING OUTFLOW SET DBLFO PUMP (TUBING) ×2 IMPLANT
WAND WEREWOLF FLOW 90D (MISCELLANEOUS) ×2 IMPLANT
WRAPON POLAR PAD KNEE (MISCELLANEOUS) ×2

## 2021-07-12 NOTE — Anesthesia Procedure Notes (Signed)
Anesthesia Regional Block: Adductor canal block  ? ?Pre-Anesthetic Checklist: , timeout performed,  Correct Patient, Correct Site, Correct Laterality,  Correct Procedure, Correct Position, site marked,  Risks and benefits discussed,  Surgical consent,  Pre-op evaluation,  At surgeon's request and post-op pain management ? ?Laterality: Right and Lower ? ?Prep: chloraprep     ?  ?Needles:  ?Injection technique: Single-shot ? ?Needle Type: Echogenic Needle   ? ? ?Needle Length: 9cm  ?Needle Gauge: 21  ? ? ? ?Additional Needles: ? ? ?Procedures:,,,, ultrasound used (permanent image in chart),,    ?Narrative:  ?Start time: 07/12/2021 2:00 PM ?End time: 07/12/2021 2:07 PM ?Injection made incrementally with aspirations every 5 mL. ? ?Performed by: Personally  ?Anesthesiologist: Martha Clan, MD ? ?Additional Notes: ?Functioning IV was confirmed and monitors were applied.  A 37m 22ga Stimuplex needle was used. Sterile prep and drape,hand hygiene and sterile gloves were used.  Negative aspiration and negative test dose prior to incremental administration of local anesthetic. The patient tolerated the procedure well. ? ? ? ? ? ?

## 2021-07-12 NOTE — Discharge Instructions (Addendum)
Arthroscopic Knee Surgery - Meniscus Repair ?  ?Post-Op Instructions ?  ?1. Bracing or crutches: Keep brace locked in extension at all times except as directed by physical therapy. Use walker/crutches to avoid bearing weight on the operative leg.  ?  ?2. Ice: You may be provided with a device [facility]) that allows you to ice the affected area effectively. Otherwise you can ice manually.  ?  ?3. Driving:  Plan on not driving for at least four weeks. Please note that you are advised NOT to drive while taking narcotic pain medications as you may be impaired and unsafe to drive. ?  ?4. Activity: Ankle pumps several times an hour while awake to prevent blood clots. Weight bearing: NO WEIGHT BEARING FOR 4 WEEKS. Use crutches or walker for at least 4 weeks, if not 6 based on your surgery. Bending and straightening the knee is unlimited, but do not flex your knee past 90 degrees until cleared by your therapist. Elevate knee above heart level as much as possible for one week. Avoid standing more than 5 minutes (consecutively) for the first week. No exercise involving the knee until cleared by the surgeon or physical therapist.  Avoid long distance travel for 4 weeks.  ? ?5. Medications:  ?- You have been provided a prescription for narcotic pain medicine. After surgery, take 1-2 narcotic tablets every 4 hours if needed for severe pain. If it has tylenol (acetaminophen), please do not take a total of more than '3000mg'$ /day of tylenol.  ?- A prescription for anti-nausea medication will be provided in case the narcotic medicine causes nausea - take 1 tablet every 6 hours only if nauseated.  ?- Take ibuprofen 800 mg every 8 hours with food to reduce post-operative knee swelling. Or you may take your baseline meloxicam. Do not take both.  ?- Take enteric coated aspirin 325 mg once daily for 4 weeks to prevent blood clots.  ?-Take tylenol 1000 every 8 hours for pain.  May stop tylenol 3 days after surgery or when you are having  minimal pain. If your narcotic has tylenol (acetaminophen), please do not take a total of more than '3000mg'$ /day of tylenol.  ?  ?If you are taking prescription medication for anxiety, depression, insomnia, muscle spasm, chronic pain, or for attention deficit disorder you are advised that you are at a higher risk of adverse effects with use of narcotics post-op, including narcotic addiction/dependence, depressed breathing, death. ?If you use non-prescribed substances: alcohol, marijuana, cocaine, heroin, methamphetamines, etc., you are at a higher risk of adverse effects with use of narcotics post-op, including narcotic addiction/dependence, depressed breathing, death. ?You are advised that taking > 50 morphine milligram equivalents (MME) of narcotic pain medication per day results in twice the risk of overdose or death. For your prescription provided: oxycodone 5 mg - taking more than 6 tablets per day. Be advised that we will prescribe narcotics short-term, for acute post-operative pain only - 1 week for minor operations such as knee arthroscopy for meniscus tear resection, and 3 weeks for major operations such as knee repair/reconstruction surgeries.  ? ?6. Bandages: The physical therapist should change the bandages at the first post-op appointment. If needed, the dressing supplies have been provided to you. You may shower after this with waterproof bandaids covering the incisions.  ?  ?7. Physical Therapy: 2 times per week for the first 4 weeks, then 1-2 times per week from weeks 4-8 post-op. Therapy typically starts on post operative Day 3 or 4. You  have been provided an order for physical therapy. The therapist will provide home exercises. ?  ?8. Work: May return to full work when off of crutches. May do light duty/desk job in approximately 1-2 weeks when off of narcotics, pain is well-controlled, and swelling has decreased. ?  ?9. Post-Op Appointments: ?Your first post-op appointment will be with Dr. Posey Pronto in  approximately 2 weeks time.  ?  ?If you find that they have not been scheduled please call the Orthopaedic Appointment front desk at [phone]. ? ? ?AMBULATORY SURGERY  ?DISCHARGE INSTRUCTIONS ? ? ?The drugs that you were given will stay in your system until tomorrow so for the next 24 hours you should not: ? ?Drive an automobile ?Make any legal decisions ?Drink any alcoholic beverage ? ? ?You may resume regular meals tomorrow.  Today it is better to start with liquids and gradually work up to solid foods. ? ?You may eat anything you prefer, but it is better to start with liquids, then soup and crackers, and gradually work up to solid foods. ? ? ?Please notify your doctor immediately if you have any unusual bleeding, trouble breathing, redness and pain at the surgery site, drainage, fever, or pain not relieved by medication. ? ? ? ?Additional Instructions: ? ? ? ? ? ? ? ?Please contact your physician with any problems or Same Day Surgery at [phone], Monday through Friday 6 am to 4 pm, or [place] at [facility] number at [phone].  ?

## 2021-07-12 NOTE — H&P (Signed)
Paper H&P to be scanned into permanent record. H&P reviewed. No significant changes noted.  

## 2021-07-12 NOTE — Anesthesia Preprocedure Evaluation (Signed)
Anesthesia Evaluation  ?Patient identified by MRN, date of birth, ID band ?Patient awake ? ? ? ?Reviewed: ?Allergy & Precautions, H&P , NPO status , Patient's Chart, lab work & pertinent test results ? ?History of Anesthesia Complications ?Negative for: history of anesthetic complications ? ?Airway ?Mallampati: III ? ?TM Distance: >3 FB ?Neck ROM: full ? ? ? Dental ?no notable dental hx. ?(+) Teeth Intact ?  ?Pulmonary ?neg shortness of breath, asthma ,  ?  ?Pulmonary exam normal ?breath sounds clear to auscultation ? ? ? ? ? ? Cardiovascular ?Exercise Tolerance: Good ?(-) angina(-) Past MI and (-) DOE negative cardio ROS ?Normal cardiovascular exam ?Rhythm:Regular Rate:Normal ?- Systolic murmurs ? ?  ?Neuro/Psych ?negative neurological ROS ? negative psych ROS  ? GI/Hepatic ?negative GI ROS, Neg liver ROS, neg GERD  ,  ?Endo/Other  ?Hypothyroidism  ? Renal/GU ?  ? ?  ?Musculoskeletal ? ? Abdominal ?  ?Peds ? Hematology ?negative hematology ROS ?(+)   ?Anesthesia Other Findings ?Past Medical History: ?No date: Asthma ?No date: Heel spur ?No date: HLD (hyperlipidemia) ?No date: Hypothyroid ?No date: Osteopenia ?    Comment:  stress fracture in foot ?No date: Plantar fasciitis ?No date: Stress fracture of foot ? ?Past Surgical History: ?No date: COLONOSCOPY ?No date: WISDOM TOOTH EXTRACTION ? ?BMI   ? Body Mass Index: 25.53 kg/m?  ?  ? ? Reproductive/Obstetrics ?negative OB ROS ? ?  ? ? ? ? ? ? ? ? ? ? ? ? ? ?  ?  ? ? ? ? ? ? ? ? ?Anesthesia Physical ? ?Anesthesia Plan ? ?ASA: 2 ? ?Anesthesia Plan: General  ? ?Post-op Pain Management: GA combined w/ Regional for post-op pain and Ofirmev IV (intra-op)*, Regional block* and Toradol IV (intra-op)*  ? ?Induction: Intravenous ? ?PONV Risk Score and Plan: 3 and Dexamethasone, Ondansetron, Midazolam and Treatment may vary due to age or medical condition ? ?Airway Management Planned: LMA ? ?Additional Equipment: None ? ?Intra-op Plan:   ? ?Post-operative Plan: Extubation in OR ? ?Informed Consent: I have reviewed the patients History and Physical, chart, labs and discussed the procedure including the risks, benefits and alternatives for the proposed anesthesia with the patient or authorized representative who has indicated his/her understanding and acceptance.  ? ? ? ?Dental Advisory Given ? ?Plan Discussed with: Anesthesiologist, CRNA and Surgeon ? ?Anesthesia Plan Comments: (Discussed risks of anesthesia with patient, including PONV, sore throat, lip/dental/eye damage. Rare risks discussed as well, such as cardiorespiratory and neurological sequelae, and allergic reactions. Discussed the role of CRNA in patient's perioperative care. Patient understands. ?Patient had an adductor last year for similar surgery. She had severe pain when the block wore off, which lasted roughly 12 hours. ? ?Discussed r/b/a of possible postoperative adductor canal nerve block, including:  ?- bleeding, infection, nerve damage ?- poor or non functioning block. ?- reactions and toxicity to local anesthetic ?Patient understands and agrees ?)  ? ? ? ? ? ? ?Anesthesia Quick Evaluation ? ?

## 2021-07-12 NOTE — Transfer of Care (Signed)
Immediate Anesthesia Transfer of Care Note ? ?Patient: Katrina [name] ? ?Procedure(s) Performed: Right knee arthroscopic medial meniscus root repair (Right: Knee) ?CHONDROPLASTY (Right: Knee) ? ?Patient Location: PACU ? ?Anesthesia Type:General ? ?Level of Consciousness: awake and alert  ? ?Airway & Oxygen Therapy: Patient Spontanous Breathing and Patient connected to face mask oxygen ? ?Post-op Assessment: Report given to RN and Post -op Vital signs reviewed and stable ? ?Post vital signs: Reviewed and stable ? ?Last Vitals:  ?Vitals Value Taken Time  ?BP 123/87 07/12/21 1302  ?Temp 36.3 ?C 07/12/21 1302  ?Pulse 57 07/12/21 1305  ?Resp 8 07/12/21 1305  ?SpO2 100 % 07/12/21 1305  ?Vitals shown include unvalidated device data. ? ?Last Pain:  ?Vitals:  ? 07/12/21 1302  ?TempSrc:   ?PainSc: 7   ?   ? ?  ? ?Complications: No notable events documented. ?

## 2021-07-12 NOTE — Anesthesia Procedure Notes (Signed)
Procedure Name: LMA Insertion ?Date/Time: 07/12/2021 11:50 AM ?Performed by: Fredderick Phenix, CRNA ?Pre-anesthesia Checklist: Patient identified, Emergency Drugs available, Suction available and Patient being monitored ?Patient Re-evaluated:Patient Re-evaluated prior to induction ?Oxygen Delivery Method: Circle system utilized ?Preoxygenation: Pre-oxygenation with 100% oxygen ?Induction Type: IV induction ?Ventilation: Mask ventilation without difficulty ?LMA: LMA inserted ?LMA Size: 4.0 ?Tube type: Oral ?Number of attempts: 1 ?Airway Equipment and Method: Oral airway ?Placement Confirmation: positive ETCO2 and breath sounds checked- equal and bilateral ?Tube secured with: Tape ?Dental Injury: Teeth and Oropharynx as per pre-operative assessment  ? ? ? ? ?

## 2021-07-12 NOTE — Op Note (Addendum)
DATE: 07/12/2021 ?  ?PRE-OP DIAGNOSIS:  ?1. Right medial meniscus root tear ?2. Right Medial compartment and patellofemoral cdegenerative changes ?  ?POST-OP DIAGNOSIS:  ?1. Right medial meniscus root tear ?2. Right Medial compartment and patellofemoral degenerative changes ? ?PROCEDURES:  ?1. Right knee medial meniscus root repair  ?2. Right patella chondroplasty  ?  ?SURGEON:  ?Langston Reusing, MD ?  ?ASSISTANT(S):  ?Reche Dixon, [place]; Turner Daniels, PA-S  ?  ?ANESTHESIA: regional adductor canal block + general ?  ?TOTAL IV FLUIDS: See anesthesia record ?  ?ESTIMATED BLOOD LOSS: 10cc ?  ?TOURNIQUET TIME:  51 min ?  ?DRAINS:  None. ?  ?SPECIMENS: None ?  ?IMPLANTS:  ?- Arthrex 4.18m PEEK SwiveLock (x1) ?  ?  ?COMPLICATIONS: None ?  ?INDICATIONS: ?Katrina [name] a 71y.o. female with L knee pain that has failed non-operative management. Clinical exam and radiographic studies were notable for right knee pain and swelling with instances of giving way with associated popping and catching on the medial aspect of her knee. Additionally, MRI showed a medial meniscus root tear with mild degenerative changes to medial and patellofemoral compartments. Given the poor long-term prognosis of a meniscus root tear and high likelihood of significant progression of osteoarthritis, we elected to proceed with the above procedure after a discussion of the risks, benefits, and alternatives to surgery.  ?  ?OPERATIVE FINDINGS:  ?  ?Examination under anesthesia: A careful examination under anesthesia was performed.  Passive range of motion was: Hyperextension: 1.  Extension: 0.  Flexion: 130.  Lachman: normal. Pivot Shift: normal.  Posterior drawer: normal.  Varus stability in full extension: normal.  Varus stability in 30 degrees of flexion: normal.  Valgus stability in full extension: normal.  Valgus stability in 30 degrees of flexion: normal. ?  ?Intra-operative findings: A thorough arthroscopic examination of the knee was  performed.  The findings are: ?1. Suprapatellar pouch: Normal ?2. Undersurface of median ridge: Extension of medial facet degenerative changes, grade 3-4 centrally ?3. Medial patellar facet: Grade 4 degenerative changes centrally ?4. Lateral patellar facet: Extension of medial facet degenerative changes centrally, grade 3 ?5. Trochlea: Grade 1 degenerative changes ?6. Lateral gutter/popliteus tendon: Normal ?7. Hoffa's fat pad: Inflamed ?8. Medial gutter/plica: Normal ?9. ACL: Normal ?10. PCL: Normal ?11. Medial meniscus: Complete tear of the medial meniscus at the posterior root ?12. Medial compartment cartilage: areas of Grade 2-3 degenerative changes on MFC, grade 1-2 changes on tibial plateau ?13. Lateral meniscus: Normal ?14. Lateral compartment cartilage: Grade 1 changes to tibial plateau; normal femoral condyle ? ?DESCRIPTION OF PROCEDURE: ?I identified Katrina [name] the pre-operative holding area.  I marked the operative knee with my initials. I reviewed the risks and benefits of the proposed surgical intervention and the patient (and/or patient's guardian) wished to proceed. The patient was transferred to the operative suite and placed in the supine position with all bony prominences padded.  Anesthesia was administered. Appropriate IV antibiotics were administered prior to incision. The extremity was then prepped and draped in standard fashion. A time out was performed confirming the correct extremity, correct patient, and correct procedure. ?  ?Arthroscopy portals were marked. Local anesthetic was injected to the planned portal sites. The anterolateral portal was established with an 11 blade. The arthroscope was placed in the anterolateral portal and then into the suprapatellar pouch.  A diagnostic knee scope was completed with the above findings. Next the medial portal was established under needle localization. A shaver was used  to remove the multiple loose bodies in the medial gutter.  The MCL was  pie-crusted to improve visualization of the posterior horn. The meniscus root tear was identified and probed to confirm our findings. Next, an Arthrex meniscus root aiming guide was used to mark out the tibial incision. An approximately 3cm vertical incision was made medial to the tibial tubercle. This was carried down to the sartorius fascia with bovie electrocautery, and the fascia was incised. An elevator was used to clear periosteum from the tibia in the area of the anticipated bone tunnel. Hemostasis was achieved. The guide was reinserted into the tibia and was placed over the anatomic footprint of the medial meniscus root. We then used a 6.3m FlipCutter to drill into the tibia and create a 645msocket for the meniscus root. A FiberStick was passed through our tibial tunnel and into the joint. This was retrieved and passed through the anterolateral portal.  ? ?Next, using a Meniscus Scorpion, an 0-FiberLink suture was passed just medial to the meniscus root in a luggage tag configuration.  A second stitch was passed in a similar fashion medial to the first stitch.  This allowed for excellent purchase of the meniscus root.  We ensured there was no soft tissue bridge between the sutures and pulled the passing stitch from the [employer] [place] the anteromedial portal. We then used the passing stitch to bring the sutures in the meniscus out through the tibial tunnel. These were then passed through an [employer]chor. Anchor hole was drilled ~2cm distal previously drilled tibial tunnel. Anchor was inserted with appropriate tension while visualizing the repair with the arthroscope, and this achieved excellent interference fit. The meniscus root was probed and found to be stable.  Finally, a chondroplasty of the patella was performed using an oscillating shaver such that there were stable cartilaginous edges. ? ?Any loose bony debris was removed from the knee joint with a shaver and excess fluid was  evacuated from the joint. Closure of the portals with 3-0 Nylon was performed.  The subdermal layer of the tibial incision was closed with 2-0 vicryl and the skin was closed with 4-0 Monocryl in a running fashion and Dermabond.  Xeroform gauze and dry sterile dressings were applied. A PolarCare and hinged knee brace were also applied.  ? ?Instrument, sponge, and needle counts were correct prior to wound closure and at the conclusion of the case.  ? ?Of note, assistance from a PA was essential to performing the surgery.  PA was present for the entire surgery.  PA assisted with patient positioning, retraction, instrumentation, and wound closure. The surgery would have been more difficult and had longer operative time without PA assistance.  ? ?Additionally, this case had increased complexity compared to standard meniscus repair given that the patient had a complete tear of the meniscus root. Repair of this tear involved making a separate open incision, drilling a tunnel through the tibia, and using an implant in the tibia for fixation, all of which would otherwise not occur for standard meniscal repairs.  The steps increased surgical time by approximately 20 minutes. ? ?DISPOSITION: PACU - hemodynamically stable.  ?  ?POSTOPERATIVE PLAN: ?The patient will be discharged home today.   ?  ?Non-weight bearing x 4 weeks. 50% WB from weeks 4-6. ASA for DVT ppx x 4 weeks, Narcotic medication, NSAID, and acetaminophen as discussed pre-operatively. The patient will be attending physical therapy beginning 3-4 days post-op. Physical therapy per Meniscus Root Repair Rehab Guidelines. ?  ?  Patient to return to clinic 10-14 days postop for suture removal.   ? ?

## 2021-07-13 NOTE — Anesthesia Postprocedure Evaluation (Signed)
Anesthesia Post Note ? ?Patient: Katrina [name] ? ?Procedure(s) Performed: Right knee arthroscopic medial meniscus root repair (Right: Knee) ?CHONDROPLASTY (Right: Knee) ? ?Patient location during evaluation: PACU ?Anesthesia Type: General ?Level of consciousness: awake and alert ?Pain management: pain level controlled ?Vital Signs Assessment: post-procedure vital signs reviewed and stable ?Respiratory status: spontaneous breathing, nonlabored ventilation, respiratory function stable and patient connected to nasal cannula oxygen ?Cardiovascular status: blood pressure returned to baseline and stable ?Postop Assessment: no apparent nausea or vomiting ?Anesthetic complications: no ? ? ?No notable events documented. ? ? ?Last Vitals:  ?Vitals:  ? 07/12/21 1414 07/12/21 1426  ?BP: 115/74 129/77  ?Pulse: (!) 57 72  ?Resp: 13 18  ?Temp: 36.4 ?C (!) 36.1 ?C  ?SpO2: 99% 100%  ?  ?Last Pain:  ?Vitals:  ? 07/12/21 1426  ?TempSrc: Tympanic  ?PainSc: 0-No pain  ? ? ?  ?  ?  ?  ?  ?  ? ?Arita Miss ? ? ? ? ?

## 2021-07-15 ENCOUNTER — Other Ambulatory Visit: Payer: Self-pay

## 2021-07-15 ENCOUNTER — Other Ambulatory Visit: Payer: Self-pay | Admitting: Family Medicine

## 2021-07-15 ENCOUNTER — Encounter: Payer: Self-pay | Admitting: Orthopedic Surgery

## 2021-07-16 DIAGNOSIS — Z9889 Other specified postprocedural states: Secondary | ICD-10-CM | POA: Diagnosis not present

## 2021-07-16 DIAGNOSIS — R29898 Other symptoms and signs involving the musculoskeletal system: Secondary | ICD-10-CM | POA: Diagnosis not present

## 2021-07-16 DIAGNOSIS — M25661 Stiffness of right knee, not elsewhere classified: Secondary | ICD-10-CM | POA: Diagnosis not present

## 2021-07-16 DIAGNOSIS — M25561 Pain in right knee: Secondary | ICD-10-CM | POA: Diagnosis not present

## 2021-07-24 DIAGNOSIS — M25561 Pain in right knee: Secondary | ICD-10-CM | POA: Diagnosis not present

## 2021-07-24 DIAGNOSIS — Z9889 Other specified postprocedural states: Secondary | ICD-10-CM | POA: Diagnosis not present

## 2021-08-07 DIAGNOSIS — Z9889 Other specified postprocedural states: Secondary | ICD-10-CM | POA: Diagnosis not present

## 2021-08-07 DIAGNOSIS — M25661 Stiffness of right knee, not elsewhere classified: Secondary | ICD-10-CM | POA: Diagnosis not present

## 2021-08-21 DIAGNOSIS — M25561 Pain in right knee: Secondary | ICD-10-CM | POA: Diagnosis not present

## 2021-08-21 DIAGNOSIS — Z9889 Other specified postprocedural states: Secondary | ICD-10-CM | POA: Diagnosis not present

## 2021-08-28 DIAGNOSIS — Z9889 Other specified postprocedural states: Secondary | ICD-10-CM | POA: Diagnosis not present

## 2021-08-28 DIAGNOSIS — M25561 Pain in right knee: Secondary | ICD-10-CM | POA: Diagnosis not present

## 2021-09-04 DIAGNOSIS — M25561 Pain in right knee: Secondary | ICD-10-CM | POA: Diagnosis not present

## 2021-09-04 DIAGNOSIS — Z9889 Other specified postprocedural states: Secondary | ICD-10-CM | POA: Diagnosis not present

## 2021-09-09 DIAGNOSIS — Z9889 Other specified postprocedural states: Secondary | ICD-10-CM | POA: Diagnosis not present

## 2021-09-09 DIAGNOSIS — M25561 Pain in right knee: Secondary | ICD-10-CM | POA: Diagnosis not present

## 2021-09-12 DIAGNOSIS — M25561 Pain in right knee: Secondary | ICD-10-CM | POA: Diagnosis not present

## 2021-09-12 DIAGNOSIS — Z9889 Other specified postprocedural states: Secondary | ICD-10-CM | POA: Diagnosis not present

## 2021-09-25 DIAGNOSIS — Z9889 Other specified postprocedural states: Secondary | ICD-10-CM | POA: Diagnosis not present

## 2021-09-25 DIAGNOSIS — M25561 Pain in right knee: Secondary | ICD-10-CM | POA: Diagnosis not present

## 2021-09-30 DIAGNOSIS — M25561 Pain in right knee: Secondary | ICD-10-CM | POA: Diagnosis not present

## 2021-09-30 DIAGNOSIS — Z9889 Other specified postprocedural states: Secondary | ICD-10-CM | POA: Diagnosis not present

## 2021-10-03 DIAGNOSIS — Z9889 Other specified postprocedural states: Secondary | ICD-10-CM | POA: Diagnosis not present

## 2021-10-06 ENCOUNTER — Other Ambulatory Visit: Payer: Self-pay | Admitting: Family Medicine

## 2021-10-11 DIAGNOSIS — M25561 Pain in right knee: Secondary | ICD-10-CM | POA: Diagnosis not present

## 2021-10-11 DIAGNOSIS — Z9889 Other specified postprocedural states: Secondary | ICD-10-CM | POA: Diagnosis not present

## 2021-10-16 DIAGNOSIS — M25511 Pain in right shoulder: Secondary | ICD-10-CM | POA: Diagnosis not present

## 2021-10-16 DIAGNOSIS — S83242D Other tear of medial meniscus, current injury, left knee, subsequent encounter: Secondary | ICD-10-CM | POA: Diagnosis not present

## 2021-10-16 DIAGNOSIS — Z9889 Other specified postprocedural states: Secondary | ICD-10-CM | POA: Diagnosis not present

## 2021-10-24 ENCOUNTER — Other Ambulatory Visit: Payer: Self-pay | Admitting: Family Medicine

## 2021-12-18 DIAGNOSIS — M25511 Pain in right shoulder: Secondary | ICD-10-CM | POA: Diagnosis not present

## 2021-12-18 DIAGNOSIS — S83241A Other tear of medial meniscus, current injury, right knee, initial encounter: Secondary | ICD-10-CM | POA: Diagnosis not present

## 2021-12-29 ENCOUNTER — Telehealth: Payer: Self-pay | Admitting: Family Medicine

## 2021-12-29 DIAGNOSIS — E7800 Pure hypercholesterolemia, unspecified: Secondary | ICD-10-CM

## 2021-12-29 DIAGNOSIS — Z0000 Encounter for general adult medical examination without abnormal findings: Secondary | ICD-10-CM

## 2021-12-29 DIAGNOSIS — E039 Hypothyroidism, unspecified: Secondary | ICD-10-CM

## 2021-12-29 NOTE — Telephone Encounter (Signed)
-----   Message from Ellamae Sia sent at 12/17/2021  3:36 PM EDT ----- Regarding: Lab orders for Monday, 9.25.23 Patient is scheduled for CPX labs, please order future labs, Thanks , Karna Christmas

## 2021-12-30 ENCOUNTER — Other Ambulatory Visit (INDEPENDENT_AMBULATORY_CARE_PROVIDER_SITE_OTHER): Payer: Medicare PPO

## 2021-12-30 DIAGNOSIS — Z0000 Encounter for general adult medical examination without abnormal findings: Secondary | ICD-10-CM

## 2021-12-30 DIAGNOSIS — E7800 Pure hypercholesterolemia, unspecified: Secondary | ICD-10-CM | POA: Diagnosis not present

## 2021-12-30 DIAGNOSIS — E039 Hypothyroidism, unspecified: Secondary | ICD-10-CM

## 2021-12-30 LAB — COMPREHENSIVE METABOLIC PANEL
ALT: 14 U/L (ref 0–35)
AST: 16 U/L (ref 0–37)
Albumin: 4 g/dL (ref 3.5–5.2)
Alkaline Phosphatase: 80 U/L (ref 39–117)
BUN: 13 mg/dL (ref 6–23)
CO2: 28 mEq/L (ref 19–32)
Calcium: 9.6 mg/dL (ref 8.4–10.5)
Chloride: 102 mEq/L (ref 96–112)
Creatinine, Ser: 0.9 mg/dL (ref 0.40–1.20)
GFR: 65.29 mL/min (ref >60.00)
Glucose, Bld: 96 mg/dL (ref 70–99)
Potassium: 4.9 mEq/L (ref 3.5–5.1)
Sodium: 139 mEq/L (ref 135–145)
Total Bilirubin: 0.9 mg/dL (ref 0.2–1.2)
Total Protein: 6.9 g/dL (ref 6.0–8.3)

## 2021-12-30 LAB — CBC WITH DIFFERENTIAL/PLATELET
Basophils Absolute: 0 10*3/uL (ref 0.0–0.1)
Basophils Relative: 0.9 % (ref 0.0–3.0)
Eosinophils Absolute: 0.1 10*3/uL (ref 0.0–0.7)
Eosinophils Relative: 1.5 % (ref 0.0–5.0)
HCT: 42.5 % (ref 36.0–46.0)
Hemoglobin: 14.3 g/dL (ref 12.0–15.0)
Lymphocytes Relative: 41.5 % (ref 12.0–46.0)
Lymphs Abs: 2.2 10*3/uL (ref 0.7–4.0)
MCHC: 33.6 g/dL (ref 30.0–36.0)
MCV: 94 fl (ref 78.0–100.0)
Monocytes Absolute: 0.6 10*3/uL (ref 0.1–1.0)
Monocytes Relative: 11.3 % (ref 3.0–12.0)
Neutro Abs: 2.4 10*3/uL (ref 1.4–7.7)
Neutrophils Relative %: 44.8 % (ref 43.0–77.0)
Platelets: 278 10*3/uL (ref 150.0–400.0)
RBC: 4.53 Mil/uL (ref 3.87–5.11)
RDW: 12.5 % (ref 11.5–15.5)
WBC: 5.3 10*3/uL (ref 4.0–10.5)

## 2021-12-30 LAB — LIPID PANEL
Cholesterol: 217 mg/dL — ABNORMAL HIGH (ref 0–200)
HDL: 60.2 mg/dL (ref >39.00)
LDL Cholesterol: 128 mg/dL — ABNORMAL HIGH (ref 0–99)
NonHDL: 156.93
Total CHOL/HDL Ratio: 4
Triglycerides: 145 mg/dL (ref 0.0–149.0)
VLDL: 29 mg/dL (ref 0.0–40.0)

## 2021-12-30 LAB — TSH: TSH: 4.2 u[IU]/mL (ref 0.35–5.50)

## 2022-01-06 ENCOUNTER — Encounter: Payer: Self-pay | Admitting: Family Medicine

## 2022-01-06 ENCOUNTER — Ambulatory Visit (INDEPENDENT_AMBULATORY_CARE_PROVIDER_SITE_OTHER): Payer: Medicare PPO | Admitting: Family Medicine

## 2022-01-06 VITALS — BP 110/70 | HR 80 | Temp 97.9°F | Ht 65.75 in | Wt 164.8 lb

## 2022-01-06 DIAGNOSIS — Z23 Encounter for immunization: Secondary | ICD-10-CM | POA: Diagnosis not present

## 2022-01-06 DIAGNOSIS — Z0000 Encounter for general adult medical examination without abnormal findings: Secondary | ICD-10-CM | POA: Diagnosis not present

## 2022-01-06 DIAGNOSIS — E7800 Pure hypercholesterolemia, unspecified: Secondary | ICD-10-CM | POA: Diagnosis not present

## 2022-01-06 DIAGNOSIS — Z1211 Encounter for screening for malignant neoplasm of colon: Secondary | ICD-10-CM

## 2022-01-06 DIAGNOSIS — Z1231 Encounter for screening mammogram for malignant neoplasm of breast: Secondary | ICD-10-CM | POA: Insufficient documentation

## 2022-01-06 DIAGNOSIS — M8580 Other specified disorders of bone density and structure, unspecified site: Secondary | ICD-10-CM | POA: Diagnosis not present

## 2022-01-06 DIAGNOSIS — E2839 Other primary ovarian failure: Secondary | ICD-10-CM

## 2022-01-06 DIAGNOSIS — E039 Hypothyroidism, unspecified: Secondary | ICD-10-CM

## 2022-01-06 MED ORDER — LEVOTHYROXINE SODIUM 88 MCG PO TABS: 88.0000 ug | ORAL_TABLET | Freq: Every day | ORAL | 3 refills | Status: DC

## 2022-01-06 MED ORDER — EZETIMIBE 10 MG PO TABS: 10.0000 mg | ORAL_TABLET | Freq: Every day | ORAL | 3 refills | Status: DC

## 2022-01-06 NOTE — Assessment & Plan Note (Signed)
Disc goals for lipids and reasons to control them Rev last labs with pt Rev low sat fat diet in detail  Intol of statin/crestor  Taking zetia  LDL of 128 currently with good HDL and trig

## 2022-01-06 NOTE — Assessment & Plan Note (Addendum)
Hypothyroidism  Pt has no clinical changes No change in energy level/ hair or skin/ edema and no tremor Lab Results  Component Value Date   TSH 4.20 12/30/2021    Taking levothyroxine 88 mcg daily /takes correctly  No changes

## 2022-01-06 NOTE — Assessment & Plan Note (Signed)
Reviewed health habits including diet and exercise and skin cancer prevention Reviewed appropriate screening tests for age  Also reviewed health mt list, fam hx and immunization status , as well as social and family history   See HPI Labs reviewed  Plans to get shingrix at pharmacy  Flu shot given today  Tetanus shot postponed for ins  Mammogram and dexa ordered-pt will call to schedule Colonoscopy utd 02/2018  Good exercise  Good ca and D intake

## 2022-01-06 NOTE — Progress Notes (Signed)
Subjective:    Patient ID: Katrina [name], female    DOB: [birth year], 71 y.o.   MRN: [id number]  HPI Here for health maintenance exam and to review chronic medical problems    Wt Readings from Last 3 Encounters:  01/06/22 164 lb 12.8 oz (74.8 kg)  07/12/21 165 lb (74.8 kg)  07/09/21 165 lb (74.8 kg)   26.80 kg/m  Feeling well  Lot of stress  Best friend had cancer, is doing a lot better  Keeps grandkids Mother has dementia   She had knee surgery again- meniscal tear  Starting to feel like her gait is getting more normal Back to golf Getting back to the gym     Immunization History  Administered Date(s) Administered   Fluad Quad(high Dose 65+) 12/29/2019, 01/03/2021   Influenza Whole 01/29/2007   Influenza, High Dose Seasonal PF 02/05/2018   Influenza,inj,Quad PF,6+ Mos 11/25/2018   Influenza-Unspecified 02/18/2013, 02/06/2014, 02/06/2016   Pneumococcal Conjugate-13 11/23/2017   Pneumococcal Polysaccharide-23 11/25/2018   Td 04/07/2001   Tdap 08/15/2011   Health Maintenance Due  Topic Date Due   COVID-19 Vaccine (1) Never done   Zoster Vaccines- Shingrix (1 of 2) Never done   TETANUS/TDAP  08/14/2021   MAMMOGRAM  11/02/2021   INFLUENZA VACCINE  11/05/2021   Shingrix: interested /has to check on coverage   Flu shot: today   Tdap 2013  Mammogram 10/2020- needs to schedule it  Self breast exam: no lumps or changes   Colonoscopy 02/2018 10 y recall   Dexa  05/2018  Falls-none  Fractures-none new  Knee fracture remotely Supplements : takes ca and D  Exercise : getting back to the gym   BP Readings from Last 3 Encounters:  01/06/22 110/70  07/12/21 129/77  01/03/21 120/80   Pulse Readings from Last 3 Encounters:  01/06/22 80  07/12/21 72  01/03/21 (!) 58    Hypothyroidism  Pt has no clinical changes No change in energy level/ hair or skin/ edema and no tremor Lab Results  Component Value Date   TSH 4.20 12/30/2021     Takes levothyroxine  88 mcg daily   Hyperlipidemia   Lab Results  Component Value Date   CHOL 217 (H) 12/30/2021   CHOL 220 (H) 01/03/2021   CHOL 198 02/20/2020   Lab Results  Component Value Date   HDL 60.20 12/30/2021   HDL 65.90 01/03/2021   HDL 61.70 02/20/2020   Lab Results  Component Value Date   LDLCALC 128 (H) 12/30/2021   LDLCALC 126 (H) 01/03/2021   LDLCALC 109 (H) 02/20/2020   Lab Results  Component Value Date   TRIG 145.0 12/30/2021   TRIG 143.0 01/03/2021   TRIG 136.0 02/20/2020   Lab Results  Component Value Date   CHOLHDL 4 12/30/2021   CHOLHDL 3 01/03/2021   CHOLHDL 3 02/20/2020   Lab Results  Component Value Date   LDLDIRECT 184.1 03/28/2013   LDLDIRECT 181.2 08/15/2011   LDLDIRECT 177.6 04/02/2010   Could not tolerate crestor-muscle pain  Taking zetia 10 mg daily   The 10-year ASCVD risk score (Arnett DK, et al., 2019) is: 6.5%   Values used to calculate the score:     Age: 36 years     Sex: Female     Is Non-Hispanic African American: No     Diabetic: No     Tobacco smoker: No     Systolic Blood Pressure: 811 mmHg     Is BP treated:  No     HDL Cholesterol: 60.2 mg/dL     Total Cholesterol: 217 mg/dL   Lab Results  Component Value Date   CREATININE 0.90 12/30/2021   BUN 13 12/30/2021   NA 139 12/30/2021   K 4.9 12/30/2021   CL 102 12/30/2021   CO2 28 12/30/2021   Lab Results  Component Value Date   ALT 14 12/30/2021   AST 16 12/30/2021   ALKPHOS 80 12/30/2021   BILITOT 0.9 12/30/2021    Lab Results  Component Value Date   WBC 5.3 12/30/2021   HGB 14.3 12/30/2021   HCT 42.5 12/30/2021   MCV 94.0 12/30/2021   PLT 278.0 12/30/2021   Patient Active Problem List   Diagnosis Date Noted   Encounter for screening mammogram for breast cancer 01/06/2022   Medicare annual wellness visit, subsequent 11/25/2018   Welcome to Medicare preventive visit 11/23/2017   Encounter for routine gynecological examination 09/28/2015   Estrogen deficiency  09/28/2015   Colon cancer screening 03/28/2013   Routine general medical examination at a health care facility 08/15/2011   Gynecological examination 08/15/2011   Post-menopausal 08/15/2011   Screening mammogram, encounter for 01/09/2011   Osteopenia 01/29/2007   Hypothyroidism 01/12/2007   Hyperlipidemia 01/12/2007   HEEL SPUR 01/12/2007   Past Medical History:  Diagnosis Date   Actinic keratosis    Asthma    Heel spur    HLD (hyperlipidemia)    Hypothyroid    Osteopenia    stress fracture in foot   Plantar fasciitis    Stress fracture of foot    Past Surgical History:  Procedure Laterality Date   CHONDROPLASTY Right 07/12/2021   Procedure: CHONDROPLASTY;  Surgeon: Leim Fabry, MD;  Location: [facility];  Service: Orthopedics;  Laterality: Right;   COLONOSCOPY  2020   KNEE ARTHROSCOPY WITH MEDIAL MENISECTOMY Right 07/12/2021   Procedure: Right knee arthroscopic medial meniscus root repair;  Surgeon: Leim Fabry, MD;  Location: [facility];  Service: Orthopedics;  Laterality: Right;   KNEE ARTHROSCOPY WITH MENISCAL REPAIR Left 05/02/2020   Procedure: Left medial meniscus root repair and subchondroplasty of medial plateau with possible chondroplasty of the patella - Reche Dixon to Assist;  Surgeon: Leim Fabry, MD;  Location: [facility];  Service: Orthopedics;  Laterality: Left;   WISDOM TOOTH EXTRACTION     Social History   Tobacco Use   Smoking status: Never   Smokeless tobacco: Never  Vaping Use   Vaping Use: Never used  Substance Use Topics   Alcohol use: Yes    Comment: rarely   Drug use: No   Family History  Problem Relation Age of Onset   Myelodysplastic syndrome Father    Hypertension Mother    Thyroid disease Mother    Cancer Other        GM--gallbladder   Lung cancer Other        GF   Colon cancer Other        GF   Osteoporosis Other        GM   Breast cancer Neg Hx    Allergies  Allergen Reactions   Advil [Ibuprofen]     Not an allergy but "feels awful  the next day, lethargic." Still takes it from time-to-time.   Clindamycin/Lincomycin Other (See Comments)    Caused C-diff   Codeine Nausea And Vomiting   Crestor [Rosuvastatin]     Muscle pain   Current Outpatient Medications on File Prior to Visit  Medication Sig Dispense  Refill   Cholecalciferol (VITAMIN D-3) 125 MCG (5000 UT) TABS Take 5,000 Units by mouth daily with lunch.     albuterol (PROVENTIL HFA;VENTOLIN HFA) 108 (90 Base) MCG/ACT inhaler Inhale 2 puffs into the lungs every 4 (four) hours as needed for wheezing. 1 Inhaler 5   Calcium-Magnesium-Zinc (CAL-MAG-ZINC PO) Take 1 tablet by mouth daily with lunch.     fluticasone (FLONASE) 50 MCG/ACT nasal spray Place 1 spray into both nostrils daily.     Multiple Vitamin (MULTIVITAMIN WITH MINERALS) TABS tablet Take 1 tablet by mouth daily with lunch.     vitamin C (ASCORBIC ACID) 500 MG tablet Take 500 mg by mouth daily with lunch.     No current facility-administered medications on file prior to visit.     Review of Systems  Constitutional:  Negative for activity change, appetite change, fatigue, fever and unexpected weight change.  HENT:  Negative for congestion, ear pain, rhinorrhea, sinus pressure and sore throat.   Eyes:  Negative for pain, redness and visual disturbance.  Respiratory:  Negative for cough, shortness of breath and wheezing.   Cardiovascular:  Negative for chest pain and palpitations.  Gastrointestinal:  Negative for abdominal pain, blood in stool, constipation and diarrhea.  Endocrine: Negative for polydipsia and polyuria.  Genitourinary:  Negative for dysuria, frequency and urgency.  Musculoskeletal:  Negative for arthralgias, back pain and myalgias.  Skin:  Negative for pallor and rash.  Allergic/Immunologic: Negative for environmental allergies.  Neurological:  Negative for dizziness, syncope and headaches.  Hematological:  Negative for adenopathy. Does not bruise/bleed easily.  Psychiatric/Behavioral:   Negative for decreased concentration and dysphoric mood. The patient is not nervous/anxious.        Objective:   Physical Exam Constitutional:      General: She is not in acute distress.    Appearance: Normal appearance. She is well-developed and normal weight. She is not ill-appearing or diaphoretic.  HENT:     Head: Normocephalic and atraumatic.     Right Ear: Tympanic membrane, ear canal and external ear normal.     Left Ear: Tympanic membrane, ear canal and external ear normal.     Nose: Nose normal. No congestion.     Mouth/Throat:     Mouth: Mucous membranes are moist.     Pharynx: Oropharynx is clear. No posterior oropharyngeal erythema.  Eyes:     General: No scleral icterus.    Extraocular Movements: Extraocular movements intact.     Conjunctiva/sclera: Conjunctivae normal.     Pupils: Pupils are equal, round, and reactive to light.  Neck:     Thyroid: No thyromegaly.     Vascular: No carotid bruit or JVD.  Cardiovascular:     Rate and Rhythm: Normal rate and regular rhythm.     Pulses: Normal pulses.     Heart sounds: Normal heart sounds.     No gallop.  Pulmonary:     Effort: Pulmonary effort is normal. No respiratory distress.     Breath sounds: Normal breath sounds. No wheezing.     Comments: Good air exch Chest:     Chest wall: No tenderness.  Abdominal:     General: Bowel sounds are normal. There is no distension or abdominal bruit.     Palpations: Abdomen is soft. There is no mass.     Tenderness: There is no abdominal tenderness.     Hernia: No hernia is present.  Genitourinary:    Comments: Breast exam: No mass, nodules, thickening, tenderness, bulging,  retraction, inflamation, nipple discharge or skin changes noted.  No axillary or clavicular LA.     Musculoskeletal:        General: No tenderness. Normal range of motion.     Cervical back: Normal range of motion and neck supple. No rigidity. No muscular tenderness.     Right lower leg: No edema.      Left lower leg: No edema.     Comments: No kyphosis   Lymphadenopathy:     Cervical: No cervical adenopathy.  Skin:    General: Skin is warm and dry.     Coloration: Skin is not pale.     Findings: No erythema or rash.     Comments: Solar lentigines diffusely   Neurological:     Mental Status: She is alert. Mental status is at baseline.     Cranial Nerves: No cranial nerve deficit.     Motor: No abnormal muscle tone.     Coordination: Coordination normal.     Gait: Gait normal.     Deep Tendon Reflexes: Reflexes are normal and symmetric. Reflexes normal.  Psychiatric:        Mood and Affect: Mood normal.        Cognition and Memory: Cognition and memory normal.     Comments: Candidly discusses symptoms and stressors             Assessment & Plan:   Problem List Items Addressed This Visit       Endocrine   Hypothyroidism    Hypothyroidism  Pt has no clinical changes No change in energy level/ hair or skin/ edema and no tremor Lab Results  Component Value Date   TSH 4.20 12/30/2021    Taking levothyroxine 88 mcg daily /takes correctly  No changes        Relevant Medications   levothyroxine (SYNTHROID) 88 MCG tablet     Musculoskeletal and Integument   Osteopenia    dexa ordered for [place] She will call to schedule No falls or fx (since knee fx) Taking ca and D  Exercise- walking plus gym        Other   Colon cancer screening    Colonoscopy 02/2018 with 10 y recall       Encounter for screening mammogram for breast cancer    Mammogram ordered Pt will call to schedule       Relevant Orders   MM 3D SCREEN BREAST BILATERAL   Estrogen deficiency   Relevant Orders   DG Bone Density   Hyperlipidemia    Disc goals for lipids and reasons to control them Rev last labs with pt Rev low sat fat diet in detail  Intol of statin/crestor  Taking zetia  LDL of 128 currently with good HDL and trig        Relevant Medications   ezetimibe (ZETIA) 10  MG tablet   Routine general medical examination at a health care facility - Primary    Reviewed health habits including diet and exercise and skin cancer prevention Reviewed appropriate screening tests for age  Also reviewed health mt list, fam hx and immunization status , as well as social and family history   See HPI Labs reviewed  Plans to get shingrix at pharmacy  Flu shot given today  Tetanus shot postponed for ins  Mammogram and dexa ordered-pt will call to schedule Colonoscopy utd 02/2018  Good exercise  Good ca and D intake       Other Visit Diagnoses  Need for immunization against influenza       Relevant Orders   Flu Vaccine QUAD High Dose(Fluad) (Completed)

## 2022-01-06 NOTE — Assessment & Plan Note (Signed)
dexa ordered for Katrina [name] She will call to schedule No falls or fx (since knee fx) Taking ca and D  Exercise- walking plus gym

## 2022-01-06 NOTE — Assessment & Plan Note (Signed)
Colonoscopy 02/2018 with 10 y recall

## 2022-01-06 NOTE — Assessment & Plan Note (Signed)
Mammogram ordered °Pt will call to schedule  °

## 2022-01-06 NOTE — Patient Instructions (Addendum)
If you are interested in the new shingles vaccine (Shingrix) - call your local pharmacy to check on coverage and availability  If affordable, get on a wait list at your pharmacy to get the vaccine.  Call and schedule your mammogram and bone density test  at [facility]   Flu shot today

## 2022-01-15 ENCOUNTER — Ambulatory Visit: Payer: Medicare PPO

## 2022-01-15 ENCOUNTER — Telehealth: Payer: Self-pay

## 2022-01-15 NOTE — Telephone Encounter (Signed)
Unsuccessful attempt to reach patient on preferred number listed in notes for scheduled AWV. Voicemail full unable to leave message. 

## 2022-01-27 ENCOUNTER — Ambulatory Visit: Payer: Medicare HMO | Admitting: Dermatology

## 2022-02-24 ENCOUNTER — Telehealth: Payer: Self-pay | Admitting: Family Medicine

## 2022-02-24 NOTE — Telephone Encounter (Signed)
Left message for patient to schedule Annual Wellness Visit.  Please schedule with Nurse Health Advisor Denisa O'Brien-Blaney, LPN  This appt can be telephone visit.  Please call [phone] ask for [facility]

## 2022-03-07 DIAGNOSIS — B029 Zoster without complications: Secondary | ICD-10-CM | POA: Diagnosis not present

## 2022-03-07 DIAGNOSIS — L03116 Cellulitis of left lower limb: Secondary | ICD-10-CM | POA: Diagnosis not present

## 2022-04-07 DIAGNOSIS — R69 Illness, unspecified: Secondary | ICD-10-CM | POA: Diagnosis not present

## 2022-04-14 ENCOUNTER — Ambulatory Visit
Admission: RE | Admit: 2022-04-14 | Discharge: 2022-04-14 | Disposition: A | Discharge status: Home / Self Care | Payer: Medicare HMO | Source: Ambulatory Visit | Attending: Family Medicine | Admitting: Family Medicine

## 2022-04-14 DIAGNOSIS — E2839 Other primary ovarian failure: Secondary | ICD-10-CM | POA: Diagnosis not present

## 2022-04-14 DIAGNOSIS — Z1231 Encounter for screening mammogram for malignant neoplasm of breast: Secondary | ICD-10-CM

## 2022-04-14 DIAGNOSIS — M8589 Other specified disorders of bone density and structure, multiple sites: Secondary | ICD-10-CM | POA: Diagnosis not present

## 2022-04-14 DIAGNOSIS — Z780 Asymptomatic menopausal state: Secondary | ICD-10-CM | POA: Diagnosis not present

## 2022-05-01 ENCOUNTER — Ambulatory Visit (INDEPENDENT_AMBULATORY_CARE_PROVIDER_SITE_OTHER): Payer: Medicare HMO

## 2022-05-01 VITALS — Wt 164.0 lb

## 2022-05-01 DIAGNOSIS — Z0000 Encounter for general adult medical examination without abnormal findings: Secondary | ICD-10-CM | POA: Diagnosis not present

## 2022-05-01 NOTE — Progress Notes (Signed)
I connected with  [name] on 05/01/22 by a audio enabled telemedicine application and verified that I am speaking with the correct person using two identifiers.  Patient Location: Home  Provider Location: Office/Clinic  I discussed the limitations of evaluation and management by telemedicine. The patient expressed understanding and agreed to proceed.  Subjective:   Katrina [name] is a 72 y.o. female who presents for Medicare Annual (Subsequent) preventive examination.  Review of Systems     Cardiac Risk Factors include: advanced age (>46mn, >>68women);dyslipidemia     Objective:    Today's Vitals   05/01/22 0925  Weight: 164 lb (74.4 kg)   Body mass index is 26.67 kg/m.     05/01/2022    9:30 AM 07/12/2021   10:13 AM 07/09/2021   12:20 PM  Advanced Directives  Does Patient Have a Medical Advance Directive? Yes No No  Type of AParamedicof [place] will    Copy of [place] Chart? No - copy requested    Would patient like information on creating a medical advance directive?   No - Patient declined    Current Medications (verified) Outpatient Encounter Medications as of 05/01/2022  Medication Sig   albuterol (PROVENTIL HFA;VENTOLIN HFA) 108 (90 Base) MCG/ACT inhaler Inhale 2 puffs into the lungs every 4 (four) hours as needed for wheezing.   Calcium-Magnesium-Zinc (CAL-MAG-ZINC PO) Take 1 tablet by mouth daily with lunch.   Cholecalciferol (VITAMIN D-3) 125 MCG (5000 UT) TABS Take 5,000 Units by mouth daily with lunch.   ezetimibe (ZETIA) 10 MG tablet Take 1 tablet (10 mg total) by mouth at bedtime.   fluticasone (FLONASE) 50 MCG/ACT nasal spray Place 1 spray into both nostrils daily.   levothyroxine (SYNTHROID) 88 MCG tablet Take 1 tablet (88 mcg total) by mouth daily before breakfast.   Multiple Vitamin (MULTIVITAMIN WITH MINERALS) TABS tablet Take 1 tablet by mouth daily with lunch.   vitamin C (ASCORBIC ACID)  500 MG tablet Take 500 mg by mouth daily with lunch.   No facility-administered encounter medications on file as of 05/01/2022.    Allergies (verified) Advil [ibuprofen], Clindamycin/lincomycin, Codeine, and Crestor [rosuvastatin]   History: Past Medical History:  Diagnosis Date   Actinic keratosis    Asthma    Heel spur    HLD (hyperlipidemia)    Hypothyroid    Osteopenia    stress fracture in foot   Plantar fasciitis    Stress fracture of foot    Past Surgical History:  Procedure Laterality Date   CHONDROPLASTY Right 07/12/2021   Procedure: CHONDROPLASTY;  Surgeon: PLeim Fabry MD;  Location: [facility] ORS;  Service: Orthopedics;  Laterality: Right;   COLONOSCOPY  2020   KNEE ARTHROSCOPY WITH MEDIAL MENISECTOMY Right 07/12/2021   Procedure: Right knee arthroscopic medial meniscus root repair;  Surgeon: PLeim Fabry MD;  Location: [facility] ORS;  Service: Orthopedics;  Laterality: Right;   KNEE ARTHROSCOPY WITH MENISCAL REPAIR Left 05/02/2020   Procedure: Left medial meniscus root repair and subchondroplasty of medial plateau with possible chondroplasty of the patella - TReche Dixonto Assist;  Surgeon: PLeim Fabry MD;  Location: [facility] OR[facility];  Service: Orthopedics;  Laterality: Left;   WISDOM TOOTH EXTRACTION     Family History  Problem Relation Age of Onset   Myelodysplastic syndrome Father    Hypertension Mother    Thyroid disease Mother    Cancer Other        GM--gallbladder   Lung  cancer Other        GF   Colon cancer Other        GF   Osteoporosis Other        GM   Breast cancer Neg Hx    Social History   Socioeconomic History   Marital status: Married    Spouse name: Deidre Ala   Number of children: 4   Years of education: Not on file   Highest education level: Not on file  Occupational History   Occupation: [employer]  Tobacco Use   Smoking status: Never   Smokeless tobacco: Never  Vaping Use   Vaping Use: Never used  Substance and Sexual Activity    Alcohol use: Not Currently    Comment: rarely   Drug use: No   Sexual activity: Not on file  Other Topics Concern   Not on file  Social History Narrative   Married   4 children   [place]   Social Determinants of Health   Financial Resource Strain: Low Risk  (05/01/2022)   Overall Financial Resource Strain (CARDIA)    Difficulty of Paying Living Expenses: Not hard at all  Food Insecurity: No Food Insecurity (05/01/2022)   Hunger Vital Sign    Worried About Running Out of Food in the Last Year: Never true    [place] in the Last Year: Never true  Transportation Needs: No Transportation Needs (05/01/2022)   PRAPARE - Hydrologist (Medical): No    Lack of Transportation (Non-Medical): No  Physical Activity: Sufficiently Active (05/01/2022)   Exercise Vital Sign    Days of Exercise per Week: 3 days    Minutes of Exercise per Session: 90 min  Stress: No Stress Concern Present (05/01/2022)   [place]    Feeling of Stress : Not at all  Social Connections: Moderately Integrated (05/01/2022)   Social Connection and Isolation Panel [NHANES]    Frequency of Communication with Friends and Family: More than three times a week    Frequency of Social Gatherings with Friends and Family: More than three times a week    Attends Religious Services: More than 4 times per year    Active Member of [employer] or Organizations: No    Attends Music therapist: Never    Marital Status: Married    Tobacco Counseling Counseling given: Not Answered   Clinical Intake:  Pre-visit preparation completed: Yes  Pain : No/denies pain     BMI - recorded: 26.67 Nutritional Status: BMI 25 -29 Overweight Nutritional Risks: None Diabetes: No  How often do you need to have someone help you when you read instructions, pamphlets, or other written materials from your doctor or  pharmacy?: 1 - Never  Diabetic?no  Interpreter Needed?: No  Information entered by :: Charlott Rakes, LPN   Activities of Daily Living    05/01/2022    9:31 AM 07/09/2021   12:20 PM  In your present state of health, do you have any difficulty performing the following activities:  Hearing? 0 0  Vision? 0 0  Difficulty concentrating or making decisions? 0 0  Walking or climbing stairs? 0 0  Dressing or bathing? 0 0  Doing errands, shopping? 0 0  Preparing Food and eating ? N   Using the Toilet? N   In the past six months, have you accidently leaked urine? N   Do you have  problems with loss of bowel control? N   Managing your Medications? N   Managing your Finances? N   Housekeeping or managing your Housekeeping? N     Patient Care Team: Tower, Wynelle Fanny, MD as PCP - General  Indicate any recent Medical Services you may have received from other than [facility] providers in the past year (date may be approximate).     Assessment:   This is a routine wellness examination for [place].  Hearing/Vision screen Hearing Screening - Comments:: Pt denies any hearing issues  Vision Screening - Comments:: Pt follows up with Dr [facility] at [facility] [place]   Dietary issues and exercise activities discussed: Current Exercise Habits: Home exercise routine, Type of exercise: Other - see comments (classes), Time (Minutes): > 60, Frequency (Times/Week): 3, Weekly Exercise (Minutes/Week): 0   Goals Addressed             This Visit's Progress    Patient Stated       Lose weight        Depression Screen    05/01/2022    9:28 AM 01/06/2022    9:37 AM 01/03/2021    9:28 AM 12/29/2019   10:04 AM 11/25/2018   12:16 PM 11/23/2017   10:11 AM 10/07/2016   11:27 AM  PHQ 2/9 Scores  PHQ - 2 Score 0 0 0 0 0 0 0  PHQ- 9 Score  0     0    Fall Risk    05/01/2022    9:31 AM 01/06/2022    9:02 AM 01/03/2021    9:28 AM 12/29/2019    9:44 AM 11/25/2018   12:17 PM  Fall Risk   Falls in the past year? 0  0 0 0 0  Number falls in past yr: 0   0 0  Injury with Fall? 0   0 0  Risk for fall due to : Impaired vision      Follow up Falls prevention discussed    Falls evaluation completed    FALL RISK PREVENTION PERTAINING TO THE HOME:  Any stairs in or around the home? Yes  If so, are there any without handrails? No  Home free of loose throw rugs in walkways, pet beds, electrical cords, etc? Yes  Adequate lighting in your home to reduce risk of falls? Yes   ASSISTIVE DEVICES UTILIZED TO PREVENT FALLS:  Life alert? No  Use of a cane, walker or w/c? No  Grab bars in the bathroom? No  Shower chair or bench in shower? Yes  Elevated toilet seat or a handicapped toilet? No   TIMED UP AND GO:  Was the test performed? No .  Cognitive Function:        05/01/2022    9:32 AM  6CIT Screen  What Year? 0 points  What month? 0 points  What time? 0 points  Count back from 20 0 points  Months in reverse 0 points  Repeat phrase 0 points  Total Score 0 points    Immunizations Immunization History  Administered Date(s) Administered   Fluad Quad(high Dose 65+) 12/29/2019, 01/03/2021, 01/06/2022   Influenza Whole 01/29/2007   Influenza, High Dose Seasonal PF 02/05/2018   Influenza,inj,Quad PF,6+ Mos 11/25/2018   Influenza-Unspecified 02/18/2013, 02/06/2014, 02/06/2016   Pneumococcal Conjugate-13 11/23/2017   Pneumococcal Polysaccharide-23 11/25/2018   Td 04/07/2001   Tdap 08/15/2011    TDAP status: Due, Education has been provided regarding the importance of this vaccine. Advised may receive this  vaccine at local pharmacy or Health Dept. Aware to provide a copy of the vaccination record if obtained from local pharmacy or Health Dept. Verbalized acceptance and understanding.  Flu Vaccine status: Up to date  Pneumococcal vaccine status: Up to date  Covid-19 vaccine status: Information provided on how to obtain vaccines.   Qualifies for Shingles Vaccine? Yes   Zostavax completed No    Shingrix Completed?: No.    Education has been provided regarding the importance of this vaccine. Patient has been advised to call insurance company to determine out of pocket expense if they have not yet received this vaccine. Advised may also receive vaccine at local pharmacy or Health Dept. Verbalized acceptance and understanding.  Screening Tests Health Maintenance  Topic Date Due   COVID-19 Vaccine (1) Never done   Zoster Vaccines- Shingrix (1 of 2) Never done   DTaP/Tdap/Td (3 - Td or Tdap) 08/14/2021   Hepatitis C Screening  05/10/2023 (Originally 08/19/1968)   MAMMOGRAM  04/15/2023   Medicare Annual Wellness (AWV)  05/02/2023   COLONOSCOPY (Pts 45-11yr Insurance coverage will need to be confirmed)  02/10/2028   Pneumonia Vaccine 70 Years old  Completed   INFLUENZA VACCINE  Completed   DEXA SCAN  Completed   HPV VLeelanauMaintenance Due  Topic Date Due   COVID-19 Vaccine (1) Never done   Zoster Vaccines- Shingrix (1 of 2) Never done   DTaP/Tdap/Td (3 - Td or Tdap) 08/14/2021    Colorectal cancer screening: Type of screening: Colonoscopy. Completed 02/09/18. Repeat every 10 years  Mammogram status: Completed 04/14/22. Repeat every year  Bone Density status: Completed 04/14/22. Results reflect: Bone density results: OSTEOPENIA. Repeat every 2 years.   Additional Screening:  Hepatitis C Screening: does qualify;   Vision Screening: Recommended annual ophthalmology exams for early detection of glaucoma and other disorders of the eye. Is the patient up to date with their annual eye exam?  Yes  Who is the provider or what is the name of the office in which the patient attends annual eye exams? Dr [email or web address] [place]  If pt is not established with a provider, would they like to be referred to a provider to establish care? No .   Dental Screening: Recommended annual dental exams for proper oral hygiene  Community Resource  Referral / Chronic Care Management: CRR required this visit?  No   CCM required this visit?  No      Plan:     I have personally reviewed and noted the following in the patient's chart:   Medical and social history Use of alcohol, tobacco or illicit drugs  Current medications and supplements including opioid prescriptions. Patient is not currently taking opioid prescriptions. Functional ability and status Nutritional status Physical activity Advanced directives List of other physicians Hospitalizations, surgeries, and ER visits in previous 12 months Vitals Screenings to include cognitive, depression, and falls Referrals and appointments  In addition, I have reviewed and discussed with patient certain preventive protocols, quality metrics, and best practice recommendations. A written personalized care plan for preventive services as well as general preventive health recommendations were provided to patient.     TWillette Brace LPN   [id number]  Nurse Notes: none

## 2022-05-01 NOTE — Patient Instructions (Signed)
Ms. Katrina [name] , Thank you for taking time to come for your Medicare Wellness Visit. I appreciate your ongoing commitment to your health goals. Please review the following plan we discussed and let me know if I can assist you in the future.   These are the goals we discussed:  Goals      Patient Stated     Lose weight         This is a list of the screening recommended for you and due dates:  Health Maintenance  Topic Date Due   COVID-19 Vaccine (1) Never done   Zoster (Shingles) Vaccine (1 of 2) Never done   DTaP/Tdap/Td vaccine (3 - Td or Tdap) 08/14/2021   Hepatitis C Screening: USPSTF Recommendation to screen - Ages 18-79 yo.  05/10/2023*   Mammogram  04/15/2023   Medicare Annual Wellness Visit  05/02/2023   Colon Cancer Screening  02/10/2028   Pneumonia Vaccine  Completed   Flu Shot  Completed   DEXA scan (bone density measurement)  Completed   HPV Vaccine  Aged Out  *Topic was postponed. The date shown is not the original due date.    Advanced directives: Please bring a copy of your health care power of attorney and living will to the office at your convenience.  Conditions/risks identified: lose some weight   Next appointment: Follow up in one year for your annual wellness visit    Preventive Care 65 Years and Older, Female Preventive care refers to lifestyle choices and visits with your health care provider that can promote health and wellness. What does preventive care include? A yearly physical exam. This is also called an annual well check. Dental exams once or twice a year. Routine eye exams. Ask your health care provider how often you should have your eyes checked. Personal lifestyle choices, including: Daily care of your teeth and gums. Regular physical activity. Eating a healthy diet. Avoiding tobacco and drug use. Limiting alcohol use. Practicing safe sex. Taking low-dose aspirin every day. Taking vitamin and mineral supplements as recommended by your  health care provider. What happens during an annual well check? The services and screenings done by your health care provider during your annual well check will depend on your age, overall health, lifestyle risk factors, and family history of disease. Counseling  Your health care provider may ask you questions about your: Alcohol use. Tobacco use. Drug use. Emotional well-being. Home and relationship well-being. Sexual activity. Eating habits. History of falls. Memory and ability to understand (cognition). Work and work Statistician. Reproductive health. Screening  You may have the following tests or measurements: Height, weight, and BMI. Blood pressure. Lipid and cholesterol levels. These may be checked every 5 years, or more frequently if you are over 62 years old. Skin check. Lung cancer screening. You may have this screening every year starting at age 65 if you have a 30-pack-year history of smoking and currently smoke or have quit within the past 15 years. Fecal occult blood test (FOBT) of the stool. You may have this test every year starting at age 33. Flexible sigmoidoscopy or colonoscopy. You may have a sigmoidoscopy every 5 years or a colonoscopy every 10 years starting at age 86. Hepatitis C blood test. Hepatitis B blood test. Sexually transmitted disease (STD) testing. Diabetes screening. This is done by checking your blood sugar (glucose) after you have not eaten for a while (fasting). You may have this done every 1-3 years. Bone density scan. This is done to screen  for osteoporosis. You may have this done starting at age 67. Mammogram. This may be done every 1-2 years. Talk to your health care provider about how often you should have regular mammograms. Talk with your health care provider about your test results, treatment options, and if necessary, the need for more tests. Vaccines  Your health care provider may recommend certain vaccines, such as: Influenza vaccine.  This is recommended every year. Tetanus, diphtheria, and acellular pertussis (Tdap, Td) vaccine. You may need a Td booster every 10 years. Zoster vaccine. You may need this after age 30. Pneumococcal 13-valent conjugate (PCV13) vaccine. One dose is recommended after age 72. Pneumococcal polysaccharide (PPSV23) vaccine. One dose is recommended after age 82. Talk to your health care provider about which screenings and vaccines you need and how often you need them. This information is not intended to replace advice given to you by your health care provider. Make sure you discuss any questions you have with your health care provider. Document Released: 04/20/2015 Document Revised: 12/12/2015 Document Reviewed: 01/23/2015 Elsevier Interactive Patient Education  2017 [employer] Prevention in the Home Falls can cause injuries. They can happen to people of all ages. There are many things you can do to make your home safe and to help prevent falls. What can I do on the outside of my home? Regularly fix the edges of walkways and driveways and fix any cracks. Remove anything that might make you trip as you walk through a door, such as a raised step or threshold. Trim any bushes or trees on the path to your home. Use bright outdoor lighting. Clear any walking paths of anything that might make someone trip, such as rocks or tools. Regularly check to see if handrails are loose or broken. Make sure that both sides of any steps have handrails. Any raised decks and porches should have guardrails on the edges. Have any leaves, snow, or ice cleared regularly. Use sand or salt on walking paths during winter. Clean up any spills in your garage right away. This includes oil or grease spills. What can I do in the bathroom? Use night lights. Install grab bars by the toilet and in the tub and shower. Do not use towel bars as grab bars. Use non-skid mats or decals in the tub or shower. If you need to sit  down in the shower, use a plastic, non-slip stool. Keep the floor dry. Clean up any water that spills on the floor as soon as it happens. Remove soap buildup in the tub or shower regularly. Attach bath mats securely with double-sided non-slip rug tape. Do not have throw rugs and other things on the floor that can make you trip. What can I do in the bedroom? Use night lights. Make sure that you have a light by your bed that is easy to reach. Do not use any sheets or blankets that are too big for your bed. They should not hang down onto the floor. Have a firm chair that has side arms. You can use this for support while you get dressed. Do not have throw rugs and other things on the floor that can make you trip. What can I do in the kitchen? Clean up any spills right away. Avoid walking on wet floors. Keep items that you use a lot in easy-to-reach places. If you need to reach something above you, use a strong step stool that has a grab bar. Keep electrical cords out of the way. Do  not use floor polish or wax that makes floors slippery. If you must use wax, use non-skid floor wax. Do not have throw rugs and other things on the floor that can make you trip. What can I do with my stairs? Do not leave any items on the stairs. Make sure that there are handrails on both sides of the stairs and use them. Fix handrails that are broken or loose. Make sure that handrails are as long as the stairways. Check any carpeting to make sure that it is firmly attached to the stairs. Fix any carpet that is loose or worn. Avoid having throw rugs at the top or bottom of the stairs. If you do have throw rugs, attach them to the floor with carpet tape. Make sure that you have a light switch at the top of the stairs and the bottom of the stairs. If you do not have them, ask someone to add them for you. What else can I do to help prevent falls? Wear shoes that: Do not have high heels. Have rubber bottoms. Are  comfortable and fit you well. Are closed at the toe. Do not wear sandals. If you use a stepladder: Make sure that it is fully opened. Do not climb a closed stepladder. Make sure that both sides of the stepladder are locked into place. Ask someone to hold it for you, if possible. Clearly mark and make sure that you can see: Any grab bars or handrails. First and last steps. Where the edge of each step is. Use tools that help you move around (mobility aids) if they are needed. These include: Canes. Walkers. Scooters. Crutches. Turn on the lights when you go into a dark area. Replace any light bulbs as soon as they burn out. Set up your furniture so you have a clear path. Avoid moving your furniture around. If any of your floors are uneven, fix them. If there are any pets around you, be aware of where they are. Review your medicines with your doctor. Some medicines can make you feel dizzy. This can increase your chance of falling. Ask your doctor what other things that you can do to help prevent falls. This information is not intended to replace advice given to you by your health care provider. Make sure you discuss any questions you have with your health care provider. Document Released: 01/18/2009 Document Revised: 08/30/2015 Document Reviewed: 04/28/2014 Elsevier Interactive Patient Education  2017 [employer].

## 2022-05-08 DIAGNOSIS — H25043 Posterior subcapsular polar age-related cataract, bilateral: Secondary | ICD-10-CM | POA: Diagnosis not present

## 2022-05-08 DIAGNOSIS — H25013 Cortical age-related cataract, bilateral: Secondary | ICD-10-CM | POA: Diagnosis not present

## 2022-05-08 DIAGNOSIS — H2513 Age-related nuclear cataract, bilateral: Secondary | ICD-10-CM | POA: Diagnosis not present

## 2022-05-08 DIAGNOSIS — H18413 Arcus senilis, bilateral: Secondary | ICD-10-CM | POA: Diagnosis not present

## 2022-05-08 DIAGNOSIS — H2511 Age-related nuclear cataract, right eye: Secondary | ICD-10-CM | POA: Diagnosis not present

## 2022-05-13 DIAGNOSIS — R69 Illness, unspecified: Secondary | ICD-10-CM | POA: Diagnosis not present

## 2022-05-15 DIAGNOSIS — R69 Illness, unspecified: Secondary | ICD-10-CM | POA: Diagnosis not present

## 2022-06-11 DIAGNOSIS — Z888 Allergy status to other drugs, medicaments and biological substances status: Secondary | ICD-10-CM | POA: Diagnosis not present

## 2022-06-11 DIAGNOSIS — H2511 Age-related nuclear cataract, right eye: Secondary | ICD-10-CM | POA: Diagnosis not present

## 2022-06-11 DIAGNOSIS — Z885 Allergy status to narcotic agent status: Secondary | ICD-10-CM | POA: Diagnosis not present

## 2022-06-11 DIAGNOSIS — J45990 Exercise induced bronchospasm: Secondary | ICD-10-CM | POA: Diagnosis not present

## 2022-06-11 DIAGNOSIS — Z881 Allergy status to other antibiotic agents status: Secondary | ICD-10-CM | POA: Diagnosis not present

## 2022-06-12 DIAGNOSIS — H2512 Age-related nuclear cataract, left eye: Secondary | ICD-10-CM | POA: Diagnosis not present

## 2022-06-25 DIAGNOSIS — E7800 Pure hypercholesterolemia, unspecified: Secondary | ICD-10-CM | POA: Diagnosis not present

## 2022-06-25 DIAGNOSIS — J45909 Unspecified asthma, uncomplicated: Secondary | ICD-10-CM | POA: Diagnosis not present

## 2022-06-25 DIAGNOSIS — J45990 Exercise induced bronchospasm: Secondary | ICD-10-CM | POA: Diagnosis not present

## 2022-06-25 DIAGNOSIS — H2512 Age-related nuclear cataract, left eye: Secondary | ICD-10-CM | POA: Diagnosis not present

## 2022-06-25 DIAGNOSIS — E785 Hyperlipidemia, unspecified: Secondary | ICD-10-CM | POA: Diagnosis not present

## 2022-06-25 DIAGNOSIS — Z79899 Other long term (current) drug therapy: Secondary | ICD-10-CM | POA: Diagnosis not present

## 2022-06-25 DIAGNOSIS — E079 Disorder of thyroid, unspecified: Secondary | ICD-10-CM | POA: Diagnosis not present

## 2022-06-25 DIAGNOSIS — Z79890 Hormone replacement therapy (postmenopausal): Secondary | ICD-10-CM | POA: Diagnosis not present

## 2022-07-08 DIAGNOSIS — R69 Illness, unspecified: Secondary | ICD-10-CM | POA: Diagnosis not present

## 2022-09-27 IMAGING — RF DG C-ARM 1-60 MIN
1 series · 7 of 7 positions shown · non-contrast
Comparison: MRI 04/18/2020

CLINICAL DATA: Tibial plateau subchondral fracture

EXAM:
LEFT KNEE - 1-2 VIEW; DG C-ARM 1-60 MIN

[Series 1: run · 7 of 7 slices shown]
[im 1/7]
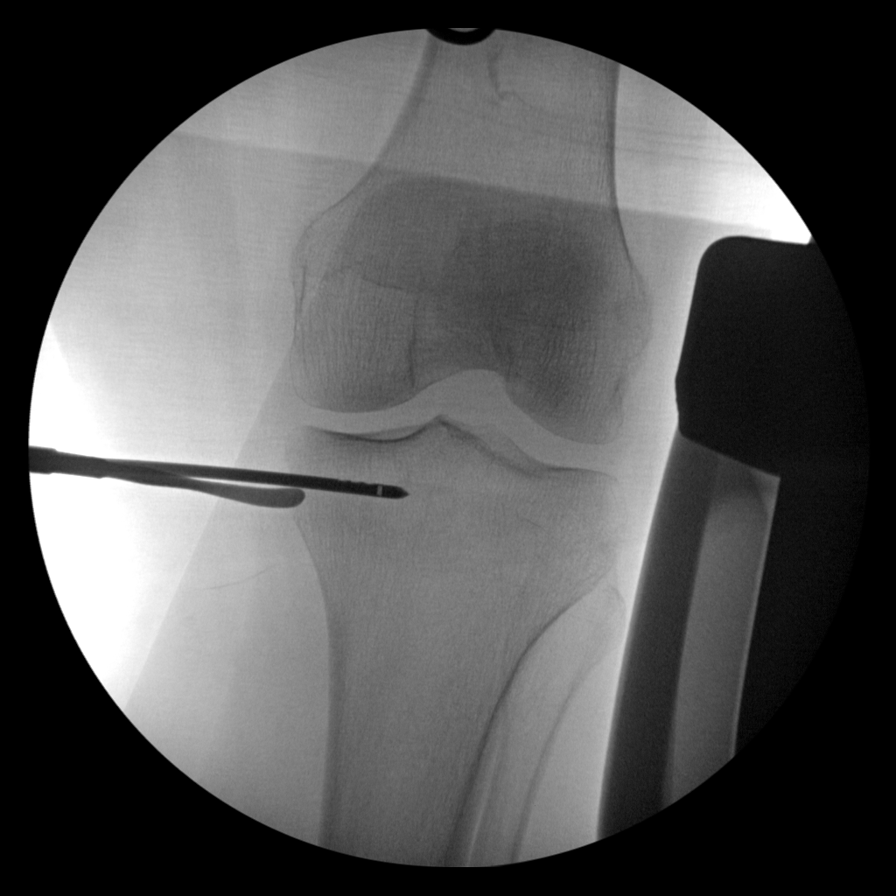
[im 2/7]
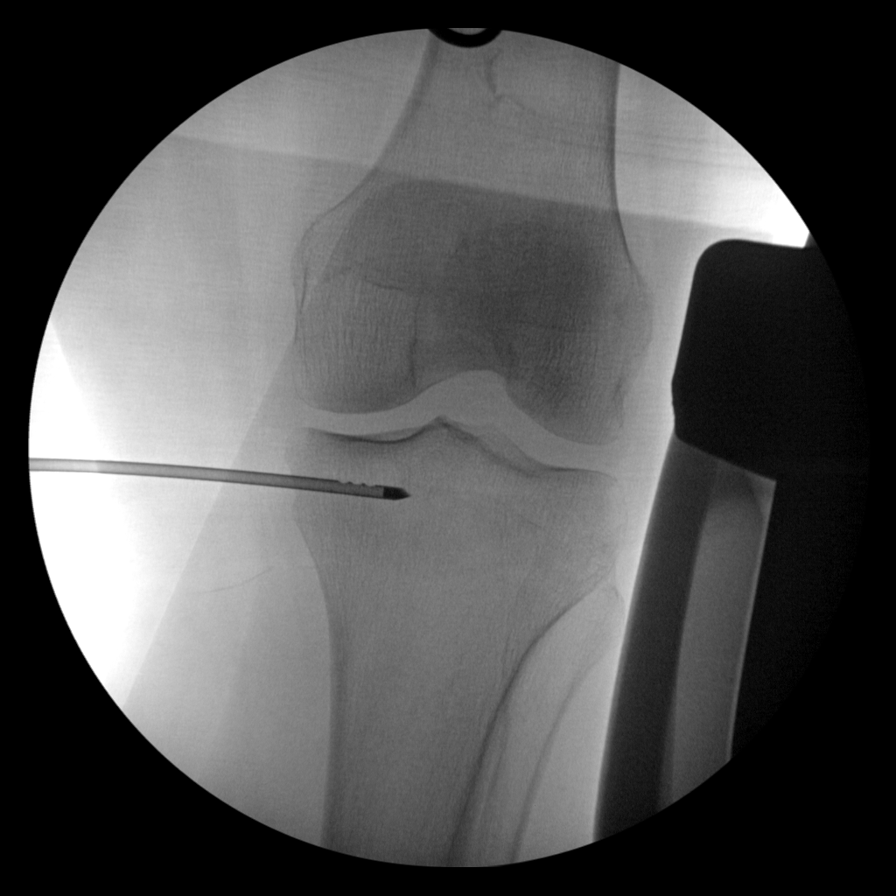
[im 3/7]
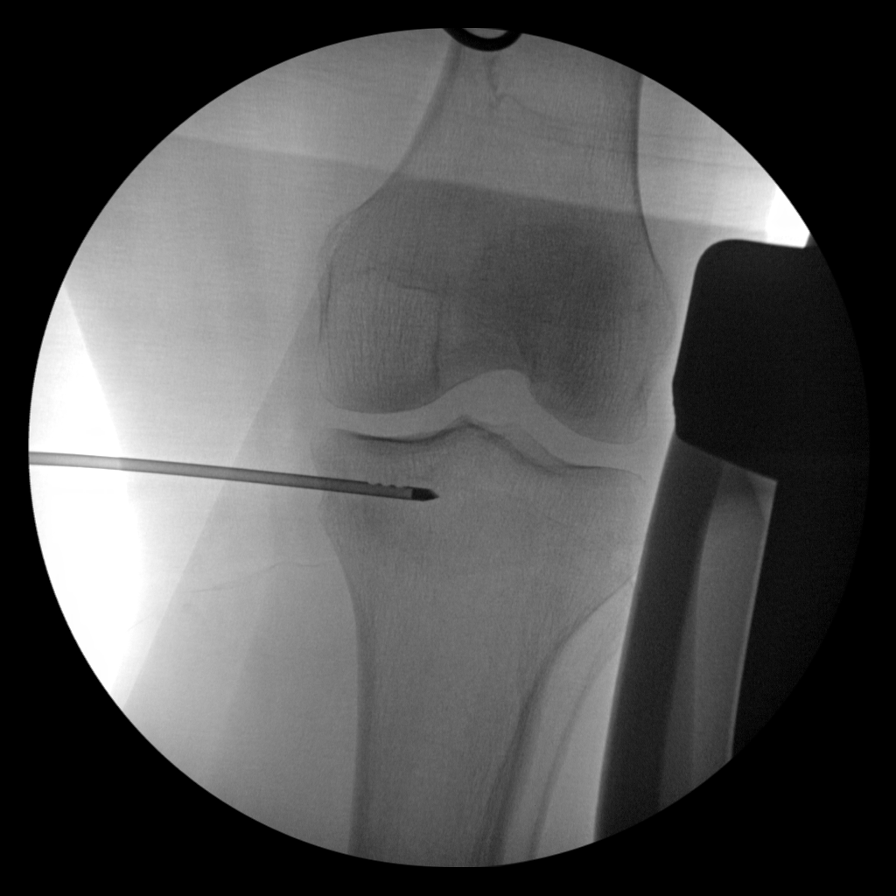
[im 4/7]
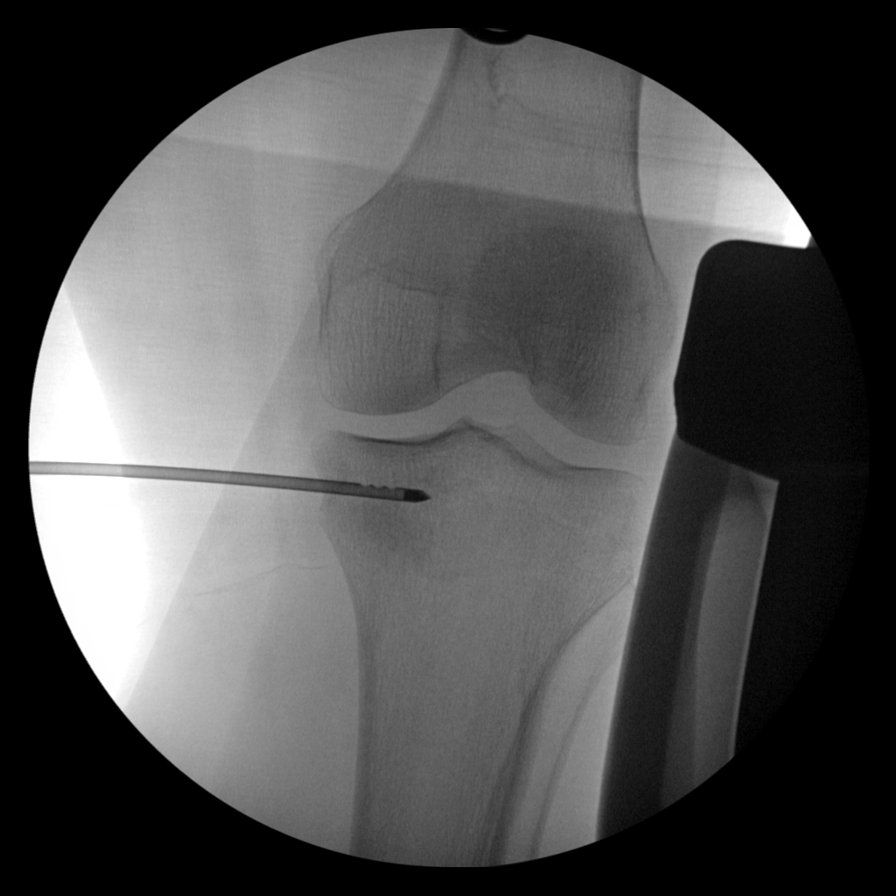
[im 5/7]
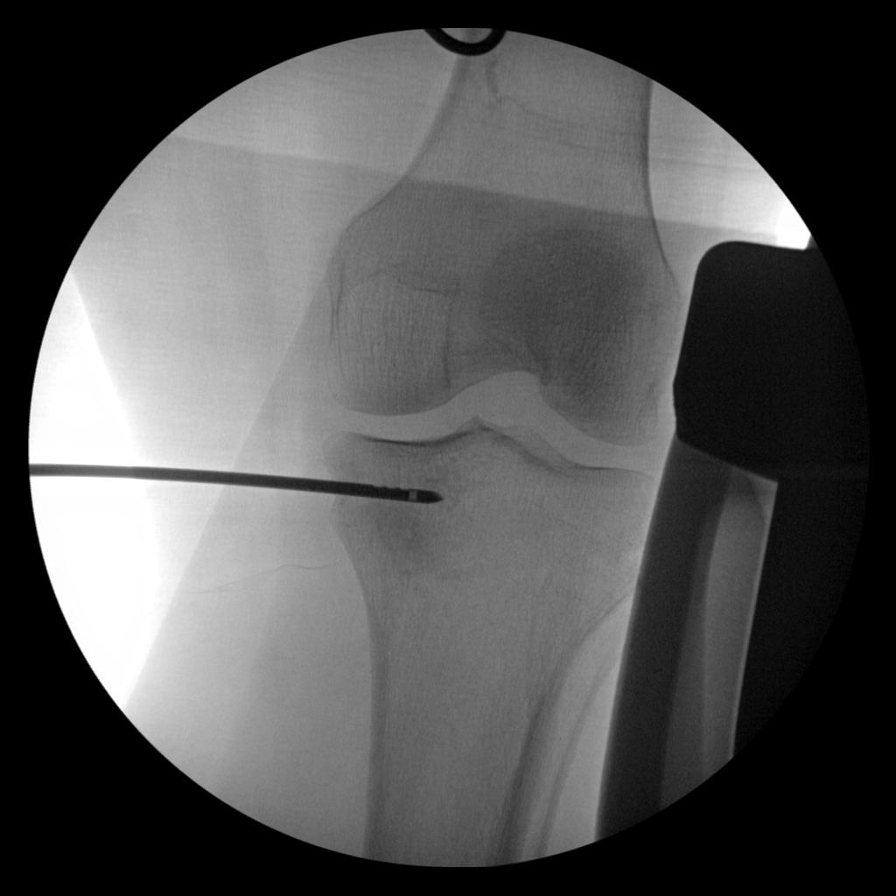
[im 6/7]
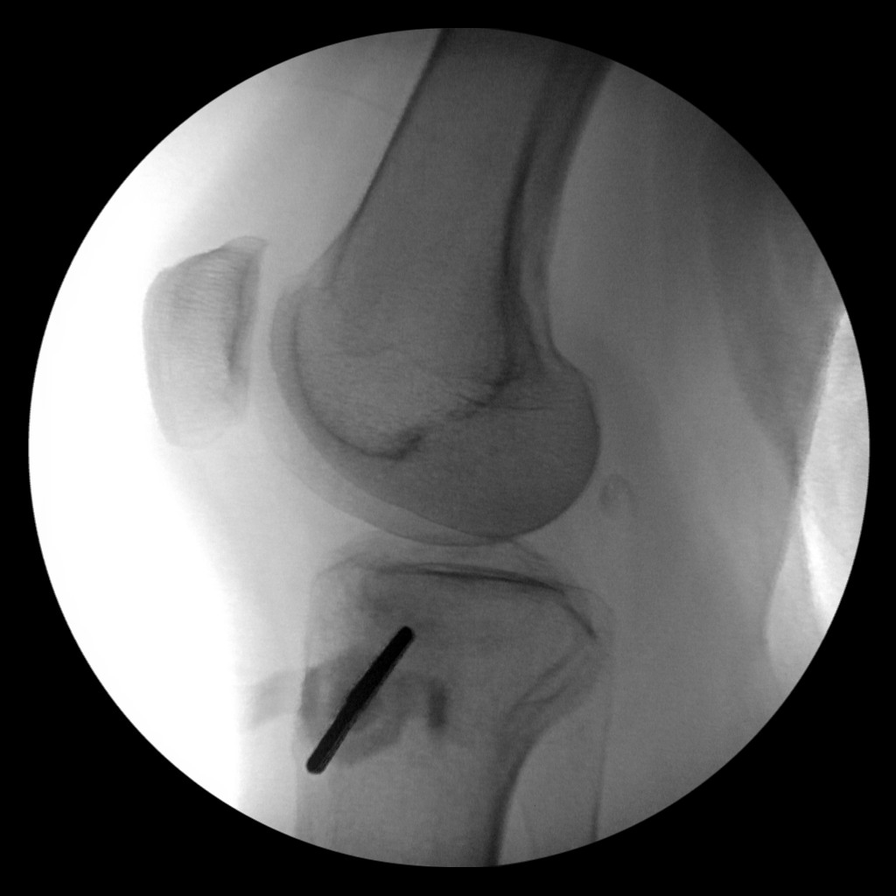
[im 7/7]
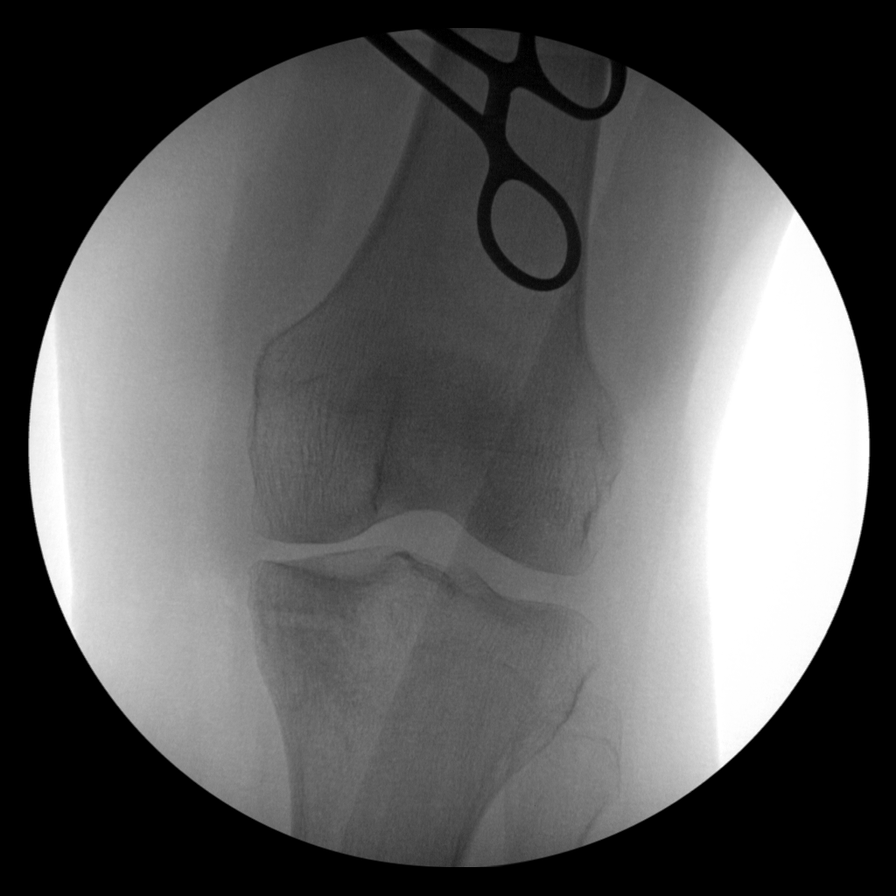

[7 of 7 positions shown; findings below may reference images not displayed]

FINDINGS: Six spot radiographs provided. Sharp tip probe enters the medial
tibial plateau.
IMPRESSION: Medial plateau subchondroplasty.

## 2022-09-27 IMAGING — RF DG KNEE 1-2V*L*
1 series · 7 of 7 positions shown · non-contrast
Comparison: MRI 04/18/2020

CLINICAL DATA: Tibial plateau subchondral fracture

EXAM:
LEFT KNEE - 1-2 VIEW; DG C-ARM 1-60 MIN

[Series 1: run · 7 of 7 slices shown]
[im 1/7]
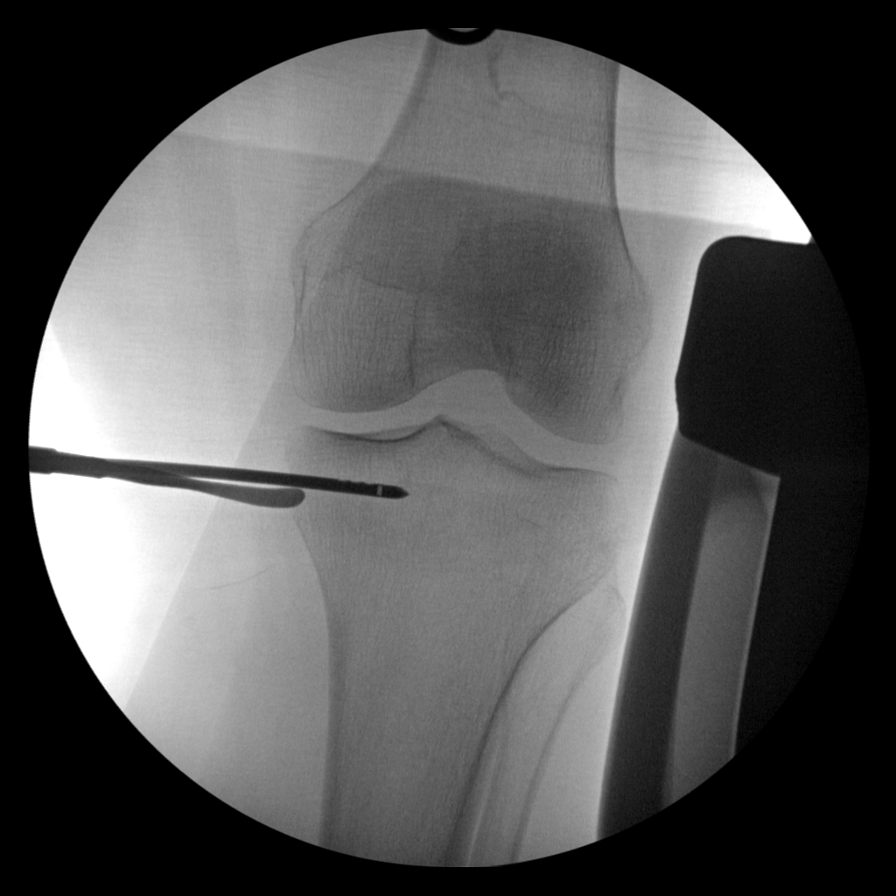
[im 2/7]
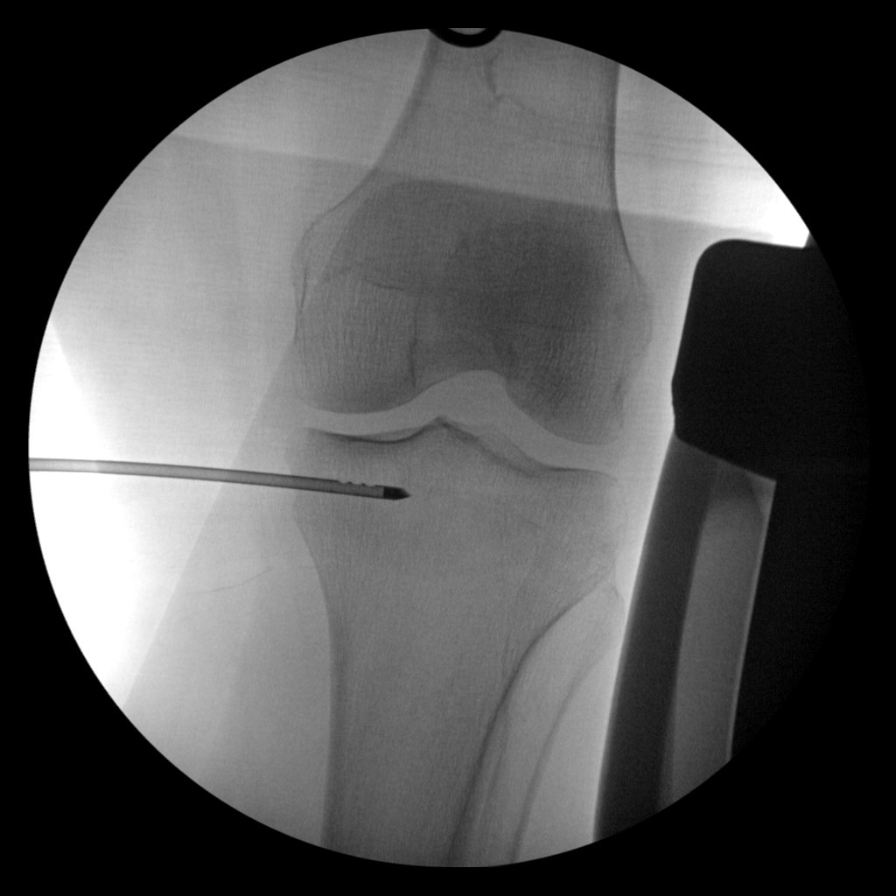
[im 3/7]
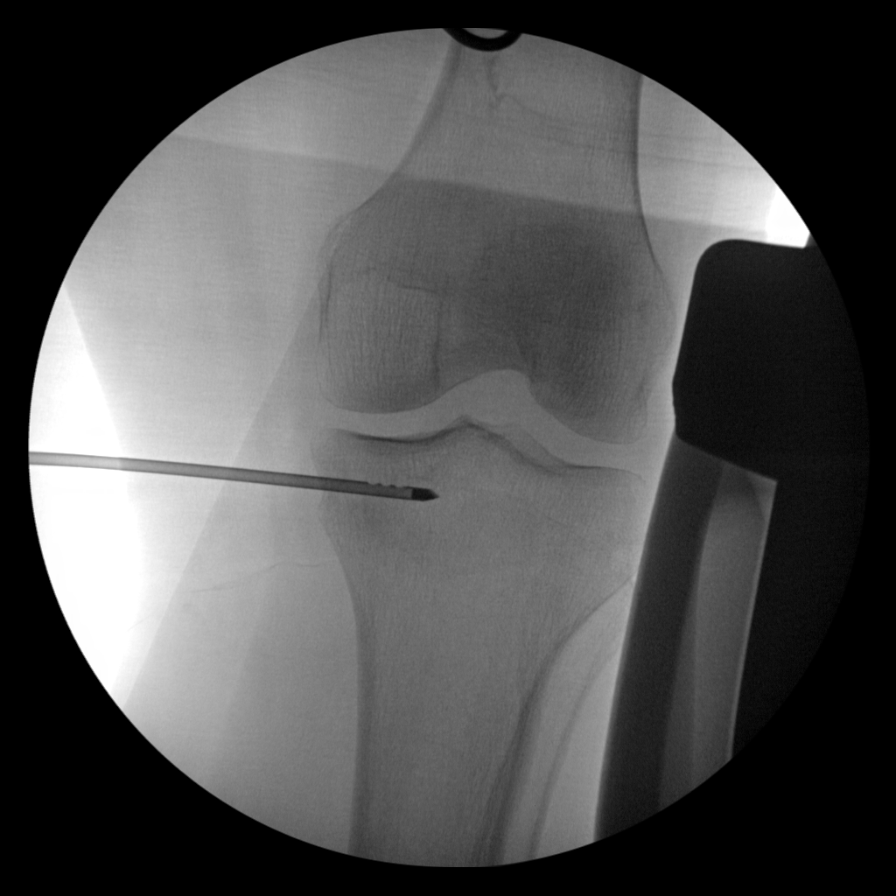
[im 4/7]
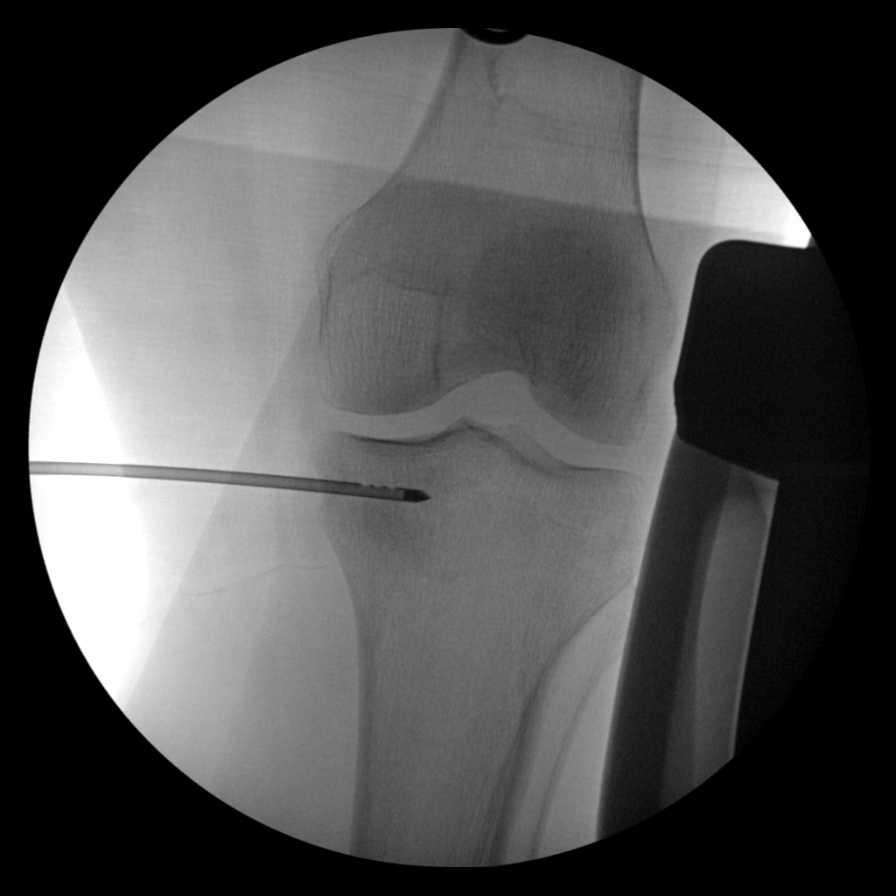
[im 5/7]
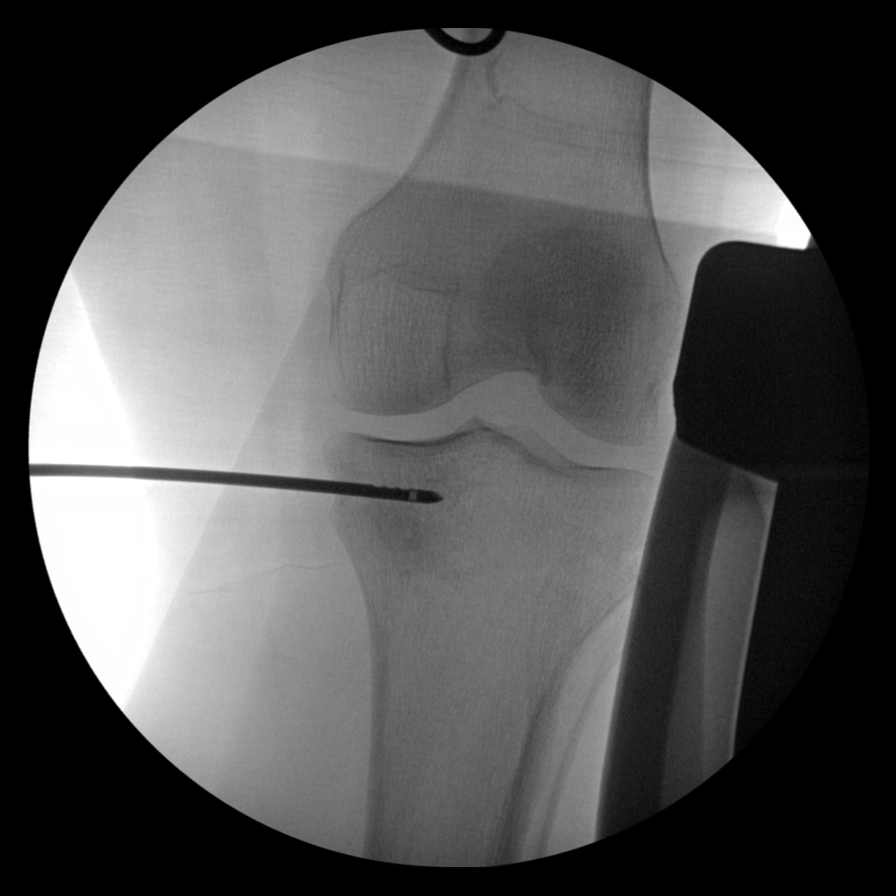
[im 6/7]
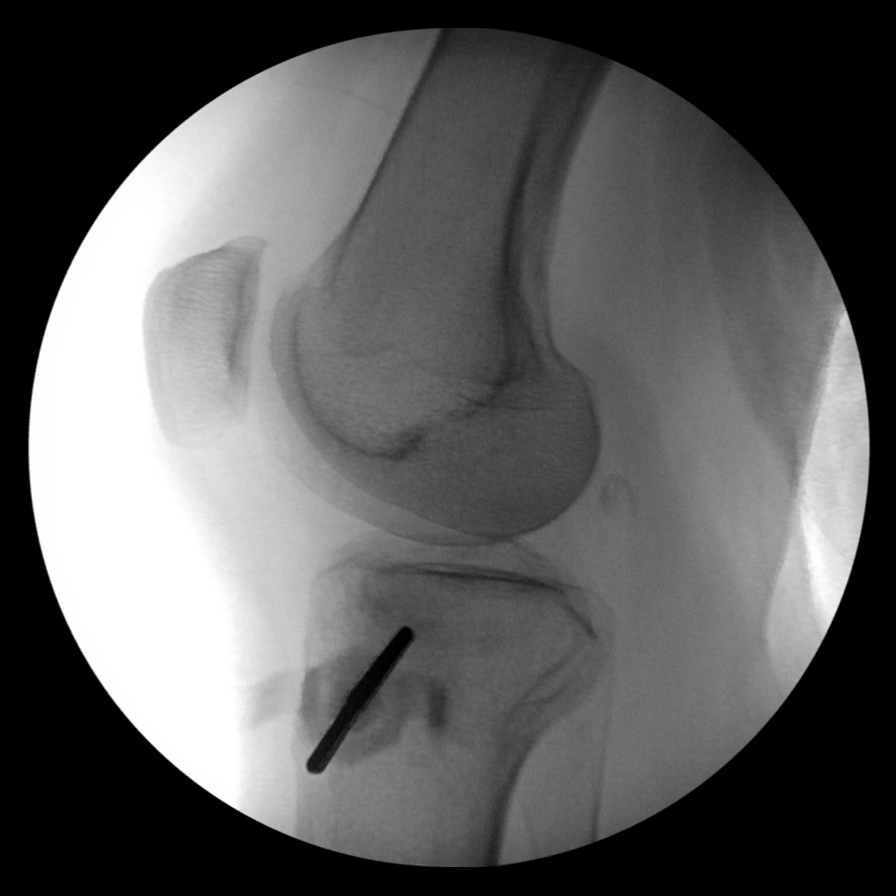
[im 7/7]
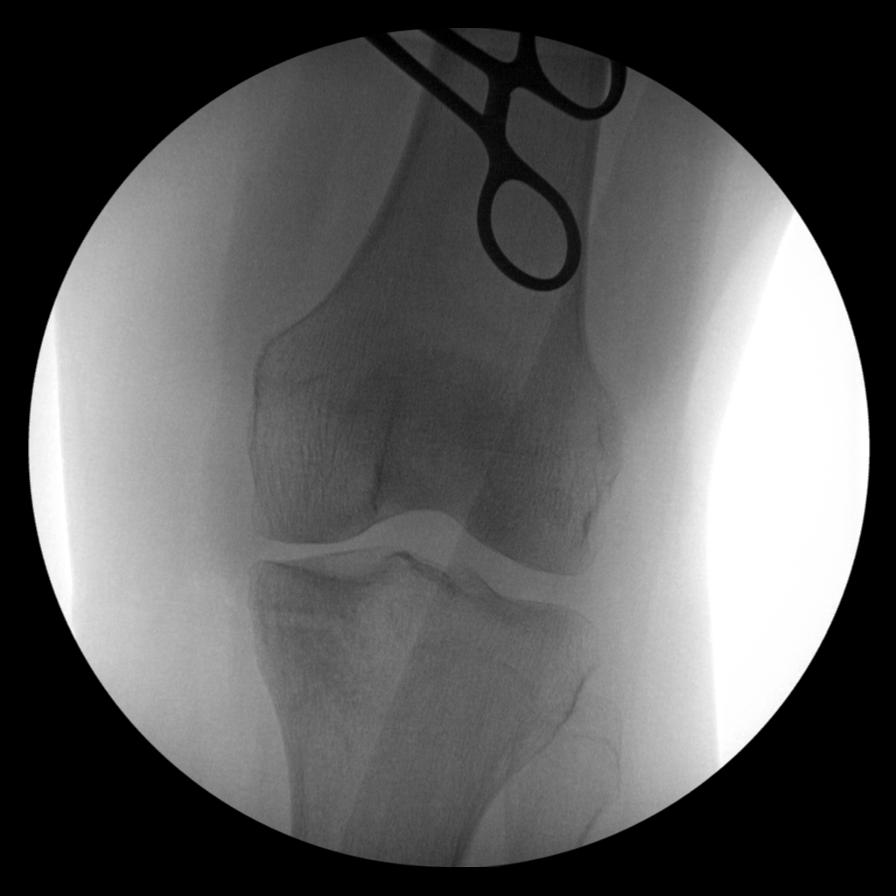

[7 of 7 positions shown; findings below may reference images not displayed]

FINDINGS: Six spot radiographs provided. Sharp tip probe enters the medial
tibial plateau.
IMPRESSION: Medial plateau subchondroplasty.

## 2022-12-28 ENCOUNTER — Other Ambulatory Visit: Payer: Self-pay | Admitting: Family Medicine

## 2023-01-18 ENCOUNTER — Telehealth: Payer: Self-pay | Admitting: Family Medicine

## 2023-01-18 DIAGNOSIS — E039 Hypothyroidism, unspecified: Secondary | ICD-10-CM

## 2023-01-18 DIAGNOSIS — E7800 Pure hypercholesterolemia, unspecified: Secondary | ICD-10-CM

## 2023-01-18 NOTE — Telephone Encounter (Signed)
-----   Message from Alvina Chou sent at 01/07/2023 10:17 AM EDT ----- Regarding: Lab orders for Mon, 10.14.24 Patient is scheduled for CPX labs, please order future labs, Thanks , Camelia Eng

## 2023-01-19 ENCOUNTER — Other Ambulatory Visit (INDEPENDENT_AMBULATORY_CARE_PROVIDER_SITE_OTHER): Payer: Medicare HMO

## 2023-01-19 DIAGNOSIS — E7800 Pure hypercholesterolemia, unspecified: Secondary | ICD-10-CM | POA: Diagnosis not present

## 2023-01-19 DIAGNOSIS — E039 Hypothyroidism, unspecified: Secondary | ICD-10-CM | POA: Diagnosis not present

## 2023-01-19 LAB — COMPREHENSIVE METABOLIC PANEL
ALT: 13 U/L (ref 0–35)
AST: 15 U/L (ref 0–37)
Albumin: 3.9 g/dL (ref 3.5–5.2)
Alkaline Phosphatase: 77 U/L (ref 39–117)
BUN: 13 mg/dL (ref 6–23)
CO2: 28 meq/L (ref 19–32)
Calcium: 9.3 mg/dL (ref 8.4–10.5)
Chloride: 104 meq/L (ref 96–112)
Creatinine, Ser: 0.92 mg/dL (ref 0.40–1.20)
GFR: 63.13 mL/min (ref >60.00)
Glucose, Bld: 94 mg/dL (ref 70–99)
Potassium: 4.6 meq/L (ref 3.5–5.1)
Sodium: 138 meq/L (ref 135–145)
Total Bilirubin: 1 mg/dL (ref 0.2–1.2)
Total Protein: 6.5 g/dL (ref 6.0–8.3)

## 2023-01-19 LAB — LIPID PANEL
Cholesterol: 201 mg/dL — ABNORMAL HIGH (ref 0–200)
HDL: 55.6 mg/dL (ref >39.00)
LDL Cholesterol: 117 mg/dL — ABNORMAL HIGH (ref 0–99)
NonHDL: 145.82
Total CHOL/HDL Ratio: 4
Triglycerides: 143 mg/dL (ref 0.0–149.0)
VLDL: 28.6 mg/dL (ref 0.0–40.0)

## 2023-01-19 LAB — TSH: TSH: 4.09 u[IU]/mL (ref 0.35–5.50)

## 2023-01-26 ENCOUNTER — Encounter: Payer: Self-pay | Admitting: Family Medicine

## 2023-01-26 ENCOUNTER — Ambulatory Visit (INDEPENDENT_AMBULATORY_CARE_PROVIDER_SITE_OTHER): Payer: Medicare HMO | Admitting: Family Medicine

## 2023-01-26 VITALS — BP 116/62 | HR 70 | Temp 98.4°F | Ht 65.25 in | Wt 162.2 lb

## 2023-01-26 DIAGNOSIS — E039 Hypothyroidism, unspecified: Secondary | ICD-10-CM

## 2023-01-26 DIAGNOSIS — Z1211 Encounter for screening for malignant neoplasm of colon: Secondary | ICD-10-CM

## 2023-01-26 DIAGNOSIS — Z0000 Encounter for general adult medical examination without abnormal findings: Secondary | ICD-10-CM | POA: Diagnosis not present

## 2023-01-26 DIAGNOSIS — M8580 Other specified disorders of bone density and structure, unspecified site: Secondary | ICD-10-CM | POA: Diagnosis not present

## 2023-01-26 DIAGNOSIS — E7800 Pure hypercholesterolemia, unspecified: Secondary | ICD-10-CM | POA: Diagnosis not present

## 2023-01-26 DIAGNOSIS — Z23 Encounter for immunization: Secondary | ICD-10-CM

## 2023-01-26 MED ORDER — ALBUTEROL SULFATE HFA 108 (90 BASE) MCG/ACT IN AERS: 2.0000 | INHALATION_SPRAY | RESPIRATORY_TRACT | 5 refills | Status: AC | PRN

## 2023-01-26 NOTE — Assessment & Plan Note (Signed)
Colonoscopy 2019 with 10 y recall

## 2023-01-26 NOTE — Assessment & Plan Note (Addendum)
Dexa 04/2022 No falls or fracture Discussed fall prevention, supplements and exercise for bone density  Option for fracture prevention treatment like alendronate is there

## 2023-01-26 NOTE — Assessment & Plan Note (Signed)
Hypothyroidism  Pt has no clinical changes No change in energy level/ hair or skin/ edema and no tremor Lab Results  Component Value Date   TSH 4.09 01/19/2023    Taking levothyroxine 88 mcg daily /takes correctly  No changes

## 2023-01-26 NOTE — Patient Instructions (Addendum)
If you are interested in the new shingles vaccine (Shingrix) - call your local pharmacy to check on coverage and availability   You are also due for a tetanus shot -if you get an injury    Avoid red meat/ fried foods/ egg yolks/ fatty breakfast meats/ butter, cheese and high fat dairy/ and shellfish   Flu shot today   Keep exercising   Take care of yourself

## 2023-01-26 NOTE — Progress Notes (Signed)
Subjective:    Patient ID: Katrina [name], female    DOB: [birth year], 72 y.o.   MRN: [id number]  HPI  Here for health maintenance exam and to review chronic medical problems   Wt Readings from Last 3 Encounters:  01/26/23 162 lb 4 oz (73.6 kg)  05/01/22 164 lb (74.4 kg)  01/06/22 164 lb 12.8 oz (74.8 kg)   26.79 kg/m  Vitals:   01/26/23 1421  BP: 116/62  Pulse: 70  Temp: 98.4 F (36.9 C)  SpO2: 94%    Immunization History  Administered Date(s) Administered   Fluad Quad(high Dose 65+) 12/29/2019, 01/03/2021, 01/06/2022   Influenza Whole 01/29/2007   Influenza, High Dose Seasonal PF 02/05/2018   Influenza,inj,Quad PF,6+ Mos 11/25/2018   Influenza-Unspecified 02/18/2013, 02/06/2014, 02/06/2016   Pneumococcal Conjugate-13 11/23/2017   Pneumococcal Polysaccharide-23 11/25/2018   Td 04/07/2001   Tdap 08/15/2011    Health Maintenance Due  Topic Date Due   INFLUENZA VACCINE  11/06/2022   Had a sore throat / covid negative Better now    Mother -lost her in march  Doing ok with grief  Has to help with 3 yo grandchild    Shingrix-wants to get vaccinated Had shingles last dec   Flu shot -flu shot   Tetanus shot -will check price   Mammogram 04/2022  Self breast exam- no lumps   Gyn health-no problems    Colon cancer screening -colonoscopy 02/2018  10 y recall   Bone health  Dexa  04/2022 osteopenia  Falls -none Fractures-none  Supplements - ca and D  Exercise  Goes to the gym - power toning/core/yoga   Some strength building   Derm care  Has to re schedule her next appointment    Eye care-had cataract surgery -both sides    Mood    01/26/2023    2:26 PM 05/01/2022    9:28 AM 01/06/2022    9:37 AM 01/03/2021    9:28 AM 12/29/2019   10:04 AM  Depression screen PHQ 2/9  Decreased Interest 0 0 0 0 0  Down, Depressed, Hopeless 0 0 0 0 0  PHQ - 2 Score 0 0 0 0 0  Altered sleeping 0  0    Tired, decreased energy 0  0    Change in appetite 0   0    Feeling bad or failure about yourself  0  0    Trouble concentrating 0  0    Moving slowly or fidgety/restless 0  0    Suicidal thoughts 0  0    PHQ-9 Score 0  0    Difficult doing work/chores Not difficult at all  Not difficult at all     Hypothyroidism  Pt has no clinical changes No change in energy level/ hair or skin/ edema and no tremor Lab Results  Component Value Date   TSH 4.09 01/19/2023    Levothyroxine 88 mcg daily   Hyperlipidemia Lab Results  Component Value Date   CHOL 201 (H) 01/19/2023   CHOL 217 (H) 12/30/2021   CHOL 220 (H) 01/03/2021   Lab Results  Component Value Date   HDL 55.60 01/19/2023   HDL 60.20 12/30/2021   HDL 65.90 01/03/2021   Lab Results  Component Value Date   LDLCALC 117 (H) 01/19/2023   LDLCALC 128 (H) 12/30/2021   LDLCALC 126 (H) 01/03/2021   Lab Results  Component Value Date   TRIG 143.0 01/19/2023   TRIG 145.0 12/30/2021   TRIG 143.0  01/03/2021   Lab Results  Component Value Date   CHOLHDL 4 01/19/2023   CHOLHDL 4 12/30/2021   CHOLHDL 3 01/03/2021   Lab Results  Component Value Date   LDLDIRECT 184.1 03/28/2013   LDLDIRECT 181.2 08/15/2011   LDLDIRECT 177.6 04/02/2010   Intol of statin/crestor Taking zetia 10 mg daily   Diet is healthy     Other labs Lab Results  Component Value Date   NA 138 01/19/2023   K 4.6 01/19/2023   CO2 28 01/19/2023   GLUCOSE 94 01/19/2023   BUN 13 01/19/2023   CREATININE 0.92 01/19/2023   CALCIUM 9.3 01/19/2023   GFR 63.13 01/19/2023   GFRNONAA 75.15 04/02/2010   Lab Results  Component Value Date   WBC 5.3 12/30/2021   HGB 14.3 12/30/2021   HCT 42.5 12/30/2021   MCV 94.0 12/30/2021   PLT 278.0 12/30/2021   Lab Results  Component Value Date   ALT 13 01/19/2023   AST 15 01/19/2023   ALKPHOS 77 01/19/2023   BILITOT 1.0 01/19/2023      Patient Active Problem List   Diagnosis Date Noted   Encounter for screening mammogram for breast cancer 01/06/2022    Encounter for routine gynecological examination 09/28/2015   Estrogen deficiency 09/28/2015   Colon cancer screening 03/28/2013   Routine general medical examination at a health care facility 08/15/2011   Post-menopausal 08/15/2011   Screening mammogram, encounter for 01/09/2011   Osteopenia 01/29/2007   Hypothyroidism 01/12/2007   Hyperlipidemia 01/12/2007   HEEL SPUR 01/12/2007   Past Medical History:  Diagnosis Date   Actinic keratosis    Asthma    Cataract 2022   Have had them removed in March 2024   Heel spur    HLD (hyperlipidemia)    Hypothyroid    Osteopenia    stress fracture in foot   Osteoporosis    Osteopenia   Plantar fasciitis    Stress fracture of foot    Past Surgical History:  Procedure Laterality Date   CHONDROPLASTY Right 07/12/2021   Procedure: CHONDROPLASTY;  Surgeon: Signa Kell, MD;  Location: [facility];  Service: Orthopedics;  Laterality: Right;   COLONOSCOPY  2020   EYE SURGERY  March 2024   Cataract removal on both eyes   KNEE ARTHROSCOPY WITH MEDIAL MENISECTOMY Right 07/12/2021   Procedure: Right knee arthroscopic medial meniscus root repair;  Surgeon: Signa Kell, MD;  Location: [facility];  Service: Orthopedics;  Laterality: Right;   KNEE ARTHROSCOPY WITH MENISCAL REPAIR Left 05/02/2020   Procedure: Left medial meniscus root repair and subchondroplasty of medial plateau with possible chondroplasty of the patella - Dedra Skeens to Assist;  Surgeon: Signa Kell, MD;  Location: [facility];  Service: Orthopedics;  Laterality: Left;   WISDOM TOOTH EXTRACTION     Social History   Tobacco Use   Smoking status: Never   Smokeless tobacco: Never  Vaping Use   Vaping status: Never Used  Substance Use Topics   Alcohol use: Not Currently    Comment: rarely   Drug use: Never   Family History  Problem Relation Age of Onset   Myelodysplastic syndrome Father    Hypertension Mother    Thyroid disease Mother    Hearing loss Mother    Hyperlipidemia  Mother    Intellectual disability Mother    Stroke Mother    Vision loss Mother    Cancer Other        GM--gallbladder   Lung cancer Other  GF   Colon cancer Other        GF   Osteoporosis Other        GM   Asthma Daughter    Breast cancer Neg Hx    Allergies  Allergen Reactions   Advil [Ibuprofen]     Not an allergy but "feels awful the next day, lethargic." Still takes it from time-to-time.   Clindamycin/Lincomycin Other (See Comments)    Caused C-diff   Codeine Nausea And Vomiting   Crestor [Rosuvastatin]     Muscle pain   Current Outpatient Medications on File Prior to Visit  Medication Sig Dispense Refill   Calcium-Magnesium-Zinc (CAL-MAG-ZINC PO) Take 1 tablet by mouth daily with lunch.     Cholecalciferol (VITAMIN D-3) 125 MCG (5000 UT) TABS Take 5,000 Units by mouth daily with lunch.     COLLAGEN PO Take 2 Scoops by mouth daily.     ezetimibe (ZETIA) 10 MG tablet TAKE 1 TABLET BY MOUTH AT BEDTIME 90 tablet 0   fluticasone (FLONASE) 50 MCG/ACT nasal spray Place 1 spray into both nostrils daily.     levothyroxine (SYNTHROID) 88 MCG tablet TAKE 1 TABLET BY MOUTH ONCE DAILY BEFORE BREAKFAST 90 tablet 0   Multiple Vitamin (MULTIVITAMIN WITH MINERALS) TABS tablet Take 1 tablet by mouth daily with lunch.     vitamin C (ASCORBIC ACID) 500 MG tablet Take 500 mg by mouth daily with lunch.     No current facility-administered medications on file prior to visit.    Review of Systems  Constitutional:  Negative for activity change, appetite change, fatigue, fever and unexpected weight change.  HENT:  Negative for congestion, ear pain, rhinorrhea, sinus pressure and sore throat.   Eyes:  Negative for pain, redness and visual disturbance.  Respiratory:  Negative for cough, shortness of breath and wheezing.   Cardiovascular:  Negative for chest pain and palpitations.  Gastrointestinal:  Negative for abdominal pain, blood in stool, constipation and diarrhea.  Endocrine:  Negative for polydipsia and polyuria.  Genitourinary:  Negative for dysuria, frequency and urgency.  Musculoskeletal:  Negative for arthralgias, back pain and myalgias.  Skin:  Negative for pallor and rash.  Allergic/Immunologic: Negative for environmental allergies.  Neurological:  Negative for dizziness, syncope and headaches.  Hematological:  Negative for adenopathy. Does not bruise/bleed easily.  Psychiatric/Behavioral:  Negative for decreased concentration and dysphoric mood. The patient is not nervous/anxious.        Objective:   Physical Exam Constitutional:      General: She is not in acute distress.    Appearance: Normal appearance. She is well-developed and normal weight. She is not ill-appearing or diaphoretic.  HENT:     Head: Normocephalic and atraumatic.     Right Ear: Tympanic membrane, ear canal and external ear normal.     Left Ear: Tympanic membrane, ear canal and external ear normal.     Nose: Nose normal. No congestion.     Mouth/Throat:     Mouth: Mucous membranes are moist.     Pharynx: Oropharynx is clear. No posterior oropharyngeal erythema.  Eyes:     General: No scleral icterus.    Extraocular Movements: Extraocular movements intact.     Conjunctiva/sclera: Conjunctivae normal.     Pupils: Pupils are equal, round, and reactive to light.  Neck:     Thyroid: No thyromegaly.     Vascular: No carotid bruit or JVD.  Cardiovascular:     Rate and Rhythm: Normal rate and regular rhythm.  Pulses: Normal pulses.     Heart sounds: Normal heart sounds.     No gallop.  Pulmonary:     Effort: Pulmonary effort is normal. No respiratory distress.     Breath sounds: Normal breath sounds. No wheezing.     Comments: Good air exch Chest:     Chest wall: No tenderness.  Abdominal:     General: Bowel sounds are normal. There is no distension or abdominal bruit.     Palpations: Abdomen is soft. There is no mass.     Tenderness: There is no abdominal tenderness.      Hernia: No hernia is present.  Genitourinary:    Comments: Breast exam: No mass, nodules, thickening, tenderness, bulging, retraction, inflamation, nipple discharge or skin changes noted.  No axillary or clavicular LA.     Musculoskeletal:        General: No tenderness. Normal range of motion.     Cervical back: Normal range of motion and neck supple. No rigidity. No muscular tenderness.     Right lower leg: No edema.     Left lower leg: No edema.     Comments: No kyphosis   Lymphadenopathy:     Cervical: No cervical adenopathy.  Skin:    General: Skin is warm and dry.     Coloration: Skin is not pale.     Findings: No erythema or rash.     Comments: Solar lentigines diffusely   Neurological:     Mental Status: She is alert. Mental status is at baseline.     Cranial Nerves: No cranial nerve deficit.     Motor: No abnormal muscle tone.     Coordination: Coordination normal.     Gait: Gait normal.     Deep Tendon Reflexes: Reflexes are normal and symmetric. Reflexes normal.  Psychiatric:        Mood and Affect: Mood normal.        Cognition and Memory: Cognition and memory normal.           Assessment & Plan:   Problem List Items Addressed This Visit       Endocrine   Hypothyroidism    Hypothyroidism  Pt has no clinical changes No change in energy level/ hair or skin/ edema and no tremor Lab Results  Component Value Date   TSH 4.09 01/19/2023    Taking levothyroxine 88 mcg daily /takes correctly  No changes          Musculoskeletal and Integument   Osteopenia    Dexa 04/2022 No falls or fracture Discussed fall prevention, supplements and exercise for bone density          Other   Colon cancer screening    Colonoscopy 2019 with 10 y recall      Hyperlipidemia    Disc goals for lipids and reasons to control them Rev last labs with pt Rev low sat fat diet in detail LDL is down to 117  Encouraged to keep working on diet Continues zetia 10 mg daily    Intol of statin       Routine general medical examination at a health care facility - Primary    Reviewed health habits including diet and exercise and skin cancer prevention Reviewed appropriate screening tests for age  Also reviewed health mt list, fam hx and immunization status , as well as social and family history   See HPI Labs reviewed and ordered Flu shot given  Will get Td if injured  Plans to get shingrix at pharmacy Mammogram utd 04/2022 Dexa utd Discussed fall prevention, supplements and exercise for bone density  Derm care is due-plans to schedule / uses sun protection  PHQ 0

## 2023-01-26 NOTE — Assessment & Plan Note (Signed)
Reviewed health habits including diet and exercise and skin cancer prevention Reviewed appropriate screening tests for age  Also reviewed health mt list, fam hx and immunization status , as well as social and family history   See HPI Labs reviewed and ordered Flu shot given  Will get Td if injured  Plans to get shingrix at pharmacy Mammogram utd 04/2022 Dexa utd Discussed fall prevention, supplements and exercise for bone density  Derm care is due-plans to schedule / uses sun protection  PHQ 0

## 2023-01-26 NOTE — Assessment & Plan Note (Signed)
Disc goals for lipids and reasons to control them Rev last labs with pt Rev low sat fat diet in detail LDL is down to 117  Encouraged to keep working on diet Continues zetia 10 mg daily   Intol of statin

## 2023-03-17 DIAGNOSIS — Z885 Allergy status to narcotic agent status: Secondary | ICD-10-CM | POA: Diagnosis not present

## 2023-03-17 DIAGNOSIS — Z79890 Hormone replacement therapy (postmenopausal): Secondary | ICD-10-CM | POA: Diagnosis not present

## 2023-03-17 DIAGNOSIS — J45990 Exercise induced bronchospasm: Secondary | ICD-10-CM | POA: Diagnosis not present

## 2023-03-17 DIAGNOSIS — M8580 Other specified disorders of bone density and structure, unspecified site: Secondary | ICD-10-CM | POA: Diagnosis not present

## 2023-03-17 DIAGNOSIS — Z823 Family history of stroke: Secondary | ICD-10-CM | POA: Diagnosis not present

## 2023-03-17 DIAGNOSIS — E039 Hypothyroidism, unspecified: Secondary | ICD-10-CM | POA: Diagnosis not present

## 2023-03-17 DIAGNOSIS — E785 Hyperlipidemia, unspecified: Secondary | ICD-10-CM | POA: Diagnosis not present

## 2023-03-17 DIAGNOSIS — M1990 Unspecified osteoarthritis, unspecified site: Secondary | ICD-10-CM | POA: Diagnosis not present

## 2023-03-17 DIAGNOSIS — Z881 Allergy status to other antibiotic agents status: Secondary | ICD-10-CM | POA: Diagnosis not present

## 2023-03-21 DIAGNOSIS — J069 Acute upper respiratory infection, unspecified: Secondary | ICD-10-CM | POA: Diagnosis not present

## 2023-03-21 DIAGNOSIS — Z03818 Encounter for observation for suspected exposure to other biological agents ruled out: Secondary | ICD-10-CM | POA: Diagnosis not present

## 2023-03-21 DIAGNOSIS — J029 Acute pharyngitis, unspecified: Secondary | ICD-10-CM | POA: Diagnosis not present

## 2023-03-21 DIAGNOSIS — H9202 Otalgia, left ear: Secondary | ICD-10-CM | POA: Diagnosis not present

## 2023-03-26 ENCOUNTER — Telehealth: Payer: Self-pay

## 2023-03-26 NOTE — Telephone Encounter (Signed)
Patient will need an appt to have this addressed. Please call and schedule.

## 2023-03-26 NOTE — Telephone Encounter (Signed)
Copied from CRM [id number]. Topic: Clinical - Medical Advice >> Mar 26, 2023  2:35 PM Elizebeth Brooking wrote: Reason for CRM: Patient called in stated she thinks she may have C.Dif, wanted to see if she could possibly come by the office to pick up a kit. Patient is requesting a callback

## 2023-03-26 NOTE — Telephone Encounter (Signed)
Have scheduled appointment for patient on 12/20 with Dr. Sharen Hones

## 2023-03-27 ENCOUNTER — Encounter: Payer: Self-pay | Admitting: Family Medicine

## 2023-03-27 ENCOUNTER — Ambulatory Visit (INDEPENDENT_AMBULATORY_CARE_PROVIDER_SITE_OTHER): Payer: Medicare HMO | Admitting: Family Medicine

## 2023-03-27 VITALS — BP 112/66 | HR 85 | Temp 98.0°F | Ht 65.25 in | Wt 164.0 lb

## 2023-03-27 DIAGNOSIS — T7840XA Allergy, unspecified, initial encounter: Secondary | ICD-10-CM

## 2023-03-27 DIAGNOSIS — R197 Diarrhea, unspecified: Secondary | ICD-10-CM | POA: Insufficient documentation

## 2023-03-27 HISTORY — DX: Allergy, unspecified, initial encounter: T78.40XA

## 2023-03-27 LAB — CBC WITH DIFFERENTIAL/PLATELET
Basophils Absolute: 0 10*3/uL (ref 0.0–0.1)
Basophils Relative: 0.6 % (ref 0.0–3.0)
Eosinophils Absolute: 0 10*3/uL (ref 0.0–0.7)
Eosinophils Relative: 0 % (ref 0.0–5.0)
HCT: 46.7 % — ABNORMAL HIGH (ref 36.0–46.0)
Hemoglobin: 15.8 g/dL — ABNORMAL HIGH (ref 12.0–15.0)
Lymphocytes Relative: 16 % (ref 12.0–46.0)
Lymphs Abs: 0.7 10*3/uL (ref 0.7–4.0)
MCHC: 33.7 g/dL (ref 30.0–36.0)
MCV: 95.8 fL (ref 78.0–100.0)
Monocytes Absolute: 0.3 10*3/uL (ref 0.1–1.0)
Monocytes Relative: 7.3 % (ref 3.0–12.0)
Neutro Abs: 3.5 10*3/uL (ref 1.4–7.7)
Neutrophils Relative %: 76.1 % (ref 43.0–77.0)
Platelets: 277 10*3/uL (ref 150.0–400.0)
RBC: 4.88 Mil/uL (ref 3.87–5.11)
RDW: 12.6 % (ref 11.5–15.5)
WBC: 4.6 10*3/uL (ref 4.0–10.5)

## 2023-03-27 LAB — COMPREHENSIVE METABOLIC PANEL
ALT: 16 U/L (ref 0–35)
AST: 21 U/L (ref 0–37)
Albumin: 4.3 g/dL (ref 3.5–5.2)
Alkaline Phosphatase: 90 U/L (ref 39–117)
BUN: 12 mg/dL (ref 6–23)
CO2: 27 meq/L (ref 19–32)
Calcium: 8.9 mg/dL (ref 8.4–10.5)
Chloride: 99 meq/L (ref 96–112)
Creatinine, Ser: 0.97 mg/dL (ref 0.40–1.20)
GFR: 59.17 mL/min — ABNORMAL LOW (ref >60.00)
Glucose, Bld: 95 mg/dL (ref 70–99)
Potassium: 4.1 meq/L (ref 3.5–5.1)
Sodium: 135 meq/L (ref 135–145)
Total Bilirubin: 0.7 mg/dL (ref 0.2–1.2)
Total Protein: 7.9 g/dL (ref 6.0–8.3)

## 2023-03-27 NOTE — Progress Notes (Signed)
Ph: [phone] Fax: [phone]   Patient ID: Katrina [name], female    DOB: January 28, 1951, 72 y.o.   MRN: [id number]  This visit was conducted in person.  BP 112/66   Pulse 85   Temp 98 F (36.7 C) (Oral)   Ht 5' 5.25" (1.657 m)   Wt 164 lb (74.4 kg)   SpO2 95%   BMI 27.08 kg/m    CC: diarrhea  Subjective:   HPI: Katrina [name] is a 72 y.o. female presenting on 03/27/2023 for Diarrhea (C/o diarrhea, nausea, chills, fever- max 99.5 and abd swelling. Sxs started 03/26/23 after taking Augmentin for acute URI. Tried Imodium. Thinks may be c diff- h/o. Pt accompanied by husband, [name]. )   1d h/o diarrhea with nausea, fever/chills Tmax 99.5 and abd discomfort/swelling that started after completing augmentin course for URI prescribed by [facility] last weekend. Took 1 augmentin pill and had allergic reaction - hives itching and chest tightness - treated with allergy pills with benefit. No tongue or lip swelling. Abx changed to zpack - URI symptoms are improved. She has tolerated plain amoxicillin in the past - thought clavulanate allergy.   Abd pain throughout lower abdomen.  Nausea no vomiting.  Diarrhea yesterday x 12, today 4. Watery stools, not formed. No blood in stool, black tarry stools, not more malodorous than normal.  No urinary symptoms.  Tested negative for COVID last night.   Grandson recently with GI bug earlier this week  Daughter may have had GI symptoms as well - also had reaction to augment.   Treating with immodium - 4 tablets yesterday and today 2.  Started using [place].  Taking tylenol  Took advil yesterday but not since.   H/o C difficile infection ~2020 after clindamycin course.       Relevant past medical, surgical, family and social history reviewed and updated as indicated. Interim medical history since our last visit reviewed. Allergies and medications reviewed and updated. Outpatient Medications Prior to Visit  Medication Sig Dispense Refill    albuterol (VENTOLIN HFA) 108 (90 Base) MCG/ACT inhaler Inhale 2 puffs into the lungs every 4 (four) hours as needed for wheezing. 1 each 5   Calcium-Magnesium-Zinc (CAL-MAG-ZINC PO) Take 1 tablet by mouth daily with lunch.     Cholecalciferol (VITAMIN D-3) 125 MCG (5000 UT) TABS Take 5,000 Units by mouth daily with lunch.     COLLAGEN PO Take 2 Scoops by mouth daily.     ezetimibe (ZETIA) 10 MG tablet TAKE 1 TABLET BY MOUTH AT BEDTIME 90 tablet 0   fluticasone (FLONASE) 50 MCG/ACT nasal spray Place 1 spray into both nostrils daily.     levothyroxine (SYNTHROID) 88 MCG tablet TAKE 1 TABLET BY MOUTH ONCE DAILY BEFORE BREAKFAST 90 tablet 0   Multiple Vitamin (MULTIVITAMIN WITH MINERALS) TABS tablet Take 1 tablet by mouth daily with lunch.     vitamin C (ASCORBIC ACID) 500 MG tablet Take 500 mg by mouth daily with lunch.     No facility-administered medications prior to visit.     Per HPI unless specifically indicated in ROS section below Review of Systems  Objective:  BP 112/66   Pulse 85   Temp 98 F (36.7 C) (Oral)   Ht 5' 5.25" (1.657 m)   Wt 164 lb (74.4 kg)   SpO2 95%   BMI 27.08 kg/m   Wt Readings from Last 3 Encounters:  03/27/23 164 lb (74.4 kg)  01/26/23 162 lb 4 oz (73.6 kg)  05/01/22 164 lb (74.4 kg)      Physical Exam Vitals and nursing note reviewed.  Constitutional:      Appearance: Normal appearance. She is not ill-appearing.  HENT:     Mouth/Throat:     Mouth: Mucous membranes are moist.     Pharynx: Oropharynx is clear. No oropharyngeal exudate or posterior oropharyngeal erythema.  Eyes:     Extraocular Movements: Extraocular movements intact.     Pupils: Pupils are equal, round, and reactive to light.  Cardiovascular:     Rate and Rhythm: Normal rate and regular rhythm.     Pulses: Normal pulses.     Heart sounds: Normal heart sounds. No murmur heard. Pulmonary:     Effort: Pulmonary effort is normal. No respiratory distress.     Breath sounds: Normal  breath sounds. No wheezing, rhonchi or rales.  Abdominal:     General: Bowel sounds are increased. There is no distension.     Palpations: Abdomen is soft. There is no hepatomegaly, splenomegaly or mass.     Tenderness: There is abdominal tenderness (mild) in the periumbilical area and suprapubic area. There is no right CVA tenderness, left CVA tenderness, guarding or rebound. Negative signs include Murphy's sign.     Hernia: No hernia is present.  Musculoskeletal:     Right lower leg: No edema.     Left lower leg: No edema.  Skin:    General: Skin is warm and dry.     Coloration: Skin is not jaundiced.  Neurological:     Mental Status: She is alert.  Psychiatric:        Mood and Affect: Mood normal.        Behavior: Behavior normal.       Results for orders placed or performed in visit on 01/19/23  Comprehensive metabolic panel   Collection Time: 01/19/23  8:25 AM  Result Value Ref Range   Sodium 138 135 - 145 mEq/L   Potassium 4.6 3.5 - 5.1 mEq/L   Chloride 104 96 - 112 mEq/L   CO2 28 19 - 32 mEq/L   Glucose, Bld 94 70 - 99 mg/dL   BUN 13 6 - 23 mg/dL   Creatinine, Ser 1.61 0.40 - 1.20 mg/dL   Total Bilirubin 1.0 0.2 - 1.2 mg/dL   Alkaline Phosphatase 77 39 - 117 U/L   AST 15 0 - 37 U/L   ALT 13 0 - 35 U/L   Total Protein 6.5 6.0 - 8.3 g/dL   Albumin 3.9 3.5 - 5.2 g/dL   GFR 09.60 >45.40 mL/min   Calcium 9.3 8.4 - 10.5 mg/dL  Lipid panel   Collection Time: 01/19/23  8:25 AM  Result Value Ref Range   Cholesterol 201 (H) 0 - 200 mg/dL   Triglycerides 981.1 0.0 - 149.0 mg/dL   HDL 91.47 >82.95 mg/dL   VLDL 62.1 0.0 - 30.8 mg/dL   LDL Cholesterol 657 (H) 0 - 99 mg/dL   Total CHOL/HDL Ratio 4    NonHDL 145.82   TSH   Collection Time: 01/19/23  8:25 AM  Result Value Ref Range   TSH 4.09 0.35 - 5.50 uIU/mL    Assessment & Plan:   Problem List Items Addressed This Visit     Diarrhea - Primary   Diarrhea associated with low grade temperature (Tmax 99.5), lower abd  discomfort.  Non acute abdomen on exam today. Anticipate infectious colitis suspect possible norovirus, less likely other infectious cause like diverticulitis.  Check  labs ,C diff (in hx) and GI pathogen panel.  She has recently had antibiotic treatment with zpack (and 1 dose of augmentin - see above).  Supportive measures reviewed - fluids, rest, clear liquid diet for bowel rest for 24-48 hours then advanced diet as tolerated.  Red flags to seek ER care over weekend reviewed ie dehydration concerns or worsening abd pain. Pt and husband agree with plan.       Relevant Orders   Comprehensive metabolic panel   CBC with Differential/Platelet   GI pathogen panel by PCR, stool   C. difficile GDH and Toxin A/B   Allergic reaction caused by a drug   Recent allergic reaction after taking 1 dose of augmentin - but she's previously tolerated plain amox in the past. Possible clavulanate allergy. Discussed, see above.         No orders of the defined types were placed in this encounter.   Orders Placed This Encounter  Procedures   GI pathogen panel by PCR, stool   Comprehensive metabolic panel   CBC with Differential/Platelet   C. difficile GDH and Toxin A/B    Patient Instructions  Labs today  Pick up stool tests  Clear liquid diet for next 1-2 days.  Ok to continue imodium.  Push fluids and rest  If worsening abdominal pain, trouble staying hydrated, seek ER care.   Follow up plan: No follow-ups on file.  Eustaquio Boyden, MD

## 2023-03-27 NOTE — Assessment & Plan Note (Signed)
Recent allergic reaction after taking 1 dose of augmentin - but she's previously tolerated plain amox in the past. Possible clavulanate allergy. Discussed, see above.

## 2023-03-27 NOTE — Addendum Note (Signed)
Addended by: Vincenza Hews on: 03/27/2023 11:38 AM   Modules accepted: Orders

## 2023-03-27 NOTE — Assessment & Plan Note (Addendum)
Diarrhea associated with low grade temperature (Tmax 99.5), lower abd discomfort.  Non acute abdomen on exam today. Anticipate infectious colitis suspect possible norovirus, less likely other infectious cause like diverticulitis.  Check labs ,C diff (in hx) and GI pathogen panel.  She has recently had antibiotic treatment with zpack (and 1 dose of augmentin - see above).  Supportive measures reviewed - fluids, rest, clear liquid diet for bowel rest for 24-48 hours then advanced diet as tolerated.  Red flags to seek ER care over weekend reviewed ie dehydration concerns or worsening abd pain. Pt and husband agree with plan.

## 2023-03-27 NOTE — Patient Instructions (Addendum)
Labs today  Pick up stool tests  Clear liquid diet for next 1-2 days.  Ok to continue imodium.  Push fluids and rest  If worsening abdominal pain, trouble staying hydrated, seek ER care.

## 2023-03-30 IMAGING — MG MM DIGITAL SCREENING BILAT W/ TOMO AND CAD
8 series · 8 of 24 positions shown · non-contrast
Comparison: Previous exam(s).

CLINICAL DATA: Screening.

EXAM:
DIGITAL SCREENING BILATERAL MAMMOGRAM WITH TOMOSYNTHESIS AND CAD
TECHNIQUE: Bilateral screening digital craniocaudal and mediolateral oblique
mammograms were obtained. Bilateral screening digital breast
tomosynthesis was performed. The images were evaluated with
computer-aided detection.

[R CC synth-2D]
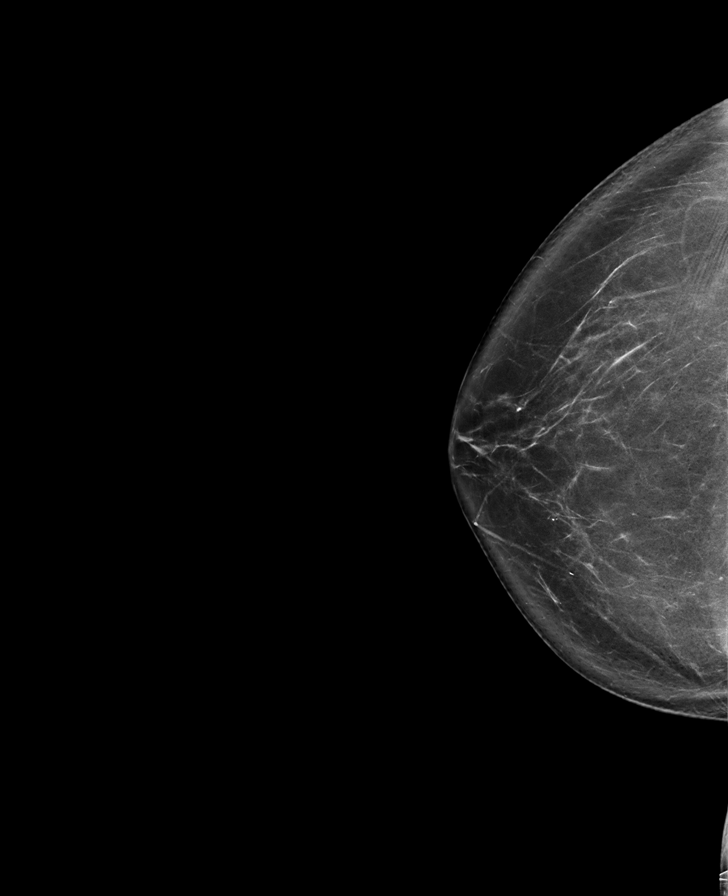

[L MLO synth-2D]
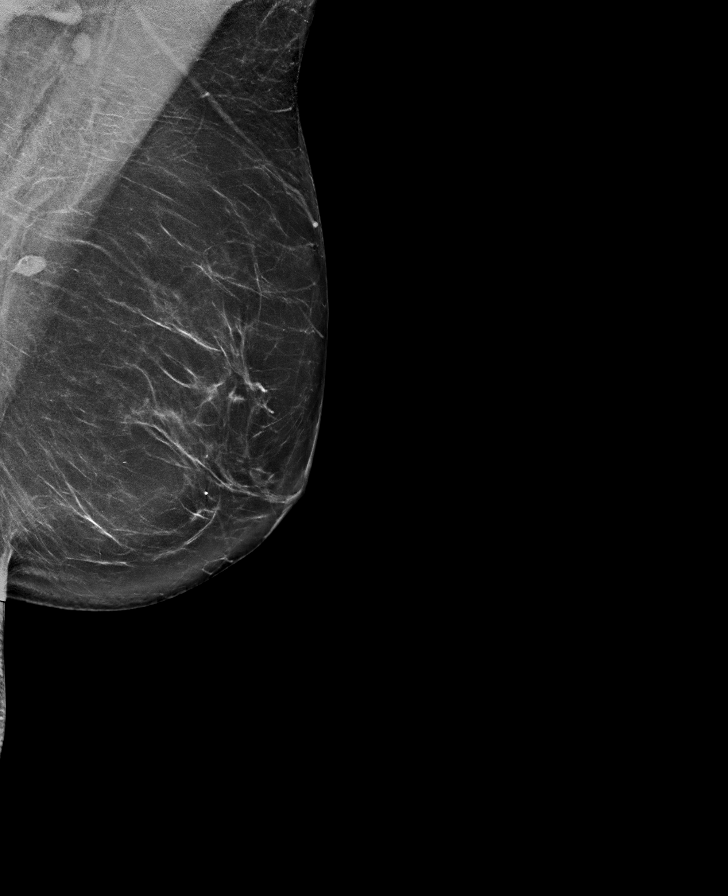

[R MLO synth-2D]
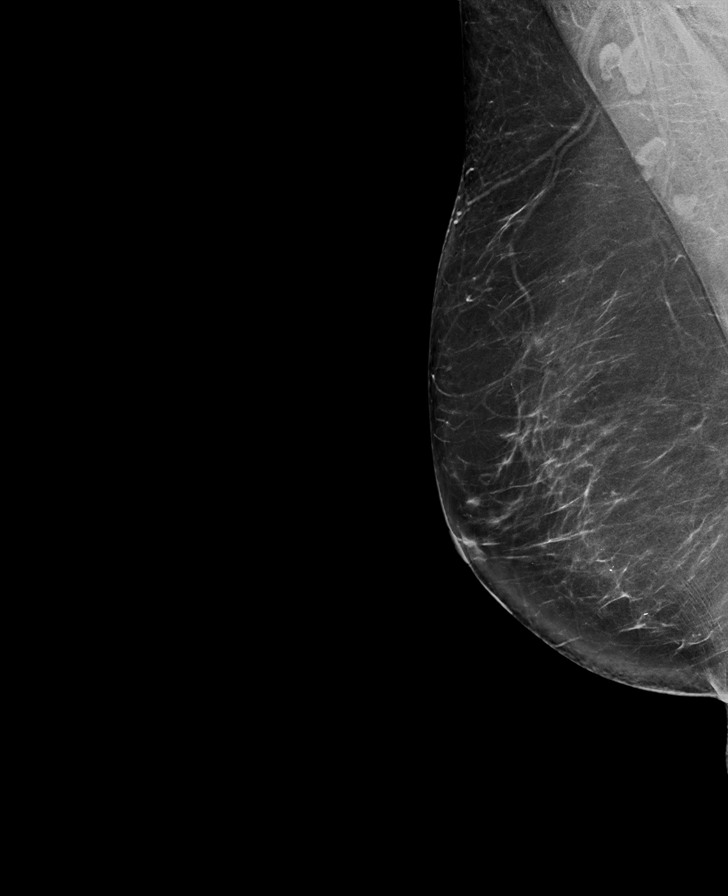

[L CC synth-2D]
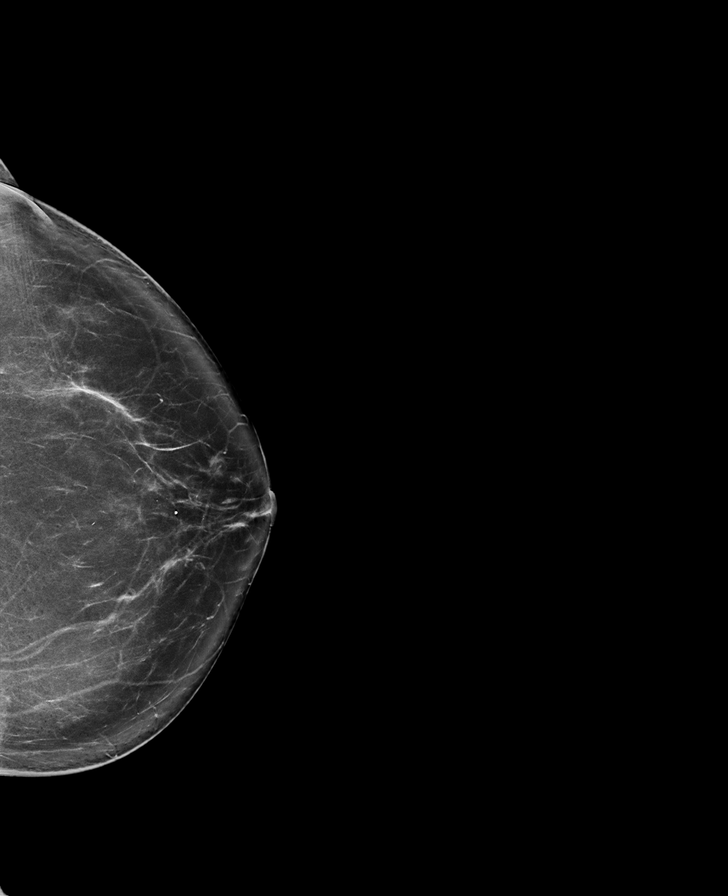

[R MLO tomo · tomo slice 45/89.0]
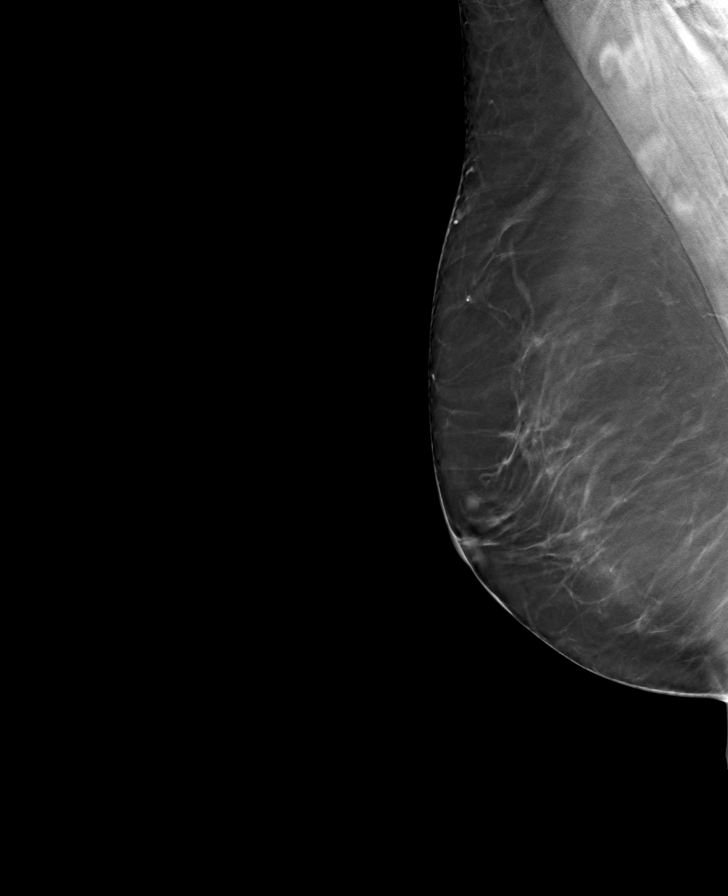

[L MLO tomo · tomo slice 42/83.0]
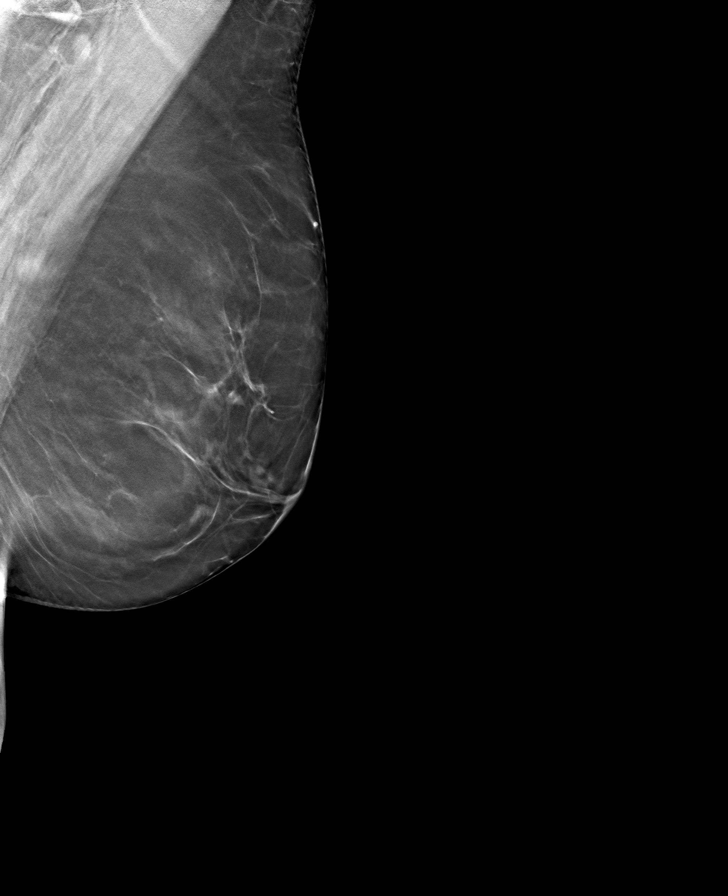

[L CC tomo · tomo slice 43/84.0]
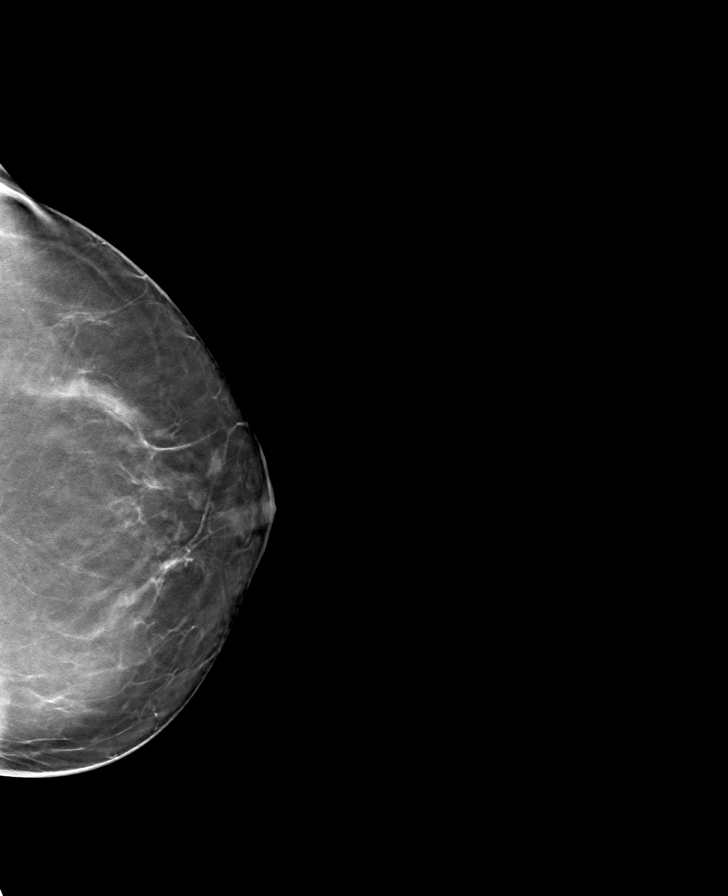

[R CC tomo · tomo slice 43/86.0]
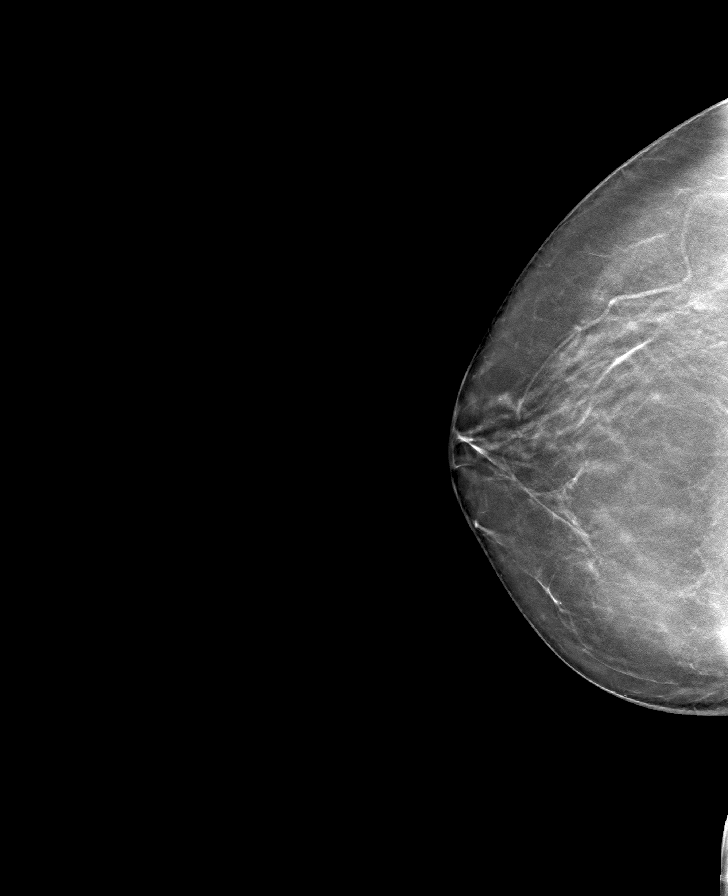

[8 of 24 positions shown; findings below may reference images not displayed]

ACR Breast Density Category b: There are scattered areas of
fibroglandular density.
FINDINGS: There are no findings suspicious for malignancy.
IMPRESSION: No mammographic evidence of malignancy. A result letter of this
screening mammogram will be mailed directly to the patient.

RECOMMENDATION:
Screening mammogram in one year. (Code:51-O-LD2)

BI-RADS CATEGORY  1: Negative.

## 2023-04-02 ENCOUNTER — Other Ambulatory Visit: Payer: Self-pay | Admitting: Family Medicine

## 2023-04-17 ENCOUNTER — Ambulatory Visit (INDEPENDENT_AMBULATORY_CARE_PROVIDER_SITE_OTHER): Payer: Medicare Other

## 2023-04-17 VITALS — Ht 66.0 in | Wt 158.0 lb

## 2023-04-17 DIAGNOSIS — Z0000 Encounter for general adult medical examination without abnormal findings: Secondary | ICD-10-CM

## 2023-04-17 NOTE — Patient Instructions (Signed)
 Katrina [name] , Thank you for taking time to come for your Medicare Wellness Visit. I appreciate your ongoing commitment to your health goals. Please review the following plan we discussed and let me know if I can assist you in the future.   Referrals/Orders/Follow-Ups/Clinician Recommendations:   You have an order for:  []   2D Mammogram  [x]   3D Mammogram  []   Bone Density     Please call for appointment:  [facility]  [address] [place] [address] [phone]  [facility] [address] [place], [place] [address] [phone]  [facility] [address] Jewell MIRZA [place], [place] [address] [phone]    Make sure to wear two-piece clothing.  No lotions, powders, or deodorants the day of the appointment. Make sure to bring picture ID and insurance card.  Bring list of medications you are currently taking including any supplements.   Schedule your [facility] screening mammogram through MyChart!   Log into your MyChart account.  Go to 'Visit' (or 'Appointments' if on mobile App) --> Schedule an Appointment  Under 'Select a Reason for Visit' choose the Mammogram Screening option.  Complete the pre-visit questions and select the time and place that best fits your schedule.    This is a list of the screening recommended for you and due dates:  Health Maintenance  Topic Date Due   COVID-19 Vaccine (1) Never done   Mammogram  04/15/2023   Zoster (Shingles) Vaccine (1 of 2) 04/28/2023*   Hepatitis C Screening  05/10/2023*   DTaP/Tdap/Td vaccine (3 - Td or Tdap) 01/26/2024*   Medicare Annual Wellness Visit  04/16/2024   Colon Cancer Screening  02/10/2028   Pneumonia Vaccine  Completed   Flu Shot  Completed   DEXA scan (bone density measurement)  Completed   HPV Vaccine  Aged Out  *Topic was postponed. The date shown is not the original  due date.    Advanced directives: (Declined) Advance directive discussed with you today. Even though you declined this today, please call our office should you change your mind, and we can give you the proper paperwork for you to fill out. Pt says she will complete this year  Next Medicare Annual Wellness Visit scheduled for next year: Yes 04/20/2023 @ 10:50am televisit

## 2023-04-17 NOTE — Progress Notes (Signed)
 Subjective:   Katrina [name] is a 73 y.o. female who presents for Medicare Annual (Subsequent) preventive examination.  Visit Complete: Virtual I connected with  Inocente MARLA Dec on 04/17/23 by a audio enabled telemedicine application and verified that I am speaking with the correct person using two identifiers.  Patient Location: Home  Provider Location: Home Office  I discussed the limitations of evaluation and management by telemedicine. The patient expressed understanding and agreed to proceed.  Vital Signs: Because this visit was a virtual/telehealth visit, some criteria may be missing or patient reported. Any vitals not documented were not able to be obtained and vitals that have been documented are patient reported.  Patient Medicare AWV questionnaire was completed by the patient on 04/13/2023; I have confirmed that all information answered by patient is correct and no changes since this date.  Cardiac Risk Factors include: advanced age (>77men, >51 women);dyslipidemia     Objective:    Today's Vitals   04/17/23 1054  Weight: 158 lb (71.7 kg)  Height: 5' 6 (1.676 m)  PainSc: 0-No pain   Body mass index is 25.5 kg/m.     04/17/2023   11:08 AM 05/01/2022    9:30 AM 07/12/2021   10:13 AM 07/09/2021   12:20 PM  Advanced Directives  Does Patient Have a Medical Advance Directive? No Yes No No  Type of Special Educational Needs Teacher of [place];Living will    Copy of Healthcare Power of Attorney in Chart?  No - copy requested    Would patient like information on creating a medical advance directive?    No - Patient declined    Current Medications (verified) Outpatient Encounter Medications as of 04/17/2023  Medication Sig   albuterol  (VENTOLIN  HFA) 108 (90 Base) MCG/ACT inhaler Inhale 2 puffs into the lungs every 4 (four) hours as needed for wheezing.   Calcium -Magnesium-Zinc (CAL-MAG-ZINC PO) Take 1 tablet by mouth daily with lunch.   Cholecalciferol (VITAMIN D -3) 125  MCG (5000 UT) TABS Take 5,000 Units by mouth daily with lunch.   COLLAGEN PO Take 2 Scoops by mouth daily.   ezetimibe  (ZETIA ) 10 MG tablet TAKE 1 TABLET BY MOUTH AT BEDTIME   fluticasone (FLONASE) 50 MCG/ACT nasal spray Place 1 spray into both nostrils daily.   levothyroxine  (SYNTHROID ) 88 MCG tablet TAKE 1 TABLET BY MOUTH ONCE DAILY BEFORE BREAKFAST   Multiple Vitamin (MULTIVITAMIN WITH MINERALS) TABS tablet Take 1 tablet by mouth daily with lunch.   vitamin C (ASCORBIC ACID) 500 MG tablet Take 500 mg by mouth daily with lunch.   No facility-administered encounter medications on file as of 04/17/2023.    Allergies (verified) Advil [ibuprofen], Amoxicillin -pot clavulanate, Clindamycin/lincomycin, Codeine, and Crestor  [rosuvastatin ]   History: Past Medical History:  Diagnosis Date   Actinic keratosis    Allergic reaction caused by a drug 03/27/2023   Asthma    Cataract 2022   Have had them removed in March 2024   Heel spur    HLD (hyperlipidemia)    Hypothyroid    Osteopenia    stress fracture in foot   Osteoporosis    Osteopenia   Plantar fasciitis    Stress fracture of foot    Past Surgical History:  Procedure Laterality Date   CHONDROPLASTY Right 07/12/2021   Procedure: CHONDROPLASTY;  Surgeon: Tobie Priest, MD;  Location: [facility] ORS;  Service: Orthopedics;  Laterality: Right;   COLONOSCOPY  2020   EYE SURGERY  March 2024   Cataract removal on both  eyes   KNEE ARTHROSCOPY WITH MEDIAL MENISECTOMY Right 07/12/2021   Procedure: Right knee arthroscopic medial meniscus root repair;  Surgeon: Tobie Priest, MD;  Location: [facility] ORS;  Service: Orthopedics;  Laterality: Right;   KNEE ARTHROSCOPY WITH MENISCAL REPAIR Left 05/02/2020   Procedure: Left medial meniscus root repair and subchondroplasty of medial plateau with possible chondroplasty of the patella - Krystal Doyne to Assist;  Surgeon: Tobie Priest, MD;  Location: [facility] ORS;  Service: Orthopedics;  Laterality: Left;   WISDOM  TOOTH EXTRACTION     Family History  Problem Relation Age of Onset   Myelodysplastic syndrome Father    Hypertension Mother    Thyroid  disease Mother    Hearing loss Mother    Hyperlipidemia Mother    Intellectual disability Mother    Stroke Mother    Vision loss Mother    Cancer Other        GM--gallbladder   Lung cancer Other        GF   Colon cancer Other        GF   Osteoporosis Other        GM   Asthma Daughter    Breast cancer Neg Hx    Social History   Socioeconomic History   Marital status: Married    Spouse name: Elgin   Number of children: 4   Years of education: Not on file   Highest education level: Bachelor's degree (e.g., BA, AB, BS)  Occupational History   Occupation: [employer]  Tobacco Use   Smoking status: Never   Smokeless tobacco: Never  Vaping Use   Vaping status: Never Used  Substance and Sexual Activity   Alcohol use: Not Currently    Comment: rarely   Drug use: Never   Sexual activity: Yes    Birth control/protection: Post-menopausal, None    Comment: No longer needed  Other Topics Concern   Not on file  Social History Narrative   Married   4 children   [employer]   Social Drivers of Health   Financial Resource Strain: Low Risk  (04/17/2023)   Overall Financial Resource Strain (CARDIA)    Difficulty of Paying Living Expenses: Not hard at all  Food Insecurity: No Food Insecurity (04/17/2023)   Hunger Vital Sign    Worried About Running Out of Food in the Last Year: Never true    Ran Out of Food in the Last Year: Never true  Transportation Needs: No Transportation Needs (04/17/2023)   PRAPARE - Administrator, Civil Service (Medical): No    Lack of Transportation (Non-Medical): No  Physical Activity: Sufficiently Active (04/17/2023)   Exercise Vital Sign    Days of Exercise per Week: 3 days    Minutes of Exercise per Session: 90 min  Stress: No Stress Concern Present (04/17/2023)   [employer] of [employer]ational [employer] - Occupational Stress Questionnaire    Feeling of Stress : Not at all  Social Connections: Socially Integrated (04/17/2023)   Social Connection and Isolation Panel [NHANES]    Frequency of Communication with Friends and Family: More than three times a week    Frequency of Social Gatherings with Friends and Family: More than three times a week    Attends Religious Services: More than 4 times per year    Active Member of [employer] or Organizations: Yes    Attends Engineer, Structural: More than 4 times per year    Marital Status: Married  Tobacco Counseling Counseling given: Not Answered   Clinical Intake:  Pre-visit preparation completed: Yes  Pain : No/denies pain Pain Score: 0-No pain   BMI - recorded: 25.5 Nutritional Status: BMI 25 -29 Overweight Nutritional Risks: None Diabetes: No  How often do you need to have someone help you when you read instructions, pamphlets, or other written materials from your doctor or pharmacy?: 1 - Never  Interpreter Needed?: No  Comments: lives with spouse Information entered by :: B.Jessabelle Markiewicz,LPN   Activities of Daily Living    04/13/2023    8:47 AM 05/01/2022    9:31 AM  In your present state of health, do you have any difficulty performing the following activities:  Hearing? 0 0  Vision? 0 0  Difficulty concentrating or making decisions? 0 0  Walking or climbing stairs? 0 0  Dressing or bathing? 0 0  Doing errands, shopping? 0 0  Preparing Food and eating ? N N  Using the Toilet? N N  In the past six months, have you accidently leaked urine? N N  Do you have problems with loss of bowel control? N N  Managing your Medications? N N  Managing your Finances? N N  Housekeeping or managing your Housekeeping? N N    Patient Care Team: Tower, Laine LABOR, MD as PCP - General  Indicate any recent Medical Services you may have received from other than [facility] providers in the past year (date may be  approximate).     Assessment:   This is a routine wellness examination for [place].  Hearing/Vision screen Hearing Screening - Comments:: Pt says she has a little diminshed vision in left ear but adequate hearing Vision Screening - Comments:: Pt says her vision good after cataracts Dr Myriam   Goals Addressed               This Visit's Progress     Patient Stated (pt-stated)   On track     04/17/23-Pt would like to Lose more weight        Depression Screen    04/17/2023   11:03 AM 01/26/2023    2:26 PM 05/01/2022    9:28 AM 01/06/2022    9:37 AM 01/03/2021    9:28 AM 12/29/2019   10:04 AM 11/25/2018   12:16 PM  PHQ 2/9 Scores  PHQ - 2 Score 0 0 0 0 0 0 0  PHQ- 9 Score  0  0       Fall Risk    04/13/2023    8:47 AM 01/26/2023    2:25 PM 05/01/2022    9:31 AM 01/06/2022    9:02 AM 01/03/2021    9:28 AM  Fall Risk   Falls in the past year? 0 0 0 0 0  Number falls in past yr: 0 0 0    Injury with Fall? 0 0 0    Risk for fall due to : No Fall Risks No Fall Risks Impaired vision    Follow up Education provided;Falls prevention discussed Falls evaluation completed Falls prevention discussed      MEDICARE RISK AT HOME: Medicare Risk at Home Any stairs in or around the home?: (Patient-Rptd) Yes If so, are there any without handrails?: (Patient-Rptd) Yes Home free of loose throw rugs in walkways, pet beds, electrical cords, etc?: (Patient-Rptd) Yes Adequate lighting in your home to reduce risk of falls?: (Patient-Rptd) Yes Life alert?: (Patient-Rptd) No Use of a cane, walker or w/c?: (Patient-Rptd) No Grab bars in the  bathroom?: (Patient-Rptd) No Shower chair or bench in shower?: (Patient-Rptd) Yes Elevated toilet seat or a handicapped toilet?: (Patient-Rptd) Yes  TIMED UP AND GO:  Was the test performed?  No    Cognitive Function:        04/17/2023   11:16 AM 05/01/2022    9:32 AM  6CIT Screen  What Year? 0 points 0 points  What month? 0 points 0 points  What  time? 0 points 0 points  Count back from 20 0 points 0 points  Months in reverse 0 points 0 points  Repeat phrase 0 points 0 points  Total Score 0 points 0 points    Immunizations Immunization History  Administered Date(s) Administered   Fluad Quad(high Dose 65+) 12/29/2019, 01/03/2021, 01/06/2022   Fluad Trivalent(High Dose 65+) 01/26/2023   Influenza Whole 01/29/2007   Influenza, High Dose Seasonal PF 02/05/2018   Influenza,inj,Quad PF,6+ Mos 11/25/2018   Influenza-Unspecified 02/18/2013, 02/06/2014, 02/06/2016   Pneumococcal Conjugate-13 11/23/2017   Pneumococcal Polysaccharide-23 11/25/2018   Td 04/07/2001   Tdap 08/15/2011    TDAP status: Up to date  Flu Vaccine status: Up to date  Pneumococcal vaccine status: Up to date  Covid-19 vaccine status: Completed vaccines  Qualifies for Shingles Vaccine? Yes   Zostavax completed Yes   Shingrix Completed?: Yes  Screening Tests Health Maintenance  Topic Date Due   COVID-19 Vaccine (1) Never done   MAMMOGRAM  04/15/2023   Zoster Vaccines- Shingrix (1 of 2) 04/28/2023 (Originally 08/19/1969)   Hepatitis C Screening  05/10/2023 (Originally 08/19/1968)   DTaP/Tdap/Td (3 - Td or Tdap) 01/26/2024 (Originally 08/14/2021)   Medicare Annual Wellness (AWV)  04/16/2024   Colonoscopy  02/10/2028   Pneumonia Vaccine 70+ Years old  Completed   INFLUENZA VACCINE  Completed   DEXA SCAN  Completed   HPV VACCINES  Aged Out    Health Maintenance  Health Maintenance Due  Topic Date Due   COVID-19 Vaccine (1) Never done   MAMMOGRAM  04/15/2023    Colorectal cancer screening: Type of screening: Colonoscopy. Completed 02/09/2018. Repeat every 10 years  Mammogram status: Ordered yes. Pt provided with contact info and advised to call to schedule appt.   Bone Density status: Completed 04/14/2022. Results reflect: Bone density results: OSTEOPENIA. Repeat every 3-5 years.  Lung Cancer Screening: (Low Dose CT Chest recommended if Age 20-80  years, 20 pack-year currently smoking OR have quit w/in 15years.) does not qualify.   Lung Cancer Screening Referral: no  Additional Screening:  Hepatitis C Screening: does not qualify; Completed no  Vision Screening: Recommended annual ophthalmology exams for early detection of glaucoma and other disorders of the eye. Is the patient up to date with their annual eye exam?  Yes  Who is the provider or what is the name of the office in which the patient attends annual eye exams? Dr Dick Dec If pt is not established with a provider, would they like to be referred to a provider to establish care? No .   Dental Screening: Recommended annual dental exams for proper oral hygiene  Diabetic Foot Exam: n/a  Community Resource Referral / Chronic Care Management: CRR required this visit?  No   CCM required this visit?  No     Plan:     I have personally reviewed and noted the following in the patient's chart:   Medical and social history Use of alcohol, tobacco or illicit drugs  Current medications and supplements including opioid prescriptions. Patient is not currently taking  opioid prescriptions. Functional ability and status Nutritional status Physical activity Advanced directives List of other physicians Hospitalizations, surgeries, and ER visits in previous 12 months Vitals Screenings to include cognitive, depression, and falls Referrals and appointments  In addition, I have reviewed and discussed with patient certain preventive protocols, quality metrics, and best practice recommendations. A written personalized care plan for preventive services as well as general preventive health recommendations were provided to patient.    Erminio LITTIE Saris, LPN   [id number]/[id number]   After Visit Summary: (MyChart) Due to this being a telephonic visit, the after visit summary with patients personalized plan was offered to patient via MyChart   Nurse Notes: The patient states she is doing well  and has no concerns or questions at this time.

## 2023-05-25 ENCOUNTER — Ambulatory Visit: Payer: Self-pay | Admitting: Family Medicine

## 2023-05-25 NOTE — Telephone Encounter (Signed)
  Chief Complaint: flu exposure Symptoms: dull headache, body aches, dry cough Frequency: symptoms started yesterday and worsened today Pertinent Negatives: Patient denies fever Disposition: [] ED /[] Urgent Care (no appt availability in office) / [] Appointment(In office/virtual)/ []  [place]/ [] Home Care/ [] Refused Recommended Disposition /[]  Mobile Bus/ []  Follow-up with PCP Additional Notes: The patient returned a call to nurse triage and reported that she has been exposed to the flu.  She has a dull headache that started yesterday.  Today she has body aches, a non productive cough, and loss of appetite.  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  His symptoms started Saturday.  She declined an office visit and would like to know if Oscillococcinum will help her symptoms.  Routed to pcp to further advise.    Reason for Disposition  [1] Influenza EXPOSURE (Close Contact) within last 48 hours (2 days) AND [2] exposed person is HIGH RISK (e.g., age > 64 years, pregnant, HIV+, chronic medical condition)  Answer Assessment - Initial Assessment Questions 1. TYPE of EXPOSURE: "How were you exposed?" (e.g., close contact, not a close contact)     Close contact 73 yo diagnosed with flu A  2. DATE of EXPOSURE: "When did the exposure occur?" (e.g., hour, days, weeks) Daily this week      Diagnosed today 05/25/23 4. HIGH RISK for COMPLICATIONS: "Do you have any heart or lung problems?" "Do you have a weakened immune system?" (e.g., CHF, COPD, asthma, HIV positive, chemotherapy, renal failure, diabetes mellitus, sickle cell anemia)     Exercise induced asthma  5. SYMPTOMS: "Do you have any symptoms?" (e.g., cough, fever, sore throat, difficulty breathing).     Body aches, cough, dry cough, dull headache - yesterday, loss of appetite All of there symptoms started this morning  Protocols used: Influenza (Flu) Exposure-A-AH

## 2023-05-25 NOTE — Telephone Encounter (Signed)
This RN first attempt to contact patient for triage. No answer, voicemail left requesting return call to clinic.

## 2023-05-25 NOTE — Telephone Encounter (Signed)
I don't think that Oscillococcinum is studied by the FDA , don't know if it would help and I don't know much about it  Treat symptoms  Follow up if needed   Agree with ER precautions if trouble breathing or severe symptoms

## 2023-05-26 NOTE — Telephone Encounter (Signed)
Pt notified of Dr. Tower's comments and verbalized understanding  

## 2023-06-23 ENCOUNTER — Ambulatory Visit
Admission: RE | Admit: 2023-06-23 | Discharge: 2023-06-23 | Disposition: A | Discharge status: Home / Self Care | Source: Ambulatory Visit | Attending: Family Medicine | Admitting: Family Medicine

## 2023-06-23 DIAGNOSIS — Z0000 Encounter for general adult medical examination without abnormal findings: Secondary | ICD-10-CM | POA: Diagnosis not present

## 2023-06-23 DIAGNOSIS — Z1231 Encounter for screening mammogram for malignant neoplasm of breast: Secondary | ICD-10-CM | POA: Insufficient documentation

## 2023-06-25 ENCOUNTER — Other Ambulatory Visit: Payer: Self-pay | Admitting: Family Medicine

## 2023-06-25 DIAGNOSIS — R928 Other abnormal and inconclusive findings on diagnostic imaging of breast: Secondary | ICD-10-CM

## 2023-06-30 ENCOUNTER — Encounter: Payer: Self-pay | Admitting: Family Medicine

## 2023-06-30 ENCOUNTER — Ambulatory Visit
Admission: RE | Admit: 2023-06-30 | Discharge: 2023-06-30 | Disposition: A | Discharge status: Home / Self Care | Source: Ambulatory Visit | Attending: Family Medicine | Admitting: Family Medicine

## 2023-06-30 DIAGNOSIS — R928 Other abnormal and inconclusive findings on diagnostic imaging of breast: Secondary | ICD-10-CM

## 2023-06-30 DIAGNOSIS — N6322 Unspecified lump in the left breast, upper inner quadrant: Secondary | ICD-10-CM | POA: Diagnosis not present

## 2023-06-30 DIAGNOSIS — R92322 Mammographic fibroglandular density, left breast: Secondary | ICD-10-CM | POA: Diagnosis not present

## 2023-06-30 DIAGNOSIS — N6002 Solitary cyst of left breast: Secondary | ICD-10-CM | POA: Diagnosis not present

## 2023-08-25 DIAGNOSIS — H26492 Other secondary cataract, left eye: Secondary | ICD-10-CM | POA: Diagnosis not present

## 2023-08-25 DIAGNOSIS — Z961 Presence of intraocular lens: Secondary | ICD-10-CM | POA: Diagnosis not present

## 2023-08-25 DIAGNOSIS — H26491 Other secondary cataract, right eye: Secondary | ICD-10-CM | POA: Diagnosis not present

## 2023-11-22 ENCOUNTER — Emergency Department

## 2023-11-22 ENCOUNTER — Observation Stay
Admission: EM | Admit: 2023-11-22 | Discharge: 2023-11-24 | Disposition: A | Discharge status: Home / Self Care | Attending: Podiatry | Admitting: Podiatry

## 2023-11-22 ENCOUNTER — Other Ambulatory Visit: Payer: Self-pay

## 2023-11-22 DIAGNOSIS — R7301 Impaired fasting glucose: Secondary | ICD-10-CM

## 2023-11-22 DIAGNOSIS — S82432A Displaced oblique fracture of shaft of left fibula, initial encounter for closed fracture: Secondary | ICD-10-CM | POA: Diagnosis not present

## 2023-11-22 DIAGNOSIS — M47816 Spondylosis without myelopathy or radiculopathy, lumbar region: Secondary | ICD-10-CM | POA: Diagnosis not present

## 2023-11-22 DIAGNOSIS — M25572 Pain in left ankle and joints of left foot: Secondary | ICD-10-CM

## 2023-11-22 DIAGNOSIS — S82852A Displaced trimalleolar fracture of left lower leg, initial encounter for closed fracture: Principal | ICD-10-CM | POA: Insufficient documentation

## 2023-11-22 DIAGNOSIS — W101XXA Fall (on)(from) sidewalk curb, initial encounter: Secondary | ICD-10-CM | POA: Diagnosis not present

## 2023-11-22 DIAGNOSIS — J45909 Unspecified asthma, uncomplicated: Secondary | ICD-10-CM | POA: Insufficient documentation

## 2023-11-22 DIAGNOSIS — E039 Hypothyroidism, unspecified: Secondary | ICD-10-CM | POA: Diagnosis not present

## 2023-11-22 DIAGNOSIS — S8252XA Displaced fracture of medial malleolus of left tibia, initial encounter for closed fracture: Secondary | ICD-10-CM | POA: Diagnosis not present

## 2023-11-22 DIAGNOSIS — S82452A Displaced comminuted fracture of shaft of left fibula, initial encounter for closed fracture: Secondary | ICD-10-CM | POA: Diagnosis not present

## 2023-11-22 DIAGNOSIS — M25552 Pain in left hip: Secondary | ICD-10-CM | POA: Diagnosis not present

## 2023-11-22 DIAGNOSIS — S82852S Displaced trimalleolar fracture of left lower leg, sequela: Secondary | ICD-10-CM

## 2023-11-22 DIAGNOSIS — E785 Hyperlipidemia, unspecified: Secondary | ICD-10-CM | POA: Diagnosis not present

## 2023-11-22 DIAGNOSIS — M25551 Pain in right hip: Secondary | ICD-10-CM | POA: Diagnosis not present

## 2023-11-22 DIAGNOSIS — S82402A Unspecified fracture of shaft of left fibula, initial encounter for closed fracture: Secondary | ICD-10-CM | POA: Diagnosis not present

## 2023-11-22 MED ORDER — OXYCODONE-ACETAMINOPHEN 5-325 MG PO TABS
1.0000 | ORAL_TABLET | Freq: Once | ORAL | Status: AC
  Administered 2023-11-22: 1 via ORAL
  Filled 2023-11-22: qty 1

## 2023-11-22 MED ORDER — ONDANSETRON HCL 4 MG/2ML IJ SOLN: 4.0000 mg | Freq: Once | INTRAMUSCULAR | Status: AC

## 2023-11-22 MED ORDER — PROPOFOL 10 MG/ML IV BOLUS
INTRAVENOUS | Status: AC | PRN
  Administered 2023-11-22: 20 mg via INTRAVENOUS

## 2023-11-22 MED ORDER — ONDANSETRON HCL 4 MG/2ML IJ SOLN
INTRAMUSCULAR | Status: AC
  Administered 2023-11-22: 4 mg via INTRAVENOUS
  Filled 2023-11-22: qty 2

## 2023-11-22 MED ORDER — SODIUM CHLORIDE 0.9 % IV BOLUS
1000.0000 mL | Freq: Once | INTRAVENOUS | Status: AC
  Administered 2023-11-22: 1000 mL via INTRAVENOUS

## 2023-11-22 MED ORDER — FENTANYL CITRATE PF 50 MCG/ML IJ SOSY
50.0000 ug | PREFILLED_SYRINGE | Freq: Once | INTRAMUSCULAR | Status: AC
  Administered 2023-11-22: 50 ug via INTRAVENOUS
  Filled 2023-11-22: qty 1

## 2023-11-22 MED ORDER — PROPOFOL 10 MG/ML IV BOLUS
0.5000 mg/kg | Freq: Once | INTRAVENOUS | Status: AC
  Administered 2023-11-22: 50 mg via INTRAVENOUS
  Filled 2023-11-22: qty 20

## 2023-11-22 NOTE — ED Triage Notes (Signed)
 Pt to ed from home via POV for left ankle injury. Pt tripped and landed on her bottom. Pt felt the left foot turned one direction. Pt denies hitting her head. Pt is caox4, and in no acute distress in triage. Pt has mild swelling to ankle.

## 2023-11-22 NOTE — ED Notes (Signed)
 Pt presented to ED after tripping down stairs, states twisted left ankle. CMS intact. 2+ dorsal pulse noted in left foot. Sensation intact. Swelling noted to left ankle.

## 2023-11-22 NOTE — ED Provider Notes (Signed)
 [facility] Provider Note    Event Date/Time   First MD Initiated Contact with Patient 11/22/23 2201     (approximate)   History   Ankle Pain (left)   HPI  Katrina [name] is a 73 y.o. female with a past medical history of thyroid  disorder, hyperlipidemia who presents to the emergency department with left ankle pain.  Patient was in her usual state of health and was stepping down stairs and stepped off the stair wrong on her left and twisted her ankle and immediately had pain and was unable to bear weight.  She denies falling.  She is not on any blood thinners.  She reports since the incident she is feels as if her hip is stretched but denies any trauma to the area.  She denies any sensation changes in her extremity.  She last took p.o. 4 hours prior to arrival.  She presents with her son and husband who contributes to the history      Physical Exam   Triage Vital Signs: ED Triage Vitals [11/22/23 2019]  Encounter Vitals Group     BP 112/73     Girls Systolic BP Percentile      Girls Diastolic BP Percentile      Boys Systolic BP Percentile      Boys Diastolic BP Percentile      Pulse Rate 74     Resp 16     Temp 98.3 F (36.8 C)     Temp Source Oral     SpO2 99 %     Weight      Height 5' 6 (1.676 m)     Head Circumference      Peak Flow      Pain Score 8     Pain Loc      Pain Education      Exclude from Growth Chart     Most recent vital signs: Vitals:   11/22/23 2345 11/22/23 2350  BP: 118/78 (!) 125/97  Pulse: 65 73  Resp: 19 19  Temp:    SpO2: 97% 99%    Nursing Triage Note reviewed. Vital signs reviewed and patients oxygen saturation is normoxic  General: Patient is well nourished, well developed, awake and alert, appears in pain Head: Normocephalic and atraumatic Eyes: Normal inspection, extraocular muscles intact, no conjunctival pallor Ear, nose, throat: Normal external exam Neck: Normal range of motion Respiratory:  Patient is in no respiratory distress, lungs CTAB Cardiovascular: Patient is not tachycardic, RRR without murmur appreciated GI: Abd SNT with no guarding or rebound  Back: Normal inspection of the back with good strength and range of motion throughout all ext Extremities: pulses intact with good cap refills, no LE pitting edema or calf tenderness Patient's left hip is nontender no ecchymosis.  Patient's left ankle with obvious deformity and tender to palpation but has 2+ DP and PT pulses and sensation intact with good cap refill Neuro: The patient is alert and oriented to person, place, and time, appropriately conversive, with 5/5 bilat UE/LE strength, no gross motor or sensory defects noted. Coordination appears to be adequate. Skin: Warm, dry, and intact Psych: normal mood and affect, no SI or HI  ED Results / Procedures / Treatments   Labs (all labs ordered are listed, but only abnormal results are displayed) Labs Reviewed  BASIC METABOLIC PANEL WITH GFR  CBC WITH DIFFERENTIAL/PLATELET     EKG   RADIOLOGY Xray Left ankle: Trimalleolar fracture with dislocation on  my independent review and interpretation radiologist agrees    PROCEDURES:  Critical Care performed: No  .Sedation  Date/Time: 11/23/2023 12:15 AM  Performed by: Nicholaus Rolland BRAVO, MD Authorized by: Nicholaus Rolland BRAVO, MD   Consent:    Consent obtained:  Verbal and written   Consent given by:  Patient and spouse   Risks discussed:  Allergic reaction, prolonged hypoxia resulting in organ damage, prolonged sedation necessitating reversal, inadequate sedation, respiratory compromise necessitating ventilatory assistance and intubation and nausea   Alternatives discussed:  Analgesia without sedation and anxiolysis Universal protocol:    Procedure explained and questions answered to patient or proxy's satisfaction: yes     Relevant documents present and verified: yes     Site/side marked: yes     Immediately prior to  procedure, a time out was called: yes   Indications:    Procedure performed:  Imaging studies   Procedure necessitating sedation performed by:  Physician performing sedation Pre-sedation assessment:    Time since last food or drink:  6:00 pm   ASA classification: class 2 - patient with mild systemic disease     Mallampati score:  I - soft palate, uvula, fauces, pillars visible   Neck mobility: normal     Pre-sedation assessments completed and reviewed: airway patency, hydration status, mental status, nausea/vomiting and pain level   A pre-sedation assessment was completed prior to the start of the procedure Procedure details (see MAR for exact dosages):    Sedation:  Propofol    Intended level of sedation: deep   Intra-procedure events: none     Total Provider sedation time (minutes):  20 Post-procedure details:   A post-sedation assessment was completed following the completion of the procedure.   Procedure completion:  Tolerated well, no immediate complications .Reduction of fracture  Date/Time: 11/23/2023 12:16 AM  Performed by: Nicholaus Rolland BRAVO, MD Authorized by: Nicholaus Rolland BRAVO, MD  Consent: Written consent obtained Risks and benefits: risks, benefits and alternatives were discussed Consent given by: patient and spouse Patient understanding: patient states understanding of the procedure being performed Patient consent: the patient's understanding of the procedure matches consent given Procedure consent: procedure consent matches procedure scheduled Relevant documents: relevant documents present and verified Test results: test results available and properly labeled Site marked: the operative site was marked Imaging studies: imaging studies available Patient identity confirmed: verbally with patient and arm band Time out: Immediately prior to procedure a time out was called to verify the correct patient, procedure, equipment, support staff and site/side marked as  required. Preparation: Patient was prepped and draped in the usual sterile fashion.  Sedation: Patient sedated: yes Sedatives: propofol   Patient tolerance: patient tolerated the procedure well with no immediate complications   .Splint Application  Date/Time: 11/23/2023 12:17 AM  Performed by: Nicholaus Rolland BRAVO, MD Authorized by: Nicholaus Rolland BRAVO, MD   Consent:    Consent obtained:  Written   Consent given by:  Patient and spouse   Risks, benefits, and alternatives were discussed: yes     Risks discussed:  Discoloration, numbness and swelling   Alternatives discussed:  No treatment and delayed treatment Universal protocol:    Procedure explained and questions answered to patient or proxy's satisfaction: yes     Relevant documents present and verified: yes     Site/side marked: yes     Patient identity confirmed:  Verbally with patient and arm band Pre-procedure details:    Distal neurologic exam:  Normal   Distal perfusion: distal pulses  strong   Procedure details:    Location:  Ankle   Ankle location:  L ankle   Cast type:  Short leg   Splint type:  Ankle stirrup and short leg   Supplies:  Fiberglass   Attestation: Splint applied and adjusted personally by me (And Emergency room technician)   Post-procedure details:    Distal neurologic exam:  Normal   Distal perfusion: distal pulses strong, brisk capillary refill and unchanged     Post-procedure imaging: reviewed      MEDICATIONS ORDERED IN ED: Medications  oxyCODONE -acetaminophen  (PERCOCET/ROXICET) 5-325 MG per tablet 1 tablet (1 tablet Oral Given 11/22/23 2235)  sodium chloride  0.9 % bolus 1,000 mL (0 mLs Intravenous Stopped 11/23/23 0017)  propofol  (DIPRIVAN ) 10 mg/mL bolus/IV push 36.3 mg (50 mg Intravenous Given 11/22/23 2333)  ondansetron  (ZOFRAN ) injection 4 mg (4 mg Intravenous Given 11/22/23 2328)  propofol  (DIPRIVAN ) 10 mg/mL bolus/IV push (20 mg Intravenous Given 11/22/23 2334)  fentaNYL  (SUBLIMAZE ) injection  50 mcg (50 mcg Intravenous Given 11/22/23 2337)     IMPRESSION / MDM / ASSESSMENT AND PLAN / ED COURSE                                Differential diagnosis includes, but is not limited to, this is a 73 year old female who presents to the emergency department with left ankle pain   Differential diagnosis: Trimalar fracture, contusion, sprain, hip fracture  ED course: Patient arrives and has obvious deformity of the left ankle.  Although she complains of hip pain low suspicion for any fracture.  X-ray of the ankle performed in triage did demonstrate a displaced trimalar fracture.  I reviewed the indications benefits risks and alternatives of deep sedation with closed reduction and splint application with patient and family and they voiced understanding and opted to proceed.  This went without difficulty.  Case discussed with Dr. Lennie and although alignment much improved, not perfect with fibula.  He recommends CT scan of the ankle and admission to hospitalist with plans to operate tomorrow afternoon and patient should be n.p.o. till 5 AM.  Patient in agreement with the plan.  Case signed out to oncoming physician pending basic lab work and x-ray of the hip   Clinical Course as of 11/23/23 0020  Sun Nov 22, 2023  2211 I talked to Prentice Lennie on-call for podiatry who recommends closed reduction and deep sedation and afterwards obtaining postreduction films and a CT scan of the leg [HD]  2216 0 [HD]    Clinical Course User Index [HD] Nicholaus Rolland BRAVO, MD     FINAL CLINICAL IMPRESSION(S) / ED DIAGNOSES   Final diagnoses:  Closed trimalleolar fracture of left ankle, initial encounter  Acute left ankle pain     Rx / DC Orders   ED Discharge Orders     None        Note:  This document was prepared using Dragon voice recognition software and may include unintentional dictation errors.   Nicholaus Rolland BRAVO, MD 11/23/23 [id number]

## 2023-11-23 ENCOUNTER — Observation Stay: Admitting: Anesthesiology

## 2023-11-23 ENCOUNTER — Observation Stay

## 2023-11-23 ENCOUNTER — Encounter
Admission: EM | Disposition: A | Discharge status: Home / Self Care | Payer: Self-pay | Source: Home / Self Care | Attending: Emergency Medicine

## 2023-11-23 ENCOUNTER — Encounter: Payer: Self-pay | Admitting: Internal Medicine

## 2023-11-23 ENCOUNTER — Other Ambulatory Visit

## 2023-11-23 ENCOUNTER — Other Ambulatory Visit: Payer: Self-pay

## 2023-11-23 DIAGNOSIS — S82852S Displaced trimalleolar fracture of left lower leg, sequela: Secondary | ICD-10-CM

## 2023-11-23 DIAGNOSIS — S8252XA Displaced fracture of medial malleolus of left tibia, initial encounter for closed fracture: Secondary | ICD-10-CM | POA: Diagnosis not present

## 2023-11-23 DIAGNOSIS — G8918 Other acute postprocedural pain: Secondary | ICD-10-CM | POA: Diagnosis not present

## 2023-11-23 DIAGNOSIS — S82852A Displaced trimalleolar fracture of left lower leg, initial encounter for closed fracture: Secondary | ICD-10-CM | POA: Diagnosis not present

## 2023-11-23 DIAGNOSIS — S82892A Other fracture of left lower leg, initial encounter for closed fracture: Secondary | ICD-10-CM | POA: Diagnosis not present

## 2023-11-23 DIAGNOSIS — E785 Hyperlipidemia, unspecified: Secondary | ICD-10-CM

## 2023-11-23 DIAGNOSIS — M25551 Pain in right hip: Secondary | ICD-10-CM | POA: Diagnosis not present

## 2023-11-23 DIAGNOSIS — E039 Hypothyroidism, unspecified: Secondary | ICD-10-CM

## 2023-11-23 DIAGNOSIS — M25552 Pain in left hip: Secondary | ICD-10-CM | POA: Diagnosis not present

## 2023-11-23 DIAGNOSIS — S82452A Displaced comminuted fracture of shaft of left fibula, initial encounter for closed fracture: Secondary | ICD-10-CM | POA: Diagnosis not present

## 2023-11-23 DIAGNOSIS — M47816 Spondylosis without myelopathy or radiculopathy, lumbar region: Secondary | ICD-10-CM | POA: Diagnosis not present

## 2023-11-23 HISTORY — PX: ORIF ANKLE FRACTURE: SHX5408

## 2023-11-23 LAB — CBC WITH DIFFERENTIAL/PLATELET
Abs Immature Granulocytes: 0.01 K/uL (ref 0.00–0.07)
Basophils Absolute: 0.1 K/uL (ref 0.0–0.1)
Basophils Relative: 1 %
Eosinophils Absolute: 0.1 K/uL (ref 0.0–0.5)
Eosinophils Relative: 1 %
HCT: 39.4 % (ref 36.0–46.0)
Hemoglobin: 13 g/dL (ref 12.0–15.0)
Immature Granulocytes: 0 %
Lymphocytes Relative: 20 %
Lymphs Abs: 1.5 K/uL (ref 0.7–4.0)
MCH: 31.9 pg (ref 26.0–34.0)
MCHC: 33 g/dL (ref 30.0–36.0)
MCV: 96.6 fL (ref 80.0–100.0)
Monocytes Absolute: 0.6 K/uL (ref 0.1–1.0)
Monocytes Relative: 8 %
Neutro Abs: 5.3 K/uL (ref 1.7–7.7)
Neutrophils Relative %: 70 %
Platelets: 247 K/uL (ref 150–400)
RBC: 4.08 MIL/uL (ref 3.87–5.11)
RDW: 12.3 % (ref 11.5–15.5)
WBC: 7.5 K/uL (ref 4.0–10.5)
nRBC: 0 % (ref 0.0–0.2)

## 2023-11-23 LAB — BASIC METABOLIC PANEL WITH GFR
Anion gap: 10 (ref 5–15)
BUN: 21 mg/dL (ref 8–23)
CO2: 26 mmol/L (ref 22–32)
Calcium: 8.5 mg/dL — ABNORMAL LOW (ref 8.9–10.3)
Chloride: 106 mmol/L (ref 98–111)
Creatinine, Ser: 0.97 mg/dL (ref 0.44–1.00)
GFR, Estimated: >60 mL/min (ref >60)
Glucose, Bld: 115 mg/dL — ABNORMAL HIGH (ref 70–99)
Potassium: 4 mmol/L (ref 3.5–5.1)
Sodium: 142 mmol/L (ref 135–145)

## 2023-11-23 SURGERY — OPEN REDUCTION INTERNAL FIXATION (ORIF) ANKLE FRACTURE
Anesthesia: General | Site: Ankle | Laterality: Left

## 2023-11-23 MED ORDER — ACETAMINOPHEN 650 MG RE SUPP: 650.0000 mg | Freq: Four times a day (QID) | RECTAL | Status: DC | PRN

## 2023-11-23 MED ORDER — OXYCODONE HCL 5 MG/5ML PO SOLN: 5.0000 mg | Freq: Once | ORAL | Status: DC | PRN

## 2023-11-23 MED ORDER — EPHEDRINE SULFATE-NACL 50-0.9 MG/10ML-% IV SOSY
PREFILLED_SYRINGE | INTRAVENOUS | Status: DC | PRN
  Administered 2023-11-23 (×2): 5 mg via INTRAVENOUS
  Administered 2023-11-23: 10 mg via INTRAVENOUS

## 2023-11-23 MED ORDER — PROPOFOL 10 MG/ML IV BOLUS
INTRAVENOUS | Status: AC
  Filled 2023-11-23: qty 20

## 2023-11-23 MED ORDER — MIDAZOLAM HCL 2 MG/2ML IJ SOLN
INTRAMUSCULAR | Status: DC | PRN
  Administered 2023-11-23: 2 mg via INTRAVENOUS

## 2023-11-23 MED ORDER — BUPIVACAINE LIPOSOME 1.3 % IJ SUSP
INTRAMUSCULAR | Status: DC | PRN
Start: 2023-11-23 — End: 2023-11-23
  Administered 2023-11-23 (×2): 10 mL via PERINEURAL

## 2023-11-23 MED ORDER — OXYCODONE HCL 5 MG PO TABS: 5.0000 mg | ORAL_TABLET | Freq: Once | ORAL | Status: DC | PRN

## 2023-11-23 MED ORDER — GLYCOPYRROLATE 0.2 MG/ML IJ SOLN
INTRAMUSCULAR | Status: DC | PRN
  Administered 2023-11-23: .2 mg via INTRAVENOUS

## 2023-11-23 MED ORDER — DEXAMETHASONE SODIUM PHOSPHATE 10 MG/ML IJ SOLN
INTRAMUSCULAR | Status: AC
Start: 2023-11-23 — End: 2023-11-23
  Filled 2023-11-23: qty 1

## 2023-11-23 MED ORDER — BUPIVACAINE LIPOSOME 1.3 % IJ SUSP
INTRAMUSCULAR | Status: AC
  Filled 2023-11-23: qty 20

## 2023-11-23 MED ORDER — ACETAMINOPHEN 325 MG PO TABS
650.0000 mg | ORAL_TABLET | Freq: Four times a day (QID) | ORAL | Status: DC | PRN
  Filled 2023-11-23: qty 2

## 2023-11-23 MED ORDER — EZETIMIBE 10 MG PO TABS
10.0000 mg | ORAL_TABLET | Freq: Every day | ORAL | Status: DC
  Administered 2023-11-23: 10 mg via ORAL
  Filled 2023-11-23: qty 1

## 2023-11-23 MED ORDER — BUPIVACAINE HCL (PF) 0.5 % IJ SOLN
INTRAMUSCULAR | Status: AC
  Filled 2023-11-23: qty 20

## 2023-11-23 MED ORDER — FENTANYL CITRATE (PF) 100 MCG/2ML IJ SOLN
INTRAMUSCULAR | Status: DC | PRN
  Administered 2023-11-23 (×2): 25 ug via INTRAVENOUS

## 2023-11-23 MED ORDER — ACETAMINOPHEN 10 MG/ML IV SOLN
INTRAVENOUS | Status: AC
  Filled 2023-11-23: qty 100

## 2023-11-23 MED ORDER — MORPHINE SULFATE (PF) 2 MG/ML IV SOLN: 2.0000 mg | INTRAVENOUS | Status: DC | PRN

## 2023-11-23 MED ORDER — DROPERIDOL 2.5 MG/ML IJ SOLN: 0.6250 mg | Freq: Once | INTRAMUSCULAR | Status: DC | PRN

## 2023-11-23 MED ORDER — ONDANSETRON HCL 4 MG/2ML IJ SOLN: 4.0000 mg | Freq: Four times a day (QID) | INTRAMUSCULAR | Status: DC | PRN

## 2023-11-23 MED ORDER — LIDOCAINE HCL (PF) 2 % IJ SOLN
INTRAMUSCULAR | Status: AC
Start: 2023-11-23 — End: 2023-11-23
  Filled 2023-11-23: qty 5

## 2023-11-23 MED ORDER — PHENYLEPHRINE 80 MCG/ML (10ML) SYRINGE FOR IV PUSH (FOR BLOOD PRESSURE SUPPORT)
PREFILLED_SYRINGE | INTRAVENOUS | Status: DC | PRN
Start: 2023-11-23 — End: 2023-11-23
  Administered 2023-11-23: 160 ug via INTRAVENOUS
  Administered 2023-11-23 (×2): 80 ug via INTRAVENOUS
  Administered 2023-11-23 (×2): 160 ug via INTRAVENOUS

## 2023-11-23 MED ORDER — KETOROLAC TROMETHAMINE 15 MG/ML IJ SOLN
15.0000 mg | Freq: Four times a day (QID) | INTRAMUSCULAR | Status: DC | PRN
Start: 2023-11-23 — End: 2023-11-28
  Administered 2023-11-23: 15 mg via INTRAVENOUS
  Filled 2023-11-23: qty 1

## 2023-11-23 MED ORDER — DEXAMETHASONE SODIUM PHOSPHATE 10 MG/ML IJ SOLN
INTRAMUSCULAR | Status: DC | PRN
  Administered 2023-11-23: 10 mg via INTRAVENOUS

## 2023-11-23 MED ORDER — CEFAZOLIN SODIUM-DEXTROSE 2-4 GM/100ML-% IV SOLN
INTRAVENOUS | Status: AC
Start: 2023-11-23 — End: 2023-11-23
  Filled 2023-11-23: qty 100

## 2023-11-23 MED ORDER — BUPIVACAINE LIPOSOME 1.3 % IJ SUSP
INTRAMUSCULAR | Status: DC | PRN
Start: 2023-11-23 — End: 2023-11-23

## 2023-11-23 MED ORDER — ONDANSETRON HCL 4 MG PO TABS: 4.0000 mg | ORAL_TABLET | Freq: Four times a day (QID) | ORAL | Status: DC | PRN

## 2023-11-23 MED ORDER — SUGAMMADEX SODIUM 200 MG/2ML IV SOLN
INTRAVENOUS | Status: DC | PRN
  Administered 2023-11-23: 200 mg via INTRAVENOUS

## 2023-11-23 MED ORDER — FENTANYL CITRATE (PF) 100 MCG/2ML IJ SOLN
INTRAMUSCULAR | Status: AC
  Filled 2023-11-23: qty 2

## 2023-11-23 MED ORDER — ACETAMINOPHEN 10 MG/ML IV SOLN
INTRAVENOUS | Status: DC | PRN
  Administered 2023-11-23: 1000 mg via INTRAVENOUS

## 2023-11-23 MED ORDER — FENTANYL CITRATE (PF) 100 MCG/2ML IJ SOLN: 25.0000 ug | INTRAMUSCULAR | Status: DC | PRN

## 2023-11-23 MED ORDER — LACTATED RINGERS IV SOLN
INTRAVENOUS | Status: DC | PRN
Start: 2023-11-23 — End: 2023-11-23

## 2023-11-23 MED ORDER — LEVOTHYROXINE SODIUM 88 MCG PO TABS
88.0000 ug | ORAL_TABLET | Freq: Every day | ORAL | Status: DC
  Administered 2023-11-23 – 2023-11-24 (×2): 88 ug via ORAL
  Filled 2023-11-23 (×3): qty 1

## 2023-11-23 MED ORDER — ROCURONIUM BROMIDE 10 MG/ML (PF) SYRINGE
PREFILLED_SYRINGE | INTRAVENOUS | Status: AC
  Filled 2023-11-23: qty 10

## 2023-11-23 MED ORDER — BUPIVACAINE HCL (PF) 0.5 % IJ SOLN
INTRAMUSCULAR | Status: DC | PRN
Start: 2023-11-23 — End: 2023-11-23
  Administered 2023-11-23 (×2): 10 mL via PERINEURAL

## 2023-11-23 MED ORDER — ROCURONIUM BROMIDE 100 MG/10ML IV SOLN
INTRAVENOUS | Status: DC | PRN
  Administered 2023-11-23: 40 mg via INTRAVENOUS
  Administered 2023-11-23: 20 mg via INTRAVENOUS

## 2023-11-23 MED ORDER — ONDANSETRON HCL 4 MG/2ML IJ SOLN
INTRAMUSCULAR | Status: DC | PRN
Start: 2023-11-23 — End: 2023-11-23
  Administered 2023-11-23: 4 mg via INTRAVENOUS

## 2023-11-23 MED ORDER — ACETAMINOPHEN 10 MG/ML IV SOLN: 1000.0000 mg | Freq: Once | INTRAVENOUS | Status: DC | PRN

## 2023-11-23 MED ORDER — ONDANSETRON HCL 4 MG/2ML IJ SOLN
INTRAMUSCULAR | Status: AC
  Filled 2023-11-23: qty 2

## 2023-11-23 MED ORDER — PROPOFOL 10 MG/ML IV BOLUS
INTRAVENOUS | Status: DC | PRN
  Administered 2023-11-23: 200 mg via INTRAVENOUS

## 2023-11-23 MED ORDER — MIDAZOLAM HCL 2 MG/2ML IJ SOLN
INTRAMUSCULAR | Status: AC
  Filled 2023-11-23: qty 2

## 2023-11-23 MED ORDER — OXYCODONE HCL 5 MG PO TABS
5.0000 mg | ORAL_TABLET | ORAL | Status: DC | PRN
  Administered 2023-11-23: 5 mg via ORAL
  Filled 2023-11-23: qty 1

## 2023-11-23 MED ORDER — 0.9 % SODIUM CHLORIDE (POUR BTL) OPTIME
TOPICAL | Status: DC | PRN
  Administered 2023-11-23: 900 mL

## 2023-11-23 MED ORDER — CEFAZOLIN SODIUM-DEXTROSE 2-4 GM/100ML-% IV SOLN
2.0000 g | INTRAVENOUS | Status: AC
  Administered 2023-11-23: 2 g via INTRAVENOUS

## 2023-11-23 MED ORDER — LIDOCAINE HCL (CARDIAC) PF 100 MG/5ML IV SOSY
PREFILLED_SYRINGE | INTRAVENOUS | Status: DC | PRN
Start: 2023-11-23 — End: 2023-11-23
  Administered 2023-11-23: 100 mg via INTRAVENOUS

## 2023-11-23 SURGICAL SUPPLY — 73 items
BASIN KIT SINGLE STR (MISCELLANEOUS) IMPLANT
BENZOIN TINCTURE PRP APPL 2/3 (GAUZE/BANDAGES/DRESSINGS) IMPLANT
BIT DRILL 2.0X130 SLD AO (BIT) IMPLANT
BIT DRILL CANN LONG 4.6X220 (DRILL) IMPLANT
BIT DRILL CANNULTD 2.6 X 130MM (DRILL) IMPLANT
BLADE SURG 15 STRL LF DISP TIS (BLADE) IMPLANT
BNDG COHESIVE 4X5 TAN STRL LF (GAUZE/BANDAGES/DRESSINGS) ×1 IMPLANT
BNDG ELASTIC 4X5.8 VLCR NS LF (GAUZE/BANDAGES/DRESSINGS) ×1 IMPLANT
BNDG ESMARCH 4X12 STRL LF (GAUZE/BANDAGES/DRESSINGS) ×1 IMPLANT
BNDG GAUZE DERMACEA FLUFF 4 (GAUZE/BANDAGES/DRESSINGS) ×1 IMPLANT
BNDG STRETCH GAUZE 3IN X12FT (GAUZE/BANDAGES/DRESSINGS) ×1 IMPLANT
CUFF TOURN SGL QUICK 18X4 (TOURNIQUET CUFF) IMPLANT
CUFF TOURN SGL QUICK 30 NS (TOURNIQUET CUFF) IMPLANT
CUFF TRNQT CYL 24X4X16.5-23 (TOURNIQUET CUFF) IMPLANT
DRAPE C-ARM XRAY 36X54 (DRAPES) ×1 IMPLANT
DRAPE C-ARMOR (DRAPES) ×1 IMPLANT
DRAPE EXTREMITY 106X87X128.5 (DRAPES) IMPLANT
DRAPE IMP U-DRAPE 54X76 (DRAPES) IMPLANT
DRSG XEROFORM 1X8 (GAUZE/BANDAGES/DRESSINGS) IMPLANT
DURAPREP 26ML APPLICATOR (WOUND CARE) ×1 IMPLANT
ELECTRODE REM PT RTRN 9FT ADLT (ELECTROSURGICAL) ×1 IMPLANT
GAUZE SPONGE 4X4 12PLY STRL (GAUZE/BANDAGES/DRESSINGS) ×1 IMPLANT
GAUZE STRETCH 2X75IN STRL (MISCELLANEOUS) ×1 IMPLANT
GAUZE XEROFORM 1X8 LF (GAUZE/BANDAGES/DRESSINGS) ×1 IMPLANT
GLOVE BIO SURGEON STRL SZ7 (GLOVE) ×1 IMPLANT
GLOVE BIOGEL PI IND STRL 6.5 (GLOVE) IMPLANT
GLOVE INDICATOR 7.0 STRL GRN (GLOVE) ×1 IMPLANT
GLOVE SURG SYN 6.5 PF PI (GLOVE) IMPLANT
GOWN STRL REUS W/ TWL LRG LVL3 (GOWN DISPOSABLE) ×3 IMPLANT
KIT TURNOVER KIT A (KITS) ×1 IMPLANT
KWIRE SMOOTH 1.6X150MM (WIRE) IMPLANT
KWIRE SNGL END 1.2X150 (MISCELLANEOUS) IMPLANT
LABEL OR SOLS (LABEL) ×1 IMPLANT
MANIFOLD NEPTUNE II (INSTRUMENTS) ×1 IMPLANT
NDL HYPO 22X1.5 SAFETY MO (MISCELLANEOUS) ×1 IMPLANT
NEEDLE HYPO 22X1.5 SAFETY MO (MISCELLANEOUS) ×1 IMPLANT
NS IRRIG 500ML POUR BTL (IV SOLUTION) ×1 IMPLANT
PACK EXTREMITY ARMC (MISCELLANEOUS) ×1 IMPLANT
PAD ABD DERMACEA PRESS 5X9 (GAUZE/BANDAGES/DRESSINGS) ×2 IMPLANT
PAD PREP OB/GYN DISP 24X41 (PERSONAL CARE ITEMS) ×1 IMPLANT
PENCIL SMOKE EVACUATOR (MISCELLANEOUS) ×1 IMPLANT
PLATE 9H LT POST FIB (Plate) IMPLANT
PLATE DIST TIB GORILLA 5H LT (Plate) IMPLANT
SCREW 4.0 X 40 LT (Screw) IMPLANT
SCREW 4.0 X 44 LT (Screw) IMPLANT
SCREW COUNTERSINK 4.0 HEADED (MISCELLANEOUS) IMPLANT
SCREW LOCK PLATE R3 2.7X10 (Screw) IMPLANT
SCREW LOCK PLATE R3 2.7X12 (Screw) IMPLANT
SCREW LOCK PLATE R3 2.7X13 (Screw) IMPLANT
SCREW LOCK PLATE R3 3.5X24 (Screw) IMPLANT
SCREW LOCK PLATE R3 3.5X30 (Screw) IMPLANT
SCREW LOCK PLATE R3 3.5X32 (Screw) IMPLANT
SCREW LOCK PLATE R3 3.5X34 (Screw) IMPLANT
SCREW NL 12X2.7XNS GORILLA R (Screw) IMPLANT
SPLINT CAST 1 STEP 4X30 (MISCELLANEOUS) ×1 IMPLANT
SPLINT PLASTER CAST FAST 5X30 (CAST SUPPLIES) ×1 IMPLANT
SPONGE T-LAP 18X18 ~~LOC~~+RFID (SPONGE) ×1 IMPLANT
STAPLER SKIN PROX 35W (STAPLE) ×1 IMPLANT
STOCKINETTE IMPERV 14X48 (MISCELLANEOUS) IMPLANT
STOCKINETTE M/LG 89821 (MISCELLANEOUS) ×1 IMPLANT
STRAP SAFETY 5IN WIDE (MISCELLANEOUS) ×1 IMPLANT
STRIP CLOSURE SKIN 1/2X4 (GAUZE/BANDAGES/DRESSINGS) IMPLANT
SUCTION TUBE FRAZIER 10FR DISP (SUCTIONS) IMPLANT
SUCTION TUBE FRAZIER 12FR DISP (SUCTIONS) IMPLANT
SUT VIC AB 2-0 CT1 TAPERPNT 27 (SUTURE) ×1 IMPLANT
SUT VIC AB 3-0 SH 27X BRD (SUTURE) ×1 IMPLANT
SYR 10ML LL (SYRINGE) ×1 IMPLANT
SYR 50ML LL SCALE MARK (SYRINGE) ×1 IMPLANT
TRAP FLUID SMOKE EVACUATOR (MISCELLANEOUS) ×1 IMPLANT
TUBING CONNECTING 10 (TUBING) IMPLANT
WATER STERILE IRR 500ML POUR (IV SOLUTION) ×1 IMPLANT
WIRE OLIVE GUIDE TOWER (WIRE) IMPLANT
WIRE OLIVE SMOOTH 1.4MMX60MM (WIRE) IMPLANT

## 2023-11-23 NOTE — H&P (Signed)
 History and Physical    Patient: Katrina [name] FMW:[id number] DOB: October 26, 1950 DOA: 11/22/2023 DOS: the patient was seen and examined on 11/23/2023 PCP: Randeen Laine LABOR, MD  Patient coming from: Home  Chief Complaint:  Chief Complaint  Patient presents with   Ankle Pain    left    HPI: Katrina [name] is a 73 y.o. female with medical history significant for HLD and hypothyroidism being admitted with a left trimalleolar fracture that she sustained when she twisted her ankle going downstairs after missing a step.  She had immediate onset of pain.  She was previously in her usual state of health and denied preceding lightheadedness, visual disturbance, headache, one-sided weakness numbness tingling, chest pain, palpitations or shortness of breath. In the ED vitals within normal limits and labs including CBC and differential unremarkable. CT ankle showing trimalleolar fracture.  X-ray hip nonacute. The ED provider spoke with podiatrist, Dr. Lennie who will take patient to the OR later today. Patient treated with fentanyl  and oxycodone  for pain, underwent reduction of the ankle under conscious sedation in the ED. Admission requested     Review of Systems: As mentioned in the history of present illness. All other systems reviewed and are negative.  Past Medical History:  Diagnosis Date   Actinic keratosis    Allergic reaction caused by a drug 03/27/2023   Asthma    Cataract 2022   Have had them removed in March 2024   Heel spur    HLD (hyperlipidemia)    Hypothyroid    Osteopenia    stress fracture in foot   Osteoporosis    Osteopenia   Plantar fasciitis    Stress fracture of foot    Past Surgical History:  Procedure Laterality Date   CHONDROPLASTY Right 07/12/2021   Procedure: CHONDROPLASTY;  Surgeon: Tobie Priest, MD;  Location: [facility] ORS;  Service: Orthopedics;  Laterality: Right;   COLONOSCOPY  2020   EYE SURGERY  March 2024   Cataract removal on both eyes   KNEE  ARTHROSCOPY WITH MEDIAL MENISECTOMY Right 07/12/2021   Procedure: Right knee arthroscopic medial meniscus root repair;  Surgeon: Tobie Priest, MD;  Location: [facility] ORS;  Service: Orthopedics;  Laterality: Right;   KNEE ARTHROSCOPY WITH MENISCAL REPAIR Left 05/02/2020   Procedure: Left medial meniscus root repair and subchondroplasty of medial plateau with possible chondroplasty of the patella - Krystal Doyne to Assist;  Surgeon: Tobie Priest, MD;  Location: [facility] ORS;  Service: Orthopedics;  Laterality: Left;   WISDOM TOOTH EXTRACTION     Social History:  reports that she has never smoked. She has never used smokeless tobacco. She reports that she does not currently use alcohol. She reports that she does not use drugs.  Allergies  Allergen Reactions   Advil [Ibuprofen]     Not an allergy but feels awful the next day, lethargic. Still takes it from time-to-time.   Amoxicillin -Pot Clavulanate Hives    Chest pain, itchy hives - likely clavulanate component as has tolerated plain amoxicillin  in the past   Clindamycin/Lincomycin Other (See Comments)    Caused C-diff   Codeine Nausea And Vomiting   Crestor  [Rosuvastatin ]     Muscle pain    Family History  Problem Relation Age of Onset   Myelodysplastic syndrome Father    Hypertension Mother    Thyroid  disease Mother    Hearing loss Mother    Hyperlipidemia Mother    Intellectual disability Mother    Stroke Mother    Vision  loss Mother    Cancer Other        GM--gallbladder   Lung cancer Other        GF   Colon cancer Other        GF   Osteoporosis Other        GM   Asthma Daughter    Breast cancer Neg Hx     Prior to Admission medications   Medication Sig Start Date End Date Taking? Authorizing Provider  albuterol  (VENTOLIN  HFA) 108 (90 Base) MCG/ACT inhaler Inhale 2 puffs into the lungs every 4 (four) hours as needed for wheezing. 01/26/23   Tower, Laine LABOR, MD  Calcium -Magnesium-Zinc (CAL-MAG-ZINC PO) Take 1 tablet by mouth  daily with lunch.    [provider]  Cholecalciferol (VITAMIN D -3) 125 MCG (5000 UT) TABS Take 5,000 Units by mouth daily with lunch.    [provider]  COLLAGEN PO Take 2 Scoops by mouth daily.    [provider]  ezetimibe  (ZETIA ) 10 MG tablet TAKE 1 TABLET BY MOUTH AT BEDTIME 04/02/23   Tower, Laine LABOR, MD  fluticasone (FLONASE) 50 MCG/ACT nasal spray Place 1 spray into both nostrils daily.    [provider]  levothyroxine  (SYNTHROID ) 88 MCG tablet TAKE 1 TABLET BY MOUTH ONCE DAILY BEFORE BREAKFAST 04/02/23   Tower, Laine LABOR, MD  Multiple Vitamin (MULTIVITAMIN WITH MINERALS) TABS tablet Take 1 tablet by mouth daily with lunch.    [provider]  vitamin C (ASCORBIC ACID) 500 MG tablet Take 500 mg by mouth daily with lunch.    [provider]    Physical Exam: Vitals:   11/22/23 2340 11/22/23 2343 11/22/23 2345 11/22/23 2350  BP: 117/77 118/78 118/78 (!) 125/97  Pulse: 68 64 65 73  Resp: 15 16 19 19   Temp:  98.1 F (36.7 C)    TempSrc:      SpO2: 95% 97% 97% 99%  Weight:      Height:       Physical Exam Vitals and nursing note reviewed.  Constitutional:      General: She is not in acute distress. HENT:     Head: Normocephalic and atraumatic.  Cardiovascular:     Rate and Rhythm: Normal rate and regular rhythm.     Heart sounds: Normal heart sounds.  Pulmonary:     Effort: Pulmonary effort is normal.     Breath sounds: Normal breath sounds.  Abdominal:     Palpations: Abdomen is soft.     Tenderness: There is no abdominal tenderness.  Musculoskeletal:     Comments: Left lower extremity in splint  Neurological:     Mental Status: Mental status is at baseline.     Labs on Admission: I have personally reviewed following labs and imaging studies  CBC: Recent Labs  Lab 11/23/23 0016  WBC 7.5  NEUTROABS 5.3  HGB 13.0  HCT 39.4  MCV 96.6  PLT 247   Basic Metabolic Panel: Recent Labs  Lab 11/23/23 0016  NA  142  K 4.0  CL 106  CO2 26  GLUCOSE 115*  BUN 21  CREATININE 0.97  CALCIUM  8.5*   GFR: Estimated Creatinine Clearance: 54.2 mL/min (by C-G formula based on SCr of 0.97 mg/dL). Liver Function Tests: No results for input(s): AST, ALT, ALKPHOS, BILITOT, PROT, ALBUMIN in the last 168 hours. No results for input(s): LIPASE, AMYLASE in the last 168 hours. No results for input(s): AMMONIA in the last 168 hours. Coagulation Profile:  No results for input(s): INR, PROTIME in the last 168 hours. Cardiac Enzymes: No results for input(s): CKTOTAL, CKMB, CKMBINDEX, TROPONINI in the last 168 hours. BNP (last 3 results) No results for input(s): PROBNP in the last 8760 hours. HbA1C: No results for input(s): HGBA1C in the last 72 hours. CBG: No results for input(s): GLUCAP in the last 168 hours. Lipid Profile: No results for input(s): CHOL, HDL, LDLCALC, TRIG, CHOLHDL, LDLDIRECT in the last 72 hours. Thyroid  Function Tests: No results for input(s): TSH, T4TOTAL, FREET4, T3FREE, THYROIDAB in the last 72 hours. Anemia Panel: No results for input(s): VITAMINB12, FOLATE, FERRITIN, TIBC, IRON, RETICCTPCT in the last 72 hours. Urine analysis: No results found for: COLORURINE, APPEARANCEUR, LABSPEC, PHURINE, GLUCOSEU, HGBUR, BILIRUBINUR, KETONESUR, PROTEINUR, UROBILINOGEN, NITRITE, LEUKOCYTESUR  Radiological Exams on Admission: DG Hip Unilat W or Wo Pelvis 1 View Left Result Date: 11/23/2023 CLINICAL DATA:  Pain after fall EXAM: DG HIP (WITH OR WITHOUT PELVIS) 1V*L* COMPARISON:  None Available. FINDINGS: No acute fracture or dislocation. Degenerative changes pubic symphysis, both hips, SI joints and lower lumbar spine. IMPRESSION: No acute fracture or dislocation. Electronically Signed   By: Norman Gatlin M.D.   On: 11/23/2023 00:27   CT Ankle Left Wo Contrast Result Date: 11/23/2023 CLINICAL DATA:   Postreduction, EXAM: CT OF THE LEFT ANKLE WITHOUT CONTRAST TECHNIQUE: Multidetector CT imaging of the left ankle was performed according to the standard protocol. Multiplanar CT image reconstructions were also generated. RADIATION DOSE REDUCTION: This exam was performed according to the departmental dose-optimization program which includes automated exposure control, adjustment of the mA and/or kV according to patient size and/or use of iterative reconstruction technique. COMPARISON:  Same day ankle radiographs FINDINGS: Bones/Joint/Cartilage Comminuted transverse fracture of the distal fibular diaphysis extending into the tibiofibular syndesmosis 3.6 cm above the ankle mortise. There is 4 mm of lateral displacement of the distal fragment. 3 mm of inferior displacement of the medial malleolus fracture. The posterior malleolus fracture is displaced posteriorly 2 mm. No dislocation. Ligaments Suboptimally assessed by CT. Muscles and Tendons No acute abnormality. Soft tissues Soft tissue swelling about the ankle. IMPRESSION: Trimalleolar fracture of the left ankle.  No dislocation. Electronically Signed   By: Norman Gatlin M.D.   On: 11/23/2023 00:22   DG Ankle 2 Views Left Result Date: 11/23/2023 CLINICAL DATA:  Postreduction EXAM: LEFT ANKLE - 2 VIEW COMPARISON:  11/22/2023 FINDINGS: In splint views of the left ankle demonstrate interval reduction of the previously seen tibiotalar dislocation. Trimalleolar fracture again noted continued displacement. Angulation of the distal fibular fracture. IMPRESSION: Trimalleolar fracture with continued displacement and angulation of fracture fragments. Reduction of the previously seen dislocation. Electronically Signed   By: Franky Crease M.D.   On: 11/23/2023 00:10   DG Ankle Complete Left Result Date: 11/22/2023 CLINICAL DATA:  Tripped and landed on bottom.  Pain in left ankle. EXAM: LEFT ANKLE COMPLETE - 3+ VIEW COMPARISON:  None Available. FINDINGS: Acute oblique  fracture through the distal left fibular metadiaphysis. The fracture line extends into the tibiofibular syndesmosis 2.4 cm above the ankle mortise. Apex medial angulation. Mildly displaced medial malleolar fracture. Displaced angulated fracture of the posterior malleolus. Apex anterior angulation. Probable nondisplaced fracture of the lateral process of the talus. Posterior dislocation of the talus in relation to the distal tibia by 1 full shaft width. Large ankle joint effusion. Soft tissue swelling about the ankle. IMPRESSION: Trimalleolar fracture dislocation of the left ankle. Electronically Signed   By: Norman Gatlin HERO.D.  On: 11/22/2023 21:00   Data Reviewed for HPI: Relevant notes from primary care and specialist visits, past discharge summaries as available in EHR, including Care Everywhere. Prior diagnostic testing as pertinent to current admission diagnoses Updated medications and problem lists for reconciliation ED course, including vitals, labs, imaging, treatment and response to treatment Triage notes, nursing and pharmacy notes and ED provider's notes Notable results as noted above in HPI      Assessment and Plan: * Trimalleolar fracture of left ankle, closed, initial encounter N.p.o. from 5 AM 4 OR later this morning Pain control Formal consult to podiatry, Dr. Lennie   Hyperlipidemia Continue Zetia   Hypothyroid Continue levothyroxine         DVT prophylaxis: SCD  Consults: Podiatry, Dr. Lennie  Advance Care Planning: full code  Family Communication: Husband at bedside  Disposition Plan: Back to previous home environment  Severity of Illness: The appropriate patient status for this patient is OBSERVATION. Observation status is judged to be reasonable and necessary in order to provide the required intensity of service to ensure the patient's safety. The patient's presenting symptoms, physical exam findings, and initial radiographic and laboratory data in the  context of their medical condition is felt to place them at decreased risk for further clinical deterioration. Furthermore, it is anticipated that the patient will be medically stable for discharge from the hospital within 2 midnights of admission.   Author: Delayne LULLA Solian, MD 11/23/2023 1:51 AM  For on call review [email or web address].

## 2023-11-23 NOTE — Anesthesia Preprocedure Evaluation (Addendum)
 Anesthesia Evaluation  Patient identified by MRN, date of birth, ID band Patient awake    Reviewed: Allergy & Precautions, H&P , NPO status , Patient's Chart, lab work & pertinent test results  Airway Mallampati: II  TM Distance: >3 FB Neck ROM: full    Dental no notable dental hx.    Pulmonary asthma    Pulmonary exam normal        Cardiovascular negative cardio ROS Normal cardiovascular exam     Neuro/Psych negative neurological ROS  negative psych ROS   GI/Hepatic negative GI ROS, Neg liver ROS,,,  Endo/Other  Hypothyroidism    Renal/GU      Musculoskeletal   Abdominal   Peds  Hematology negative hematology ROS (+)   Anesthesia Other Findings Past Medical History: No date: Actinic keratosis 03/27/2023: Allergic reaction caused by a drug No date: Asthma 2022: Cataract     Comment:  Have had them removed in March 2024 No date: Heel spur No date: HLD (hyperlipidemia) No date: Hypothyroid No date: Osteopenia     Comment:  stress fracture in foot No date: Osteoporosis     Comment:  Osteopenia No date: Plantar fasciitis No date: Stress fracture of foot  Past Surgical History: 07/12/2021: CHONDROPLASTY; Right     Comment:  Procedure: CHONDROPLASTY;  Surgeon: Tobie Priest, MD;                Location: [facility] ORS;  Service: Orthopedics;  Laterality:               Right; 2020: COLONOSCOPY March 2024: EYE SURGERY     Comment:  Cataract removal on both eyes 07/12/2021: KNEE ARTHROSCOPY WITH MEDIAL MENISECTOMY; Right     Comment:  Procedure: Right knee arthroscopic medial meniscus root               repair;  Surgeon: Tobie Priest, MD;  Location: [facility] ORS;               Service: Orthopedics;  Laterality: Right; 05/02/2020: KNEE ARTHROSCOPY WITH MENISCAL REPAIR; Left     Comment:  Procedure: Left medial meniscus root repair and               subchondroplasty of medial plateau with possible                chondroplasty of the patella - Krystal Doyne to Assist;                Surgeon: Tobie Priest, MD;  Location: [facility] ORS;  Service:              Orthopedics;  Laterality: Left; No date: WISDOM TOOTH EXTRACTION  BMI    Body Mass Index: 25.82 kg/m      Reproductive/Obstetrics negative OB ROS                              Anesthesia Physical Anesthesia Plan  ASA: 2  Anesthesia Plan: General ETT   Post-op Pain Management: Regional block*   Induction: Intravenous  PONV Risk Score and Plan: 2 and Ondansetron , Dexamethasone  and Midazolam   Airway Management Planned: Oral ETT  Additional Equipment:   Intra-op Plan:   Post-operative Plan: Extubation in OR  Informed Consent: I have reviewed the patients History and Physical, chart, labs and discussed the procedure including the risks, benefits and alternatives for the proposed anesthesia with the patient or authorized representative who has indicated his/her understanding and acceptance.  Dental Advisory Given  Plan Discussed with: CRNA and Surgeon  Anesthesia Plan Comments:          Anesthesia Quick Evaluation

## 2023-11-23 NOTE — Assessment & Plan Note (Addendum)
On Zetia 

## 2023-11-23 NOTE — Anesthesia Postprocedure Evaluation (Signed)
 Anesthesia Post Note  Patient: Katrina [name]  Procedure(s) Performed: OPEN REDUCTION INTERNAL FIXATION (ORIF) ANKLE FRACTURE (Left: Ankle)  Patient location during evaluation: PACU Anesthesia Type: General Level of consciousness: awake and alert Pain management: pain level controlled Vital Signs Assessment: post-procedure vital signs reviewed and stable Respiratory status: spontaneous breathing, nonlabored ventilation and respiratory function stable Cardiovascular status: blood pressure returned to baseline and stable Postop Assessment: no apparent nausea or vomiting Anesthetic complications: no   No notable events documented.   Last Vitals:  Vitals:   11/23/23 2109 11/23/23 2330  BP: 104/70 (!) 96/58  Pulse: 65 (!) 58  Resp: 18 16  Temp: 36.6 C 36.5 C  SpO2: 92% 96%    Last Pain:  Vitals:   11/23/23 2330  TempSrc: Oral  PainSc:                  Camellia Merilee Louder

## 2023-11-23 NOTE — Assessment & Plan Note (Addendum)
-  On levothyroxine

## 2023-11-23 NOTE — Op Note (Signed)
 PODIATRY / FOOT AND ANKLE SURGERY OPERATIVE REPORT    SURGEON: Prentice Lee, DPM  ASSISTANT: Eva Gay, DPM, Amy Wilson, DPM  PRE-OPERATIVE DIAGNOSIS:  Left trimal ankle fracture, closed, displaced  POST-OPERATIVE DIAGNOSIS: same  PROCEDURE(S): Left trimalleolar ankle fracture open reduction with internal fixation  HEMOSTASIS: Left thigh tourniquet  ANESTHESIA: general  ESTIMATED BLOOD LOSS: 40 cc  FINDING(S): 1.  Comminuted distal fibula fracture, displaced transverse 2.  Comminuted posterior malleolar fracture 3.  Medial malleolar fracture  PATHOLOGY/SPECIMEN(S): None  INDICATIONS:   Katrina [name] is a 73 y.o. female who presents with pain to the left ankle after sustaining a fall yesterday.  Patient went to the emergency room was noted to have a trimalleolar ankle fracture in which close reduction was performed.  CT and x-ray imaging was performed showing improvement but still dislocation. All treatment options were discussed with the patient of both conservative and surgical attempts at correction including potential risks and complications.  Patient has elected for procedure consisting of left trimalleolar ankle fracture open reduction with internal fixation.  No guarantees given.  Consent obtained. .  DESCRIPTION: After obtaining full informed written consent, the patient was brought back to the operating room and placed prone upon the operating table.  The patient received IV antibiotics prior to induction.  After obtaining adequate anesthesia, the patient was prepped and draped in the standard fashion for: Left trimalleolar ankle fracture open reduction with internal fixation.  A popliteal and saphenous nerve block was performed by anesthesia preoperatively.  Attention was directed to the interval between the Achilles tendon and the fibula where an incision was made slightly more cheating over to the fibular side.  The incision was made over this area and deepened to  the subcutaneous tissues utilizing sharp and blunt dissection and care was taken to identify and retract all vital neurovascular structures and all venous contributories were cauterized as necessary.  At this time the fascia overlying the peroneal muscle bellies was identified and transected.  The peroneal tendons were then retracted laterally and the deep fascia overlying the flexor hallucis longus tendon was also identified and the fascia was then transected as well.  The FHL muscle belly was then retracted medially thereby exposing the posterior aspect of the ankle.  Blunt dissection was continued to remove the muscle belly off of the back of the tibia.  At this time a small periosteal incision was made at the posterior ankle and the periosteum was reflected medially and laterally about expose the posterior ankle joint.  There appeared to be a slightly comminuted posterior malleolar fracture that was present.  The fracture was opened up further and debridement was performed removing some anterior positional bone chips which were removed.  After this was removed the posterior malleolus was then held into reduced position.  A K wire was then placed across the main fragment of the posterior malleolus from posterior to anterior.  This was then checked under fluoroscopic guidance.  Reduction appeared to be adequate overall and in near anatomic alignment at the posterior tibia.  At this time a 5 hole Paragon 28 posterior tibial locking plate was applied.  2 to 3.5 mm locking screws were placed through the fracture fragment through the distal holes of the plate utilizing standard AO principles and techniques.  2 to 3.5 mm locking screws then placed in the tibia more proximally.  This appeared to lock the posterior malleolar fracture fragment into the near anatomic position overall.  Attention was then directed  more laterally where at this time the peroneal tendons were retracted medially.  The peroneal tendons were  partially removed off the posterior surface of the tibia to be able to visualize the fracture present.  The fracture appeared to be comminuted and there also appeared to be posterior and proximal displacement.  Some of the periosteum was resected off the fibula for further visualization of the area.  At this time the reduction clamps were then used to reduce the fracture back into near anatomic position.  Once near-anatomic position C-arm imaging was utilized to verify correct position which appeared to be excellent overall.  At this time a Paragon 28 posterior fibular locking plate was then applied and held with temporary fixation with all of wires.  This was a 9 hole locking plate.  This was checked under fluoroscopic guidance and appeared to have good seating overall.  The fibula appeared to be at the length and near anatomic position.  The ankle appeared to be mostly well aligned at this point and in near anatomic position but still the medial malleolar fracture fragment was able to be visualized.  Appeared to be minimally displaced at this time.  4 locking screws were then placed distal to the fracture site 2.7 mm utilizing standard AO principles and techniques.  2 locking screws were also placed proximal to the fracture site and a nonlocking screw was also placed proximal to the fracture site to add further compression utilizing standard AO principles and techniques, all were 2.7 mm.  C-arm imaging was utilized to verify correct position which appeared to be excellent overall on the AP, mortise, and lateral views.  A flush was performed with copious amounts normal sterile saline.  At this time the periosteal structures reapproximated well coapted with 3-0 Vicryl.  The deep fascia was reapproximated well coapted with 3-0 Vicryl.  The subcutaneous tissue was reapproximated well coapted with 3-0 Vicryl.  The skin was then reapproximated well coapted with skin staples.  A dressing was then applied consisting of  Xeroform followed by 4 x 4 gauze, gauze roll, Ace wrap.  The thigh tourniquet was deflated and a prompt hyperemic response was noted all digits left foot.  The tourniquet was up for approximately 92 minutes.  The patient was then flipped into the supine position and sterile prep and draping was performed.  Esmarch bandage is used to exsanguinate the left lower extremity pneumatic thigh tourniquet was inflated.  Attention was directed to the medial malleolus where a linear incision was made slightly medial to the great saphenous vein overlying the medial gutter and medial malleolus.  The incision was deepened through the subcutaneous tissues utilizing sharp and blunt dissection and care was taken to identify and retract all vital neurovascular structures and all venous contributories were cauterized necessary.  At this time periosteal incision was made into the medial malleolus and the fracture fragment was able to be visualized as the periosteum was reflected medially and laterally thereby exposing the fracture site.  The fracture appeared to be slightly mall reduced.  At this time the medial malleolar fracture fragment was then brought into a reduced position and held with a temporary fixation wires.  These wires were also set to apply 4.0 partially-threaded cannulated screws from Paragon 28.  The wire placement was then done in such a way that the wires were placed parallel to 1 another and to slightly separate planes.  The medial malleolar piece appeared to be small but it was able to accommodate 2  screws.  Utilizing standard AO principles and techniques to 4.0 partially-threaded cannulated screws were then placed.  Excellent compression was noted and the wires were then removed.  The surgical site was flushed with copious amounts normal sterile saline.  The periosteal structures were reapproximated well coapted with 3-0 Vicryl, the subcutaneous tissue was reapproximated well coapted with Vicryl and the skin  was then approximated well coapted with skin staples.  The pneumatic thigh tourniquet was deflated and a prompt hyperemic response was noted to all digits left foot, approximately 37 minutes.  Postoperative dressing was applied consisting of Xeroform, 4 x 4 gauze, gauze roll, ABD, Kerlix, Webril, posterior splint, Ace wrap.  The patient tolerated the procedure and anesthesia well and was transferred to the recovery with vital signs stable and vascular status intact all toes left foot.  Following a period of postoperative monitoring the patient be discharged to the inpatient room with the appropriate orders, instructions, medications.  We will see patient again tomorrow and likely sign off for discharge at that time after she works with physical therapy and pain control was evaluated.  COMPLICATIONS: None  CONDITION: Good, stable  Prentice Lee, DPM

## 2023-11-23 NOTE — Anesthesia Procedure Notes (Signed)
 Anesthesia Regional Block: Popliteal block   Pre-Anesthetic Checklist: , timeout performed,  Correct Patient, Correct Site, Correct Laterality,  Correct Procedure, Correct Position, site marked,  Risks and benefits discussed,  Surgical consent,  Pre-op evaluation,  At surgeon's request and post-op pain management  Laterality: Left  Prep: chloraprep       Needles:  Injection technique: Single-shot  Needle Type: Stimiplex     Needle Length: 9cm  Needle Gauge: 22     Additional Needles:   Procedures:,,,, ultrasound used (permanent image in chart),,    Narrative:  Start time: 11/23/2023 4:33 PM End time: 11/23/2023 4:36 PM Injection made incrementally with aspirations every 5 mL.  Performed by: Personally  Anesthesiologist: Vicci Camellia Glatter, MD  Additional Notes: Patient consented for risk and benefits of nerve block including but not limited to nerve damage, failed block, bleeding and infection.  Patient voiced understanding.  Functioning IV was confirmed and monitors were applied.  Timeout done prior to procedure and prior to any sedation being given to the patient.  Patient confirmed procedure site prior to any sedation given to the patient. Sterile prep,hand hygiene and sterile gloves were used.  Minimal sedation used for procedure.  No paresthesia endorsed by patient during the procedure.  Negative aspiration and negative test dose prior to incremental administration of local anesthetic. The patient tolerated the procedure well with no immediate complications.

## 2023-11-23 NOTE — Discharge Instructions (Signed)
 [facility]  POST OPERATIVE INSTRUCTIONS FOR DR. ASHLEY AND DR. Iwalani Templeton [facility] PODIATRY DEPARTMENT   Take your medication as prescribed.  Pain medication should be taken only as needed.  Keep the dressing clean, dry and intact.  Remain nonweightbearing at all times the left lower extremity.  Keep your foot elevated above the heart level for the first 48 hours.  Continue elevation thereafter to improve swelling.  May also apply ice to the posterior left knee for maximum 10 minutes out of every 1 hour as needed for pain and swelling relief.  Remain nonweightbearing at all times left lower extremity.  Use crutches, knee scooter, or wheelchair to get around.  Today  Every hour you are awake:  Bend your knee 15 times. Massage calf 15 times  Call [facility] [phone]) if any of the following problems occur: You develop a temperature or fever. The bandage becomes saturated with blood. Medication does not stop your pain. Injury of the foot occurs. Any symptoms of infection including redness, odor, or red streaks running from wound.

## 2023-11-23 NOTE — Care Management Obs Status (Signed)
 MEDICARE OBSERVATION STATUS NOTIFICATION   Patient Details  Name: Katrina [name] MRN: [id number] Date of Birth: [birth year]   Medicare Observation Status Notification Given:  Yes    Rojelio SHAUNNA Rattler 11/23/2023, 11:58 AM

## 2023-11-23 NOTE — Progress Notes (Signed)
  Progress Note   Patient: Katrina [name] FMW:[id number] DOB: 08-11-1950 DOA: 11/22/2023     0 DOS: the patient was seen and examined on 11/23/2023   Brief hospital course:  73 y.o. female with medical history significant for HLD and hypothyroidism being admitted with a left trimalleolar fracture that she sustained when she twisted her ankle going downstairs after missing a step.  She had immediate onset of pain.  She was previously in her usual state of health and denied preceding lightheadedness, visual disturbance, headache, one-sided weakness numbness tingling, chest pain, palpitations or shortness of breath. In the ED vitals within normal limits and labs including CBC and differential unremarkable. CT ankle showing trimalleolar fracture.  X-ray hip nonacute. The ED provider spoke with podiatrist, Dr. Lennie who will take patient to the OR later today. Patient treated with fentanyl  and oxycodone  for pain, underwent reduction of the ankle under conscious sedation in the ED. Admission requested   8/18.  Patient feeling okay.  Some pain in her ankle.  Had a fall.  Assessment and Plan: * Fracture of ankle, trimalleolar, left, closed, sequela Dr. Lennie podiatry to take to the operating room today.  EKG reviewed by me shows normal sinus rhythm 61 bpm, no acute ST-T wave changes.  No contraindications to surgery at this time.   Hyperlipidemia On Zetia   Hypothyroid On levothyroxine         Subjective: Patient stated she had a fall.  Ended up fracturing her left ankle.  Podiatry to take to the operating room later on today.  Physical Exam: Vitals:   11/22/23 2350 11/23/23 0153 11/23/23 0217 11/23/23 0846  BP: (!) 125/97 103/67 113/73 117/69  Pulse: 73 (!) 57 (!) 56 (!) 57  Resp: 19 18 18 16   Temp:  98.3 F (36.8 C) 98 F (36.7 C) 97.8 F (36.6 C)  TempSrc:    Oral  SpO2: 99% 94% 94% 95%  Weight:      Height:       Physical Exam HENT:     Head: Normocephalic.  Eyes:      General: Lids are normal.     Conjunctiva/sclera: Conjunctivae normal.  Cardiovascular:     Rate and Rhythm: Normal rate and regular rhythm.     Heart sounds: Normal heart sounds, S1 normal and S2 normal.  Pulmonary:     Breath sounds: No decreased breath sounds, wheezing, rhonchi or rales.  Abdominal:     Palpations: Abdomen is soft.     Tenderness: There is no abdominal tenderness.  Musculoskeletal:     Right lower leg: No swelling.     Left lower leg: No swelling.  Skin:    General: Skin is warm.     Findings: No rash.  Neurological:     Mental Status: She is alert and oriented to person, place, and time.     Data Reviewed: EKG reviewed by me showed normal sinus rhythm 61 bpm no acute ST-T wave changes Platelets again 7.5, hemoglobin 13, platelet count 247, creatinine 0.97, electrolytes normal  Family Communication: Husband at bedside  Disposition: Status is: Observation Patient going to the operating room today  Planned Discharge Destination: Home    Time spent: 28 minutes  Author: Charlie Patterson, MD 11/23/2023 1:20 PM  For on call review [email or web address].

## 2023-11-23 NOTE — Anesthesia Procedure Notes (Signed)
 Anesthesia Regional Block: Adductor canal block   Pre-Anesthetic Checklist: , timeout performed,  Correct Patient, Correct Site, Correct Laterality,  Correct Procedure, Correct Position, site marked,  Risks and benefits discussed,  Surgical consent,  Pre-op evaluation,  At surgeon's request and post-op pain management  Laterality: Upper and Left  Prep: chloraprep       Needles:  Injection technique: Single-shot  Needle Type: Stimiplex     Needle Length: 9cm  Needle Gauge: 22     Additional Needles:   Procedures:,,,, ultrasound used (permanent image in chart),,    Narrative:  Start time: 11/23/2023 4:29 PM End time: 11/23/2023 4:33 PM Injection made incrementally with aspirations every 5 mL.  Performed by: Personally  Anesthesiologist: Vicci Camellia Glatter, MD  Additional Notes: Patient consented for risk and benefits of nerve block including but not limited to nerve damage, failed block, bleeding and infection.  Patient voiced understanding.  Functioning IV was confirmed and monitors were applied.  Timeout done prior to procedure and prior to any sedation being given to the patient.  Patient confirmed procedure site prior to any sedation given to the patient. Sterile prep,hand hygiene and sterile gloves were used.  Minimal sedation used for procedure.  No paresthesia endorsed by patient during the procedure.  Negative aspiration and negative test dose prior to incremental administration of local anesthetic. The patient tolerated the procedure well with no immediate complications.

## 2023-11-23 NOTE — Assessment & Plan Note (Addendum)
 Dr. Lennie podiatry took to the operating room on 8/18 for open reduction internal fixation.  Patient doing well postoperatively.  Did well with physical therapy.  Nonweightbearing left leg for 6 weeks.  Follow-up 1 week Dr. Lennie.  DVT prophylaxis with aspirin  81 mg twice a day for 42 days.  Prophylactic 1 week of antibiotic Keflex .  Pain control with oxycodone  28 tablets prescribed.

## 2023-11-23 NOTE — Anesthesia Procedure Notes (Signed)
 Procedure Name: Intubation Date/Time: 11/23/2023 4:52 PM  Performed by: Vicci Camellia Glatter, MDPre-anesthesia Checklist: Patient identified, Emergency Drugs available, Suction available and Patient being monitored Patient Re-evaluated:Patient Re-evaluated prior to induction Oxygen Delivery Method: Circle system utilized Preoxygenation: Pre-oxygenation with 100% oxygen Induction Type: IV induction Ventilation: Mask ventilation without difficulty Laryngoscope Size: McGrath and 3 Grade View: Grade I Tube type: Oral Tube size: 7.0 mm Number of attempts: 1 Airway Equipment and Method: Stylet and Oral airway Placement Confirmation: ETT inserted through vocal cords under direct vision, positive ETCO2 and breath sounds checked- equal and bilateral Secured at: 20 cm Tube secured with: Tape Dental Injury: Teeth and Oropharynx as per pre-operative assessment

## 2023-11-23 NOTE — Consult Note (Signed)
 PODIATRY / FOOT AND ANKLE SURGERY CONSULTATION NOTE  Requesting Physician: Dr. Nicholaus  Chief Complaint: Left ankle injury   HPI: Katrina [name] is a 73 y.o. female who presents with left ankle injury that occurred yesterday evening when she twisted her ankle down some stairs missed a step.  She notes that her ankle was pointing in a slightly different direction and was not able to bear weight.  Patient went to the emergency room and had closed reduction performed and x-ray imaging subsequently revealed trimalleolar ankle fracture that still appeared to be displaced despite closed reduction.  CT scan imaging was performed and podiatry was consulted for further evaluation in regards to surgical intervention.  Patient presents resting in bed fairly comfortably with some pain to left ankle.  Her splint appears to be intact today.  Patient has large amount of family at bedside.  PMHx:  Past Medical History:  Diagnosis Date   Actinic keratosis    Allergic reaction caused by a drug 03/27/2023   Asthma    Cataract 2022   Have had them removed in March 2024   Heel spur    HLD (hyperlipidemia)    Hypothyroid    Osteopenia    stress fracture in foot   Osteoporosis    Osteopenia   Plantar fasciitis    Stress fracture of foot     Surgical Hx:  Past Surgical History:  Procedure Laterality Date   CHONDROPLASTY Right 07/12/2021   Procedure: CHONDROPLASTY;  Surgeon: Tobie Priest, MD;  Location: [facility] OR[facility];  Service: Orthopedics;  Laterality: Right;   COLONOSCOPY  2020   EYE SURGERY  March 2024   Cataract removal on both eyes   KNEE ARTHROSCOPY WITH MEDIAL MENISECTOMY Right 07/12/2021   Procedure: Right knee arthroscopic medial meniscus root repair;  Surgeon: Tobie Priest, MD;  Location: [facility] OR[facility];  Service: Orthopedics;  Laterality: Right;   KNEE ARTHROSCOPY WITH MENISCAL REPAIR Left 05/02/2020   Procedure: Left medial meniscus root repair and subchondroplasty of medial plateau with possible  chondroplasty of the patella - Krystal Doyne to Assist;  Surgeon: Tobie Priest, MD;  Location: [facility];  Service: Orthopedics;  Laterality: Left;   WISDOM TOOTH EXTRACTION      FHx:  Family History  Problem Relation Age of Onset   Myelodysplastic syndrome Father    Hypertension Mother    Thyroid  disease Mother    Hearing loss Mother    Hyperlipidemia Mother    Intellectual disability Mother    Stroke Mother    Vision loss Mother    Cancer Other        GM--gallbladder   Lung cancer Other        GF   Colon cancer Other        GF   Osteoporosis Other        GM   Asthma Daughter    Breast cancer Neg Hx     Social History:  reports that she has never smoked. She has never used smokeless tobacco. She reports that she does not currently use alcohol. She reports that she does not use drugs.  Allergies:  Allergies  Allergen Reactions   Advil [Ibuprofen]     Not an allergy but feels awful the next day, lethargic. Still takes it from time-to-time.   Amoxicillin -Pot Clavulanate Hives    Chest pain, itchy hives - likely clavulanate component as has tolerated plain amoxicillin  in the past   Clindamycin/Lincomycin Other (See Comments)    Caused C-diff  Codeine Nausea And Vomiting   Crestor  [Rosuvastatin ]     Muscle pain   Medications Prior to Admission  Medication Sig Dispense Refill   albuterol  (VENTOLIN  HFA) 108 (90 Base) MCG/ACT inhaler Inhale 2 puffs into the lungs every 4 (four) hours as needed for wheezing. 1 each 5   calcium  carbonate (OSCAL) 1500 (600 Ca) MG TABS tablet Take 600 mg of elemental calcium  by mouth daily at 12 noon.     Cholecalciferol (VITAMIN D -3) 125 MCG (5000 UT) TABS Take 5,000 Units by mouth daily with lunch.     COLLAGEN PO Take 1 Scoop by mouth daily.     ezetimibe  (ZETIA ) 10 MG tablet TAKE 1 TABLET BY MOUTH AT BEDTIME 90 tablet 2   fluticasone (FLONASE) 50 MCG/ACT nasal spray Place 1 spray into both nostrils daily.     levothyroxine  (SYNTHROID ) 88 MCG  tablet TAKE 1 TABLET BY MOUTH ONCE DAILY BEFORE BREAKFAST 90 tablet 2   Multiple Vitamin (MULTIVITAMIN WITH MINERALS) TABS tablet Take 1 tablet by mouth daily with lunch.     vitamin C (ASCORBIC ACID) 500 MG tablet Take 500 mg by mouth daily with lunch.      Physical Exam: General: Alert and oriented.  No apparent distress.  Splint to the left lower extremity appears to be clean, dry, and intact, capillary fill time appears to be intact digits left foot, patient able to dorsiflex and plantarflex digits of left foot without pain or discomfort, light touch sensation intact to digits.  Results for orders placed or performed during the hospital encounter of 11/22/23 (from the past 48 hours)  Basic metabolic panel     Status: Abnormal   Collection Time: 11/23/23 12:16 AM  Result Value Ref Range   Sodium 142 135 - 145 mmol/L   Potassium 4.0 3.5 - 5.1 mmol/L   Chloride 106 98 - 111 mmol/L   CO2 26 22 - 32 mmol/L   Glucose, Bld 115 (H) 70 - 99 mg/dL    Comment: Glucose reference range applies only to samples taken after fasting for at least 8 hours.   BUN 21 8 - 23 mg/dL   Creatinine, Ser 9.02 0.44 - 1.00 mg/dL   Calcium  8.5 (L) 8.9 - 10.3 mg/dL   GFR, Estimated >39 >39 mL/min    Comment: (NOTE) Calculated using the CKD-EPI Creatinine Equation (2021)    Anion gap 10 5 - 15    Comment: Performed at [facility], [address] [place], [place] [address]  CBC with Differential     Status: None   Collection Time: 11/23/23 12:16 AM  Result Value Ref Range   WBC 7.5 4.0 - 10.5 K/uL   RBC 4.08 3.87 - 5.11 MIL/uL   Hemoglobin 13.0 12.0 - 15.0 g/dL   HCT 60.5 63.9 - 53.9 %   MCV 96.6 80.0 - 100.0 fL   MCH 31.9 26.0 - 34.0 pg   MCHC 33.0 30.0 - 36.0 g/dL   RDW 87.6 88.4 - 84.4 %   Platelets 247 150 - 400 K/uL   nRBC 0.0 0.0 - 0.2 %   Neutrophils Relative % 70 %   Neutro Abs 5.3 1.7 - 7.7 K/uL   Lymphocytes Relative 20 %   Lymphs Abs 1.5 0.7 - 4.0 K/uL   Monocytes Relative 8 %    Monocytes Absolute 0.6 0.1 - 1.0 K/uL   Eosinophils Relative 1 %   Eosinophils Absolute 0.1 0.0 - 0.5 K/uL   Basophils Relative 1 %  Basophils Absolute 0.1 0.0 - 0.1 K/uL   Immature Granulocytes 0 %   Abs Immature Granulocytes 0.01 0.00 - 0.07 K/uL    Comment: Performed at [facility], [address] [place], [place] [address]   DG Hip Burnis ORN or Wo Pelvis 1 View Left Result Date: 11/23/2023 CLINICAL DATA:  Pain after fall EXAM: DG HIP (WITH OR WITHOUT PELVIS) 1V*L* COMPARISON:  None Available. FINDINGS: No acute fracture or dislocation. Degenerative changes pubic symphysis, both hips, SI joints and lower lumbar spine. IMPRESSION: No acute fracture or dislocation. Electronically Signed   By: Norman Gatlin M.D.   On: 11/23/2023 00:27   CT Ankle Left Wo Contrast Result Date: 11/23/2023 CLINICAL DATA:  Postreduction, EXAM: CT OF THE LEFT ANKLE WITHOUT CONTRAST TECHNIQUE: Multidetector CT imaging of the left ankle was performed according to the standard protocol. Multiplanar CT image reconstructions were also generated. RADIATION DOSE REDUCTION: This exam was performed according to the departmental dose-optimization program which includes automated exposure control, adjustment of the mA and/or kV according to patient size and/or use of iterative reconstruction technique. COMPARISON:  Same day ankle radiographs FINDINGS: Bones/Joint/Cartilage Comminuted transverse fracture of the distal fibular diaphysis extending into the tibiofibular syndesmosis 3.6 cm above the ankle mortise. There is 4 mm of lateral displacement of the distal fragment. 3 mm of inferior displacement of the medial malleolus fracture. The posterior malleolus fracture is displaced posteriorly 2 mm. No dislocation. Ligaments Suboptimally assessed by CT. Muscles and Tendons No acute abnormality. Soft tissues Soft tissue swelling about the ankle. IMPRESSION: Trimalleolar fracture of the left ankle.  No dislocation.  Electronically Signed   By: Norman Gatlin M.D.   On: 11/23/2023 00:22   DG Ankle 2 Views Left Result Date: 11/23/2023 CLINICAL DATA:  Postreduction EXAM: LEFT ANKLE - 2 VIEW COMPARISON:  11/22/2023 FINDINGS: In splint views of the left ankle demonstrate interval reduction of the previously seen tibiotalar dislocation. Trimalleolar fracture again noted continued displacement. Angulation of the distal fibular fracture. IMPRESSION: Trimalleolar fracture with continued displacement and angulation of fracture fragments. Reduction of the previously seen dislocation. Electronically Signed   By: Franky Crease M.D.   On: 11/23/2023 00:10   DG Ankle Complete Left Result Date: 11/22/2023 CLINICAL DATA:  Tripped and landed on bottom.  Pain in left ankle. EXAM: LEFT ANKLE COMPLETE - 3+ VIEW COMPARISON:  None Available. FINDINGS: Acute oblique fracture through the distal left fibular metadiaphysis. The fracture line extends into the tibiofibular syndesmosis 2.4 cm above the ankle mortise. Apex medial angulation. Mildly displaced medial malleolar fracture. Displaced angulated fracture of the posterior malleolus. Apex anterior angulation. Probable nondisplaced fracture of the lateral process of the talus. Posterior dislocation of the talus in relation to the distal tibia by 1 full shaft width. Large ankle joint effusion. Soft tissue swelling about the ankle. IMPRESSION: Trimalleolar fracture dislocation of the left ankle. Electronically Signed   By: Norman Gatlin M.D.   On: 11/22/2023 21:00    Blood pressure 117/69, pulse (!) 57, temperature 97.8 F (36.6 C), temperature source Oral, resp. rate 16, height 5' 6 (1.676 m), weight 72.6 kg, SpO2 95%.  Assessment Left closed displaced trimalleolar ankle fracture with syndesmotic disruption  Plan - Patient seen and examined. -X-ray and CT imaging reviewed and discussed with patient in detail showing trimalleolar ankle fracture which appears to be closed and displaced  involving the lateral malleolus, medial malleolus and posterior malleolus with syndesmotic disruption. - Discussed treatment options. - All treatment options were discussed  with the patient of both conservative and surgical attempts at correction including potential risks and complications.  Patient has elected for procedure consisting of left trimalleolar ankle fracture open reduction with internal fixation with syndesmotic ligament repair.  No guarantees given.  Consent obtained.  Patient is at risk for obtaining posttraumatic arthritis.  Discussed also some possible numbness around procedural areas as well as scar tissue that can form.  Will require substantial physical therapy.  Patient be required to be nonweightbearing for 6 weeks and then weightbearing in a boot along with PT and potentially transition to normal supportive shoe with ASO ankle brace somewhere between 10 to 12 weeks postop.  Patient agreeable to procedure. - Patient has been n.p.o. since this morning.  Plan for surgical intervention today around 4 PM.  Discussed case with Dr. Ashley as well who is agreeable to assisting with the procedure. - After procedure will follow back with patient tomorrow in regards to pain management control.  Will also order physical therapy/OT therapy after procedure.   Prentice Lee, DPM 11/23/2023, 12:31 PM

## 2023-11-23 NOTE — H&P (Signed)
 HISTORY AND PHYSICAL INTERVAL NOTE:  11/23/2023  4:21 PM  Katrina [name]  has presented today for surgery, with the diagnosis of Ankle Fracture.  The various methods of treatment have been discussed with the patient.  No guarantees were given.  After consideration of risks, benefits and other options for treatment, the patient has consented to surgery.  I have reviewed the patients' chart and labs.    PROCEDURE: LEFT TRIMALLEOLAR ANKLE FRACTURE ORIF LEFT SYNDESMOSIS LIGAMENT REPAIR   A history and physical examination was performed in the hospital.  The patient was reexamined.  There have been no changes to this history and physical examination.  Prentice Lee, DPM

## 2023-11-23 NOTE — Hospital Course (Addendum)
 73 y.o. female with medical history significant for HLD and hypothyroidism being admitted with a left trimalleolar fracture that she sustained when she twisted her ankle going downstairs after missing a step.  She had immediate onset of pain.  She was previously in her usual state of health and denied preceding lightheadedness, visual disturbance, headache, one-sided weakness numbness tingling, chest pain, palpitations or shortness of breath. In the ED vitals within normal limits and labs including CBC and differential unremarkable. CT ankle showing trimalleolar fracture.  X-ray hip nonacute. The ED provider spoke with podiatrist, Dr. Lennie who will take patient to the OR later today. Patient treated with fentanyl  and oxycodone  for pain, underwent reduction of the ankle under conscious sedation in the ED. Admission requested   8/18.  Patient feeling okay.  Some pain in her ankle.  Had a fall.

## 2023-11-23 NOTE — Transfer of Care (Signed)
 Immediate Anesthesia Transfer of Care Note  Patient: Katrina [name] Dec  Procedure(s) Performed: OPEN REDUCTION INTERNAL FIXATION (ORIF) ANKLE FRACTURE (Left: Ankle)  Patient Location: PACU  Anesthesia Type:General  Level of Consciousness: awake, alert , oriented, and drowsy  Airway & Oxygen Therapy: Patient Spontanous Breathing and Patient connected to face mask oxygen  Post-op Assessment: Report given to RN and Post -op Vital signs reviewed and stable  Post vital signs: Reviewed and stable  Last Vitals:  Vitals Value Taken Time  BP 105/72 11/23/23 20:07  Temp    Pulse 77 11/23/23 20:07  Resp 14 11/23/23 20:07  SpO2 97 % 11/23/23 20:07    Last Pain:  Vitals:   11/23/23 1536  TempSrc: Temporal  PainSc: 1          Complications: No notable events documented.

## 2023-11-24 ENCOUNTER — Encounter: Payer: Self-pay | Admitting: Podiatry

## 2023-11-24 DIAGNOSIS — E039 Hypothyroidism, unspecified: Secondary | ICD-10-CM | POA: Diagnosis not present

## 2023-11-24 DIAGNOSIS — S82852S Displaced trimalleolar fracture of left lower leg, sequela: Secondary | ICD-10-CM | POA: Diagnosis not present

## 2023-11-24 DIAGNOSIS — R7301 Impaired fasting glucose: Secondary | ICD-10-CM | POA: Diagnosis not present

## 2023-11-24 DIAGNOSIS — E785 Hyperlipidemia, unspecified: Secondary | ICD-10-CM | POA: Diagnosis not present

## 2023-11-24 DIAGNOSIS — S82852A Displaced trimalleolar fracture of left lower leg, initial encounter for closed fracture: Secondary | ICD-10-CM | POA: Diagnosis not present

## 2023-11-24 LAB — CBC
HCT: 41.5 % (ref 36.0–46.0)
Hemoglobin: 13.4 g/dL (ref 12.0–15.0)
MCH: 31.5 pg (ref 26.0–34.0)
MCHC: 32.3 g/dL (ref 30.0–36.0)
MCV: 97.4 fL (ref 80.0–100.0)
Platelets: 238 K/uL (ref 150–400)
RBC: 4.26 MIL/uL (ref 3.87–5.11)
RDW: 12.4 % (ref 11.5–15.5)
WBC: 8.1 K/uL (ref 4.0–10.5)
nRBC: 0 % (ref 0.0–0.2)

## 2023-11-24 LAB — BASIC METABOLIC PANEL WITH GFR
Anion gap: 8 (ref 5–15)
BUN: 13 mg/dL (ref 8–23)
CO2: 26 mmol/L (ref 22–32)
Calcium: 8.8 mg/dL — ABNORMAL LOW (ref 8.9–10.3)
Chloride: 103 mmol/L (ref 98–111)
Creatinine, Ser: 0.84 mg/dL (ref 0.44–1.00)
GFR, Estimated: >60 mL/min (ref >60)
Glucose, Bld: 148 mg/dL — ABNORMAL HIGH (ref 70–99)
Potassium: 4.3 mmol/L (ref 3.5–5.1)
Sodium: 137 mmol/L (ref 135–145)

## 2023-11-24 MED ORDER — ACETAMINOPHEN 325 MG PO TABS
650.0000 mg | ORAL_TABLET | Freq: Four times a day (QID) | ORAL | Status: DC | PRN
Start: 2023-11-24 — End: 2024-02-12

## 2023-11-24 MED ORDER — OXYCODONE HCL 5 MG PO TABS: 5.0000 mg | ORAL_TABLET | ORAL | 0 refills | Status: AC | PRN

## 2023-11-24 MED ORDER — ASPIRIN 81 MG PO TBEC
81.0000 mg | DELAYED_RELEASE_TABLET | Freq: Two times a day (BID) | ORAL | Status: DC
  Administered 2023-11-24: 81 mg via ORAL
  Filled 2023-11-24: qty 1

## 2023-11-24 MED ORDER — CEPHALEXIN 500 MG PO CAPS: 500.0000 mg | ORAL_CAPSULE | Freq: Three times a day (TID) | ORAL | 0 refills | Status: AC

## 2023-11-24 MED ORDER — CEPHALEXIN 500 MG PO CAPS
500.0000 mg | ORAL_CAPSULE | Freq: Three times a day (TID) | ORAL | Status: DC
  Administered 2023-11-24: 500 mg via ORAL
  Filled 2023-11-24: qty 1

## 2023-11-24 MED ORDER — ASPIRIN 81 MG PO TBEC: 81.0000 mg | DELAYED_RELEASE_TABLET | Freq: Two times a day (BID) | ORAL | 0 refills | Status: AC

## 2023-11-24 NOTE — Plan of Care (Signed)
  Problem: Education: Goal: Knowledge of General Education information will improve Description: Including pain rating scale, medication(s)/side effects and non-pharmacologic comfort measures Outcome: Progressing   Problem: Clinical Measurements: Goal: Ability to maintain clinical measurements within normal limits will improve Outcome: Progressing   Problem: Activity: Goal: Risk for activity intolerance will decrease Outcome: Progressing   Problem: Nutrition: Goal: Adequate nutrition will be maintained Outcome: Progressing   Problem: Elimination: Goal: Will not experience complications related to bowel motility Outcome: Progressing   Problem: Pain Managment: Goal: General experience of comfort will improve and/or be controlled Outcome: Progressing   Problem: Safety: Goal: Ability to remain free from injury will improve Outcome: Progressing   Problem: Skin Integrity: Goal: Risk for impaired skin integrity will decrease Outcome: Progressing

## 2023-11-24 NOTE — Progress Notes (Signed)
 Discharge criteria met; IV removed.

## 2023-11-24 NOTE — Plan of Care (Signed)
  Problem: Education: Goal: Knowledge of General Education information will improve Description: Including pain rating scale, medication(s)/side effects and non-pharmacologic comfort measures Outcome: Progressing   Problem: Clinical Measurements: Goal: Diagnostic test results will improve Outcome: Progressing   Problem: Activity: Goal: Risk for activity intolerance will decrease Outcome: Progressing   Problem: Coping: Goal: Level of anxiety will decrease Outcome: Progressing   Problem: Safety: Goal: Ability to remain free from injury will improve Outcome: Progressing

## 2023-11-24 NOTE — Discharge Summary (Signed)
 Physician Discharge Summary   Patient: Katrina [name] MRN: [id number] DOB: 04-May-1950  Admit date:     11/22/2023  Discharge date: 11/24/23  Discharge Physician: Charlie Patterson   PCP: Randeen Laine LABOR, MD   Recommendations at discharge:   Follow-up PCP Follow-up Dr. Lennie podiatry 1 week  Discharge Diagnoses: Principal Problem:   Fracture of ankle, trimalleolar, left, closed, sequela Active Problems:   Hypothyroid   Hyperlipidemia   Impaired fasting glucose    Hospital Course:  73 y.o. female with medical history significant for HLD and hypothyroidism being admitted with a left trimalleolar fracture that she sustained when she twisted her ankle going downstairs after missing a step.  She had immediate onset of pain.  She was previously in her usual state of health and denied preceding lightheadedness, visual disturbance, headache, one-sided weakness numbness tingling, chest pain, palpitations or shortness of breath. In the ED vitals within normal limits and labs including CBC and differential unremarkable. CT ankle showing trimalleolar fracture.  X-ray hip nonacute. The ED provider spoke with podiatrist, Dr. Lennie who will take patient to the OR later today. Patient treated with fentanyl  and oxycodone  for pain, underwent reduction of the ankle under conscious sedation in the ED. Admission requested   8/18.  Patient feeling okay.  Some pain in her ankle.  Had a fall.  Went to the operating room by Dr. Lennie for left bimalleolar ankle fracture open reduction with internal fixation. 8/19.  Patient did well with physical therapy.  Outpatient physical therapy recommended.  Nonweightbearing left foot.  Cleared by podiatry to go home.  Aspirin  81 mg twice daily for 42 days, 1 week of Keflex , 28 pills of oxycodone  prescribed for discharge.  Assessment and Plan: * Fracture of ankle, trimalleolar, left, closed, sequela Dr. Lennie podiatry took to the operating room on 8/18 for open reduction  internal fixation.  Patient doing well postoperatively.  Did well with physical therapy.  Nonweightbearing left leg for 6 weeks.  Follow-up 1 week Dr. Lennie.  DVT prophylaxis with aspirin  81 mg twice a day for 42 days.  Prophylactic 1 week of antibiotic Keflex .  Pain control with oxycodone  28 tablets prescribed.   Hypothyroid On levothyroxine   Hyperlipidemia On Zetia   Impaired fasting glucose Sugar 148 on a.m. labs.  Follow-up as outpatient.         Consultants: Podiatry Procedures performed: Open reduction internal fixation left ankle Disposition: Home Diet recommendation:  Low-cholesterol DISCHARGE MEDICATION: Allergies as of 11/24/2023       Reactions   Advil [ibuprofen]    Not an allergy but feels awful the next day, lethargic. Still takes it from time-to-time.   Amoxicillin -pot Clavulanate Hives   Chest pain, itchy hives - likely clavulanate component as has tolerated plain amoxicillin  in the past   Clindamycin/lincomycin Other (See Comments)   Caused C-diff   Codeine Nausea And Vomiting   Crestor  [rosuvastatin ]    Muscle pain        Medication List     TAKE these medications    acetaminophen  325 MG tablet Commonly known as: TYLENOL  Take 2 tablets (650 mg total) by mouth every 6 (six) hours as needed for mild pain (pain score 1-3) or fever (or Fever >/= 101).   albuterol  108 (90 Base) MCG/ACT inhaler Commonly known as: VENTOLIN  HFA Inhale 2 puffs into the lungs every 4 (four) hours as needed for wheezing.   ascorbic acid 500 MG tablet Commonly known as: VITAMIN C Take 500 mg by mouth daily  with lunch.   aspirin  EC 81 MG tablet Take 1 tablet (81 mg total) by mouth 2 (two) times daily. Swallow whole.   calcium  carbonate 1500 (600 Ca) MG Tabs tablet Commonly known as: OSCAL Take 600 mg of elemental calcium  by mouth daily at 12 noon.   cephALEXin  500 MG capsule Commonly known as: KEFLEX  Take 1 capsule (500 mg total) by mouth every 8 (eight) hours  for 7 days.   COLLAGEN PO Take 1 Scoop by mouth daily.   ezetimibe  10 MG tablet Commonly known as: ZETIA  TAKE 1 TABLET BY MOUTH AT BEDTIME   fluticasone 50 MCG/ACT nasal spray Commonly known as: FLONASE Place 1 spray into both nostrils daily.   levothyroxine  88 MCG tablet Commonly known as: SYNTHROID  TAKE 1 TABLET BY MOUTH ONCE DAILY BEFORE BREAKFAST   multivitamin with minerals Tabs tablet Take 1 tablet by mouth daily with lunch.   oxyCODONE  5 MG immediate release tablet Commonly known as: Oxy IR/ROXICODONE  Take 1 tablet (5 mg total) by mouth every 4 (four) hours as needed for up to 5 days for moderate pain (pain score 4-6).   Vitamin D -3 125 MCG (5000 UT) Tabs Take 5,000 Units by mouth daily with lunch.        Follow-up Information     Lennie Barter, DPM. Schedule an appointment as soon as possible for a visit in 1 week(s).   Specialty: Podiatry Why: For wound re-check Contact information: [address] [place] [address] [phone]         Randeen Laine LABOR, MD Follow up in 5 day(s).   Specialties: Family Medicine, Radiology Contact information: [address] [place] [address] [phone]                Discharge Exam: Katrina [name]   11/22/23 2322 11/23/23 1536  Weight: 72.6 kg 72.6 kg   Physical Exam HENT:     Head: Normocephalic.  Eyes:     General: Lids are normal.     Conjunctiva/sclera: Conjunctivae normal.  Cardiovascular:     Rate and Rhythm: Normal rate and regular rhythm.     Heart sounds: Normal heart sounds, S1 normal and S2 normal.  Pulmonary:     Breath sounds: No decreased breath sounds, wheezing, rhonchi or rales.  Abdominal:     Palpations: Abdomen is soft.     Tenderness: There is no abdominal tenderness.  Musculoskeletal:     Right lower leg: No swelling.     Left lower leg: No swelling.  Skin:    General: Skin is warm.     Findings: No rash.  Neurological:     Mental Status: She is alert  and oriented to person, place, and time.     Comments: Patient was unable to move her toes on her left foot when I saw her in the morning.  Likely still nerve block from surgery.  She was able to feel her toes when I was touching them.      Condition at discharge: stable  The results of significant diagnostics from this hospitalization (including imaging, microbiology, ancillary and laboratory) are listed below for reference.   Imaging Studies: DG Ankle 2 Views Left Result Date: 11/23/2023 CLINICAL DATA:  Ankle fracture, internal fixation EXAM: LEFT ANKLE - 2 VIEW COMPARISON:  CT 11/22/2023 FINDINGS: Multiple intraoperative spot images demonstrate internal fixation with screw fixation of the medial malleolus, plate and screw fixation in the distal fibula and posterior malleolus. Anatomic alignment. No hardware complicating  feature. IMPRESSION: Internal fixation.  No visible complicating feature. Electronically Signed   By: Franky Crease M.D.   On: 11/23/2023 20:04   DG C-Arm 1-60 Min-No Report Result Date: 11/23/2023 Fluoroscopy was utilized by the requesting physician.  No radiographic interpretation.   DG C-Arm 1-60 Min-No Report Result Date: 11/23/2023 Fluoroscopy was utilized by the requesting physician.  No radiographic interpretation.   DG C-Arm 1-60 Min-No Report Result Date: 11/23/2023 Fluoroscopy was utilized by the requesting physician.  No radiographic interpretation.   DG C-Arm 1-60 Min-No Report Result Date: 11/23/2023 Fluoroscopy was utilized by the requesting physician.  No radiographic interpretation.   US  OR NERVE BLOCK-IMAGE ONLY [facility]) Result Date: 11/23/2023 There is no interpretation for this exam.  This order is for images obtained during a surgical procedure.  Please See Surgeries Tab for more information regarding the procedure.   DG Hip Unilat W or Wo Pelvis 1 View Left Result Date: 11/23/2023 CLINICAL DATA:  Pain after fall EXAM: DG HIP (WITH OR WITHOUT PELVIS)  1V*L* COMPARISON:  None Available. FINDINGS: No acute fracture or dislocation. Degenerative changes pubic symphysis, both hips, SI joints and lower lumbar spine. IMPRESSION: No acute fracture or dislocation. Electronically Signed   By: Norman Gatlin M.D.   On: 11/23/2023 00:27   CT Ankle Left Wo Contrast Result Date: 11/23/2023 CLINICAL DATA:  Postreduction, EXAM: CT OF THE LEFT ANKLE WITHOUT CONTRAST TECHNIQUE: Multidetector CT imaging of the left ankle was performed according to the standard protocol. Multiplanar CT image reconstructions were also generated. RADIATION DOSE REDUCTION: This exam was performed according to the departmental dose-optimization program which includes automated exposure control, adjustment of the mA and/or kV according to patient size and/or use of iterative reconstruction technique. COMPARISON:  Same day ankle radiographs FINDINGS: Bones/Joint/Cartilage Comminuted transverse fracture of the distal fibular diaphysis extending into the tibiofibular syndesmosis 3.6 cm above the ankle mortise. There is 4 mm of lateral displacement of the distal fragment. 3 mm of inferior displacement of the medial malleolus fracture. The posterior malleolus fracture is displaced posteriorly 2 mm. No dislocation. Ligaments Suboptimally assessed by CT. Muscles and Tendons No acute abnormality. Soft tissues Soft tissue swelling about the ankle. IMPRESSION: Trimalleolar fracture of the left ankle.  No dislocation. Electronically Signed   By: Norman Gatlin M.D.   On: 11/23/2023 00:22   DG Ankle 2 Views Left Result Date: 11/23/2023 CLINICAL DATA:  Postreduction EXAM: LEFT ANKLE - 2 VIEW COMPARISON:  11/22/2023 FINDINGS: In splint views of the left ankle demonstrate interval reduction of the previously seen tibiotalar dislocation. Trimalleolar fracture again noted continued displacement. Angulation of the distal fibular fracture. IMPRESSION: Trimalleolar fracture with continued displacement and angulation  of fracture fragments. Reduction of the previously seen dislocation. Electronically Signed   By: Franky Crease M.D.   On: 11/23/2023 00:10   DG Ankle Complete Left Result Date: 11/22/2023 CLINICAL DATA:  Tripped and landed on bottom.  Pain in left ankle. EXAM: LEFT ANKLE COMPLETE - 3+ VIEW COMPARISON:  None Available. FINDINGS: Acute oblique fracture through the distal left fibular metadiaphysis. The fracture line extends into the tibiofibular syndesmosis 2.4 cm above the ankle mortise. Apex medial angulation. Mildly displaced medial malleolar fracture. Displaced angulated fracture of the posterior malleolus. Apex anterior angulation. Probable nondisplaced fracture of the lateral process of the talus. Posterior dislocation of the talus in relation to the distal tibia by 1 full shaft width. Large ankle joint effusion. Soft tissue swelling about the ankle. IMPRESSION: Trimalleolar fracture dislocation of the  left ankle. Electronically Signed   By: Norman Gatlin M.D.   On: 11/22/2023 21:00    Microbiology: Results for orders placed or performed during the hospital encounter of 04/30/20  SARS CORONAVIRUS 2 (TAT 6-24 HRS) Nasopharyngeal Nasopharyngeal Swab     Status: None   Collection Time: 04/30/20 10:53 AM   Specimen: Nasopharyngeal Swab  Result Value Ref Range Status   SARS Coronavirus 2 NEGATIVE NEGATIVE Final    Comment: (NOTE) SARS-CoV-2 target nucleic acids are NOT DETECTED.  The SARS-CoV-2 RNA is generally detectable in upper and lower respiratory specimens during the acute phase of infection. Negative results do not preclude SARS-CoV-2 infection, do not rule out co-infections with other pathogens, and should not be used as the sole basis for treatment or other patient management decisions. Negative results must be combined with clinical observations, patient history, and epidemiological information. The expected result is Negative.  Fact Sheet for  Patients: HairSlick.no  Fact Sheet for Healthcare Providers: quierodirigir.com  This test is not yet approved or cleared by the [place]  FDA and  has been authorized for detection and/or diagnosis of SARS-CoV-2 by FDA under an Emergency Use Authorization (EUA). This EUA will remain  in effect (meaning this test can be used) for the duration of the COVID-19 declaration under Se ction 564(b)(1) of the Act, 21 U.S.C. section 360bbb-3(b)(1), unless the authorization is terminated or revoked sooner.  Performed at [facility], [address] [place], [place] [address]     Labs: CBC: Recent Labs  Lab 11/23/23 0016 11/24/23 0448  WBC 7.5 8.1  NEUTROABS 5.3  --   HGB 13.0 13.4  HCT 39.4 41.5  MCV 96.6 97.4  PLT 247 238   Basic Metabolic Panel: Recent Labs  Lab 11/23/23 0016 11/24/23 0448  NA 142 137  K 4.0 4.3  CL 106 103  CO2 26 26  GLUCOSE 115* 148*  BUN 21 13  CREATININE 0.97 0.84  CALCIUM  8.5* 8.8*   Liver Function Tests: No results for input(s): AST, ALT, ALKPHOS, BILITOT, PROT, ALBUMIN in the last 168 hours. CBG: No results for input(s): GLUCAP in the last 168 hours.  Discharge time spent: greater than 30 minutes.  Signed: Charlie Patterson, MD [facility] 11/24/2023

## 2023-11-24 NOTE — Evaluation (Signed)
 Physical Therapy Evaluation Patient Details Name: Katrina [name] MRN: [id number] DOB: 08-17-1950 Today's Date: 11/24/2023  History of Present Illness  Pt is a 73 y.o. female presenting to hospital 11/22/23 with c/o L ankle pain (twisted ankle going downstairs after missing a step).  Pt admitted with L ankle trimalleolar fx.  S/p ORIF L trimalleolar ankle fx 11/23/23.  PMH includes asthma, plantar fasciitis, stress fx of foot, R chondroplasty 07/2021.  Clinical Impression  Prior to recent medical concerns, pt reports being independent and active; lives with her husband and son on main level of home with 4 STE L railing; pt's husband able to assist as needed.  Pt reporting no pain throughout session (pt reports nerve block still working s/p L ankle surgery); unable to wiggle L toes (MD Wieting notified).  Currently pt is modified independent with transfers; CGA with ambulation 100 feet with RW use; and CGA navigating steps with use of railing and axillary crutch.  Pt did well maintaining L LE NWB'ing status throughout session.  Pt would currently benefit from skilled PT to address noted impairments and functional limitations (see below for any additional details).  Upon hospital discharge, pt would benefit from ongoing therapy (discussed with pt; pt requesting OP PT--MD Wieting notified).     If plan is discharge home, recommend the following: A little help with walking and/or transfers;A little help with bathing/dressing/bathroom;Assistance with cooking/housework;Assist for transportation;Help with stairs or ramp for entrance   Can travel by private vehicle    Yes    Equipment Recommendations Rolling walker (2 wheels) (pt has RW, toilet riser, and manual w/c at home already)  Recommendations for Other Services       Functional Status Assessment Patient has had a recent decline in their functional status and demonstrates the ability to make significant improvements in function in a reasonable and  predictable amount of time.     Precautions / Restrictions Precautions Precautions: Fall Recall of Precautions/Restrictions: Intact Required Braces or Orthoses: Splint/Cast Splint/Cast: L ankle Splint/Cast - Date Prophylactic Dressing Applied (if applicable): 11/23/23 Restrictions Weight Bearing Restrictions Per Provider Order: Yes LLE Weight Bearing Per Provider Order: Non weight bearing Other Position/Activity Restrictions: Elevated L LE on 2 pillows      Mobility  Bed Mobility               General bed mobility comments: Deferred (pt reports no issues; pt in recliner beginning//end of session)    Transfers Overall transfer level: Modified independent Equipment used: Rolling walker (2 wheels)               General transfer comment: steady transfer from recliner x3 trials; initial vc's for reaching back prior to sitting    Ambulation/Gait Ambulation/Gait assistance: Contact guard assist Gait Distance (Feet): 100 Feet Assistive device: Rolling walker (2 wheels)   Gait velocity: decreased     General Gait Details: hop to gait pattern; able to maintain NWB'ing status well; steady with RW use  Stairs Stairs: Yes Stairs assistance: Contact guard assist Stair Management: One rail Left, Step to pattern, Forwards, With crutches Number of Stairs:  (4 steps x2 trials) General stair comments: ascended/descended 4 steps x2 with railing and axillary crutch use; initial vc's for overall technique; steady; did well maintaining NWB'ing status  Wheelchair Mobility     Tilt Bed    Modified Rankin (Stroke Patients Only)       Balance Overall balance assessment: Needs assistance Sitting-balance support: No upper extremity supported, Feet supported Sitting balance-Leahy  Scale: Normal Sitting balance - Comments: steady reaching outside BOS   Standing balance support: Bilateral upper extremity supported, During functional activity, Reliant on assistive device for  balance Standing balance-Leahy Scale: Good Standing balance comment: steady ambulating with RW use (keeping NWB'ing L LE)                             Pertinent Vitals/Pain Pain Assessment Pain Assessment: No/denies pain HR 95 bpm and SpO2 sats 96% on room air with activity.    Home Living Family/patient expects to be discharged to:: Private residence Living Arrangements: Spouse/significant other;Children (Son) Available Help at Discharge: Family;Available 24 hours/day Type of Home: House Home Access: Stairs to enter Entrance Stairs-Rails: Left Entrance Stairs-Number of Steps: 4   Home Layout: Two level;Able to live on main level with bedroom/bathroom Home Equipment: Rolling Walker (2 wheels);Rollator (4 wheels);Wheelchair - manual;Toilet riser;Crutches;Shower seat - built in;Hand held shower head      Prior Function Prior Level of Function : Independent/Modified Independent             Mobility Comments: Independent and active. ADLs Comments: Independent     Extremity/Trunk Assessment   Upper Extremity Assessment Upper Extremity Assessment: Overall WFL for tasks assessed    Lower Extremity Assessment Lower Extremity Assessment: LLE deficits/detail LLE Deficits / Details: able to perform L LE SLR independently; unable to wiggle toes (MD Wieting notified); decreased light touch all toes (pt reports pressure sensation) LLE: Unable to fully assess due to immobilization LLE Sensation: decreased light touch    Cervical / Trunk Assessment Cervical / Trunk Assessment: Normal  Communication   Communication Communication: No apparent difficulties    Cognition Arousal: Alert Behavior During Therapy: WFL for tasks assessed/performed   PT - Cognitive impairments: No apparent impairments                         Following commands: Intact       Cueing Cueing Techniques: Verbal cues     General Comments General comments (skin integrity,  edema, etc.): L toes noted to be swollen.  Nursing cleared pt for participation in physical therapy.  Pt agreeable to PT session.    Exercises  Gait/stairs training   Assessment/Plan    PT Assessment Patient needs continued PT services  PT Problem List Decreased strength;Decreased activity tolerance;Decreased balance;Decreased mobility;Decreased knowledge of use of DME;Decreased knowledge of precautions;Impaired sensation;Decreased skin integrity;Pain       PT Treatment Interventions DME instruction;Gait training;Stair training;Functional mobility training;Therapeutic activities;Therapeutic exercise;Balance training;Patient/family education    PT Goals (Current goals can be found in the Care Plan section)  Acute Rehab PT Goals Patient Stated Goal: to go home PT Goal Formulation: With patient Time For Goal Achievement: 12/08/23 Potential to Achieve Goals: Good    Frequency Min 3X/week     Co-evaluation               AM-PAC PT 6 Clicks Mobility  Outcome Measure Help needed turning from your back to your side while in a flat bed without using bedrails?: None Help needed moving from lying on your back to sitting on the side of a flat bed without using bedrails?: None Help needed moving to and from a bed to a chair (including a wheelchair)?: None Help needed standing up from a chair using your arms (e.g., wheelchair or bedside chair)?: None Help needed to walk in hospital room?: A Little Help needed  climbing 3-5 steps with a railing? : A Little 6 Click Score: 22    End of Session Equipment Utilized During Treatment: Gait belt Activity Tolerance: Patient tolerated treatment well Patient left: in chair;with call bell/phone within reach;with family/visitor present (pt in recliner (no chair alarm in place) upon PT arrival; pt reports having assist when getting up (calls appropriately)); L LE elevated on 2 pillows Nurse Communication: Mobility status;Precautions;Weight bearing  status PT Visit Diagnosis: Other abnormalities of gait and mobility (R26.89);Muscle weakness (generalized) (M62.81);History of falling (Z91.81);Pain Pain - Right/Left: Left Pain - part of body: Ankle and joints of foot    Time: 1025-1058 PT Time Calculation (min) (ACUTE ONLY): 33 min   Charges:   PT Evaluation $PT Eval Low Complexity: 1 Low PT Treatments $Gait Training: 8-22 mins PT General Charges $$ ACUTE PT VISIT: 1 Visit        Damien Caulk, PT 11/24/23, 11:57 AM

## 2023-11-24 NOTE — Assessment & Plan Note (Signed)
 Sugar 148 on a.m. labs.  Follow-up as outpatient.

## 2023-11-24 NOTE — Progress Notes (Signed)
 PODIATRY: PROGRESS NOTE    Surgery:  Procedure(s) (LRB): OPEN REDUCTION INTERNAL FIXATION (ORIF) ANKLE FRACTURE (Left) POD:  1 Day Post-Op  O/N: NAEON  Subjective:  Patient resting comfortably at bedside.  Denies F/C/N/V/SOB/CP. Denies acute calf pain.    PHYSICAL EXAMINATION: BP 98/75   Pulse (!) 57   Temp 98.2 F (36.8 C) (Oral)   Resp 18   Ht 5' 6 (1.676 m)   Wt 72.6 kg   SpO2 98%   BMI 25.83 kg/m ? GEN: NAD. AOX3. ? RESP: Non-labored breathing on RA.? ABD: NT/ND of all four quadrants.? NEURO: Moving all four extremities spontaneously. ? ? FOCUSED LOWER EXTREMITY EXAMINATION:? NEURO: ? - SLIT to tib/saph/dp/sp/sural nerve distributions. ? - No paresthesias elicited on examination. ??  VASCULAR: ? - Capillary refill <3 seconds. ???  MSK: ? - TTP: currently not able to wiggle toes  - No calf tenderness. ?  DERM: ? - Dressings c/d/I. ? - splint intact without noted strikethrough ?- neutral ankle positioning   ?? Imaging: ? Narrative & Impression  CLINICAL DATA:  Ankle fracture, internal fixation   EXAM: LEFT ANKLE - 2 VIEW   COMPARISON:  CT 11/22/2023   FINDINGS: Multiple intraoperative spot images demonstrate internal fixation with screw fixation of the medial malleolus, plate and screw fixation in the distal fibula and posterior malleolus. Anatomic alignment. No hardware complicating feature.   IMPRESSION: Internal fixation.  No visible complicating feature.     Electronically Signed   By: Franky Crease M.D.   On: 11/23/2023 20:04    Results for orders placed or performed during the hospital encounter of 04/30/20  SARS CORONAVIRUS 2 (TAT 6-24 HRS) Nasopharyngeal Nasopharyngeal Swab     Status: None   Collection Time: 04/30/20 10:53 AM   Specimen: Nasopharyngeal Swab  Result Value Ref Range Status   SARS Coronavirus 2 NEGATIVE NEGATIVE Final    Comment: (NOTE) SARS-CoV-2 target nucleic acids are NOT DETECTED.  The SARS-CoV-2 RNA is  generally detectable in upper and lower respiratory specimens during the acute phase of infection. Negative results do not preclude SARS-CoV-2 infection, do not rule out co-infections with other pathogens, and should not be used as the sole basis for treatment or other patient management decisions. Negative results must be combined with clinical observations, patient history, and epidemiological information. The expected result is Negative.  Fact Sheet for Patients: HairSlick.no  Fact Sheet for Healthcare Providers: quierodirigir.com  This test is not yet approved or cleared by the [place]  FDA and  has been authorized for detection and/or diagnosis of SARS-CoV-2 by FDA under an Emergency Use Authorization (EUA). This EUA will remain  in effect (meaning this test can be used) for the duration of the COVID-19 declaration under Se ction 564(b)(1) of the Act, 21 U.S.C. section 360bbb-3(b)(1), unless the authorization is terminated or revoked sooner.  Performed at [facility], [address] [place], [place] [address]        ASSESSMENT:?  Katrina [name] is a 73 y.o. female ?that presented to the ED with LEFT ankle trimalleolar fracture 11/22/23 now s/p ORIF of the LEFT trimalleolar fracture with Dr. Lennie; doing well.    PLAN:? - Activity: Weight bearing status - NWB X 6 WEEKS   - Diet: resume  - Wound Care: Podiatry to conduct post-op. Materials: Betadine / Adaptic / Gauze (4x4) / Kerlix / 4 inch ACE bandage Instructions: *Podiatry to conduct post-op. Supplement Reccs: Okay to reinforce with dry gauze / loose ACE  overlap prn strikethrough. Please leave underlayer intact.  - ABX: 500mg  TID x 7 days at discharge.  Anti-infectives (From admission, onward)    Start     Dose/Rate Route Frequency Ordered Stop   11/24/23 0930  cephALEXin  (KEFLEX ) capsule 500 mg        500 mg Oral Every 8 hours 11/24/23 0832  12/01/23 0559   11/24/23 0600  ceFAZolin  (ANCEF ) IVPB 2g/100 mL premix        2 g 200 mL/hr over 30 Minutes Intravenous On call to O.R. 11/23/23 1253 11/23/23 1707      - DVT PPX: aspirin  81mg  BID at discharge for period of NWB  - Pain management: would recommend Percocet 5/325 1 PO q6h PRN pain, 28 tablets at discharge - Dispo: to follow up with podiatry clinic in 1 week.

## 2023-11-30 DIAGNOSIS — S82852A Displaced trimalleolar fracture of left lower leg, initial encounter for closed fracture: Secondary | ICD-10-CM | POA: Diagnosis not present

## 2023-12-08 DIAGNOSIS — S82852D Displaced trimalleolar fracture of left lower leg, subsequent encounter for closed fracture with routine healing: Secondary | ICD-10-CM | POA: Diagnosis not present

## 2023-12-21 DIAGNOSIS — S82852D Displaced trimalleolar fracture of left lower leg, subsequent encounter for closed fracture with routine healing: Secondary | ICD-10-CM | POA: Diagnosis not present

## 2023-12-31 ENCOUNTER — Other Ambulatory Visit: Payer: Self-pay | Admitting: Family Medicine

## 2024-01-04 DIAGNOSIS — S82852D Displaced trimalleolar fracture of left lower leg, subsequent encounter for closed fracture with routine healing: Secondary | ICD-10-CM | POA: Diagnosis not present

## 2024-01-05 DIAGNOSIS — Z8781 Personal history of (healed) traumatic fracture: Secondary | ICD-10-CM | POA: Diagnosis not present

## 2024-01-05 DIAGNOSIS — Z9889 Other specified postprocedural states: Secondary | ICD-10-CM | POA: Diagnosis not present

## 2024-01-05 DIAGNOSIS — M25572 Pain in left ankle and joints of left foot: Secondary | ICD-10-CM | POA: Diagnosis not present

## 2024-01-18 DIAGNOSIS — M25572 Pain in left ankle and joints of left foot: Secondary | ICD-10-CM | POA: Diagnosis not present

## 2024-01-18 DIAGNOSIS — S82852D Displaced trimalleolar fracture of left lower leg, subsequent encounter for closed fracture with routine healing: Secondary | ICD-10-CM | POA: Diagnosis not present

## 2024-01-18 DIAGNOSIS — Z8781 Personal history of (healed) traumatic fracture: Secondary | ICD-10-CM | POA: Diagnosis not present

## 2024-01-18 DIAGNOSIS — Z9889 Other specified postprocedural states: Secondary | ICD-10-CM | POA: Diagnosis not present

## 2024-01-25 DIAGNOSIS — M25572 Pain in left ankle and joints of left foot: Secondary | ICD-10-CM | POA: Diagnosis not present

## 2024-01-25 DIAGNOSIS — Z8781 Personal history of (healed) traumatic fracture: Secondary | ICD-10-CM | POA: Diagnosis not present

## 2024-01-25 DIAGNOSIS — Z9889 Other specified postprocedural states: Secondary | ICD-10-CM | POA: Diagnosis not present

## 2024-01-29 DIAGNOSIS — Z9889 Other specified postprocedural states: Secondary | ICD-10-CM | POA: Diagnosis not present

## 2024-01-29 DIAGNOSIS — Z8781 Personal history of (healed) traumatic fracture: Secondary | ICD-10-CM | POA: Diagnosis not present

## 2024-01-29 DIAGNOSIS — M25572 Pain in left ankle and joints of left foot: Secondary | ICD-10-CM | POA: Diagnosis not present

## 2024-02-05 ENCOUNTER — Other Ambulatory Visit: Payer: Self-pay | Admitting: Family Medicine

## 2024-02-05 DIAGNOSIS — E7800 Pure hypercholesterolemia, unspecified: Secondary | ICD-10-CM

## 2024-02-08 DIAGNOSIS — Z8781 Personal history of (healed) traumatic fracture: Secondary | ICD-10-CM | POA: Diagnosis not present

## 2024-02-08 DIAGNOSIS — S82852D Displaced trimalleolar fracture of left lower leg, subsequent encounter for closed fracture with routine healing: Secondary | ICD-10-CM | POA: Diagnosis not present

## 2024-02-08 DIAGNOSIS — Z9889 Other specified postprocedural states: Secondary | ICD-10-CM | POA: Diagnosis not present

## 2024-02-08 DIAGNOSIS — M25572 Pain in left ankle and joints of left foot: Secondary | ICD-10-CM | POA: Diagnosis not present

## 2024-02-11 DIAGNOSIS — M25572 Pain in left ankle and joints of left foot: Secondary | ICD-10-CM | POA: Diagnosis not present

## 2024-02-11 DIAGNOSIS — Z8781 Personal history of (healed) traumatic fracture: Secondary | ICD-10-CM | POA: Diagnosis not present

## 2024-02-11 DIAGNOSIS — Z9889 Other specified postprocedural states: Secondary | ICD-10-CM | POA: Diagnosis not present

## 2024-02-12 ENCOUNTER — Ambulatory Visit: Payer: Self-pay | Admitting: Family Medicine

## 2024-02-12 ENCOUNTER — Encounter: Payer: Self-pay | Admitting: Family Medicine

## 2024-02-12 VITALS — BP 116/62 | HR 56 | Temp 97.8°F | Ht 65.5 in | Wt 163.4 lb

## 2024-02-12 DIAGNOSIS — E7800 Pure hypercholesterolemia, unspecified: Secondary | ICD-10-CM | POA: Diagnosis not present

## 2024-02-12 DIAGNOSIS — R7301 Impaired fasting glucose: Secondary | ICD-10-CM | POA: Diagnosis not present

## 2024-02-12 DIAGNOSIS — E559 Vitamin D deficiency, unspecified: Secondary | ICD-10-CM | POA: Diagnosis not present

## 2024-02-12 DIAGNOSIS — E039 Hypothyroidism, unspecified: Secondary | ICD-10-CM | POA: Diagnosis not present

## 2024-02-12 DIAGNOSIS — M8580 Other specified disorders of bone density and structure, unspecified site: Secondary | ICD-10-CM

## 2024-02-12 DIAGNOSIS — Z0000 Encounter for general adult medical examination without abnormal findings: Secondary | ICD-10-CM | POA: Diagnosis not present

## 2024-02-12 DIAGNOSIS — Z23 Encounter for immunization: Secondary | ICD-10-CM

## 2024-02-12 DIAGNOSIS — Z1211 Encounter for screening for malignant neoplasm of colon: Secondary | ICD-10-CM

## 2024-02-12 DIAGNOSIS — S82852S Displaced trimalleolar fracture of left lower leg, sequela: Secondary | ICD-10-CM

## 2024-02-12 LAB — VITAMIN D 25 HYDROXY (VIT D DEFICIENCY, FRACTURES): VITD: 55.91 ng/mL (ref 30.00–100.00)

## 2024-02-12 LAB — COMPREHENSIVE METABOLIC PANEL WITH GFR
ALT: 15 U/L (ref 0–35)
AST: 19 U/L (ref 0–37)
Albumin: 4.1 g/dL (ref 3.5–5.2)
Alkaline Phosphatase: 92 U/L (ref 39–117)
BUN: 14 mg/dL (ref 6–23)
CO2: 30 meq/L (ref 19–32)
Calcium: 9.4 mg/dL (ref 8.4–10.5)
Chloride: 101 meq/L (ref 96–112)
Creatinine, Ser: 0.82 mg/dL (ref 0.40–1.20)
GFR: 71.93 mL/min (ref >60.00)
Glucose, Bld: 83 mg/dL (ref 70–99)
Potassium: 4.1 meq/L (ref 3.5–5.1)
Sodium: 140 meq/L (ref 135–145)
Total Bilirubin: 0.8 mg/dL (ref 0.2–1.2)
Total Protein: 7 g/dL (ref 6.0–8.3)

## 2024-02-12 LAB — LIPID PANEL
Cholesterol: 219 mg/dL — ABNORMAL HIGH (ref 0–200)
HDL: 68 mg/dL (ref >39.00)
LDL Cholesterol: 126 mg/dL — ABNORMAL HIGH (ref 0–99)
NonHDL: 151.45
Total CHOL/HDL Ratio: 3
Triglycerides: 128 mg/dL (ref 0.0–149.0)
VLDL: 25.6 mg/dL (ref 0.0–40.0)

## 2024-02-12 LAB — TSH: TSH: 4 u[IU]/mL (ref 0.35–5.50)

## 2024-02-12 LAB — HEMOGLOBIN A1C: Hgb A1c MFr Bld: 5.9 % (ref 4.6–6.5)

## 2024-02-12 NOTE — Progress Notes (Signed)
 Subjective:    Patient ID: Katrina [name], female    DOB: [birth year], 73 y.o.   MRN: [id number]  HPI  Here for health maintenance exam and to review chronic medical problems   Wt Readings from Last 3 Encounters:  02/12/24 163 lb 6 oz (74.1 kg)  11/23/23 160 lb 0.9 oz (72.6 kg)  04/17/23 158 lb (71.7 kg)   26.77 kg/m  Vitals:   02/12/24 1027  BP: 116/62  Pulse: (!) 56  Temp: 97.8 F (36.6 C)  SpO2: 98%    Immunization History  Administered Date(s) Administered   Fluad Quad(high Dose 65+) 12/29/2019, 01/03/2021, 01/06/2022   Fluad Trivalent(High Dose 65+) 01/26/2023   INFLUENZA, HIGH DOSE SEASONAL PF 02/05/2018, 02/12/2024   Influenza Whole 01/29/2007   Influenza,inj,Quad PF,6+ Mos 11/25/2018   Influenza-Unspecified 02/18/2013, 02/06/2014, 02/06/2016   Pneumococcal Conjugate-13 11/23/2017   Pneumococcal Polysaccharide-23 11/25/2018   Td 04/07/2001   Tdap 08/15/2011    Health Maintenance Due  Topic Date Due   Hepatitis C Screening  Never done   Flu shot- today   Tetanus shot - ? Not covered   Mammogram 06/2023 -had repeat views that were normal  Self breast exam- no lumps   Gyn health-no symptoms   Colon cancer screening  Colonoscopy 02/2018 with 10 y recall   Bone health  Dexa  04/2022  osteopenia  Falls- see below  Fractures ankle fractures in aug   (coming down steps in garage) /rug went out from under her / was a hard fall  Has surgery  Supplements -vitamin D  and ca  Last vitamin D  Lab Results  Component Value Date   VD25OH 26.96 (L) 08/07/2015    Exercise  PT for her ankle  Some upper body work also  Goes to the gym  3 d per week/ balance/core and yoga -is working on balance     Mood    02/12/2024   11:11 AM 04/17/2023   11:03 AM 01/26/2023    2:26 PM 05/01/2022    9:28 AM 01/06/2022    9:37 AM  Depression screen PHQ 2/9  Decreased Interest 0 0 0 0 0  Down, Depressed, Hopeless 0 0 0 0 0  PHQ - 2 Score 0 0 0 0 0  Altered sleeping 0   0  0  Tired, decreased energy 0  0  0  Change in appetite 0  0  0  Feeling bad or failure about yourself  0  0  0  Trouble concentrating 0  0  0  Moving slowly or fidgety/restless 0  0  0  Suicidal thoughts 0  0  0  PHQ-9 Score 0  0   0   Difficult doing work/chores Not difficult at all  Not difficult at all  Not difficult at all     Data saved with a previous flowsheet row definition    Hypothyroidism  Pt has no clinical changes No change in energy level/ hair or skin/ edema and no tremor Lab Results  Component Value Date   TSH 4.09 01/19/2023     Levothyroxine  88 mcg daily  Due for labs   Hyperlipidemia Lab Results  Component Value Date   CHOL 201 (H) 01/19/2023   HDL 55.60 01/19/2023   LDLCALC 117 (H) 01/19/2023   LDLDIRECT 184.1 03/28/2013   TRIG 143.0 01/19/2023   CHOLHDL 4 01/19/2023   Taking zetia  10 mg daily  Diet has been good   Lab Results  Component Value Date  CALCIUM  8.8 (L) 11/24/2023      Patient Active Problem List   Diagnosis Date Noted   Vitamin D  deficiency 02/12/2024   Impaired fasting glucose 11/24/2023   Fracture of ankle, trimalleolar, left, closed, sequela 11/23/2023   Allergic reaction caused by a drug 03/27/2023   Encounter for screening mammogram for breast cancer 01/06/2022   Encounter for routine gynecological examination 09/28/2015   Estrogen deficiency 09/28/2015   Colon cancer screening 03/28/2013   Routine general medical examination at a health care facility 08/15/2011   Post-menopausal 08/15/2011   Screening mammogram, encounter for 01/09/2011   Osteopenia 01/29/2007   Hypothyroid 01/12/2007   Hyperlipidemia 01/12/2007   HEEL SPUR 01/12/2007   Past Medical History:  Diagnosis Date   Actinic keratosis    Allergic reaction caused by a drug 03/27/2023   Asthma    Exercise induced.   Cataract 2022   Have had them removed in March 2024   Heel spur    HLD (hyperlipidemia)    Hypothyroid    Osteopenia    stress  fracture in foot   Osteoporosis    Osteopenia   Plantar fasciitis    Stress fracture of foot    Past Surgical History:  Procedure Laterality Date   CHONDROPLASTY Right 07/12/2021   Procedure: CHONDROPLASTY;  Surgeon: Tobie Priest, MD;  Location: [facility];  Service: Orthopedics;  Laterality: Right;   COLONOSCOPY  2020   EYE SURGERY  March 2024   Cataract removal on both eyes   FRACTURE SURGERY  11/20/23   Broken ankle   KNEE ARTHROSCOPY WITH MEDIAL MENISECTOMY Right 07/12/2021   Procedure: Right knee arthroscopic medial meniscus root repair;  Surgeon: Tobie Priest, MD;  Location: [facility];  Service: Orthopedics;  Laterality: Right;   KNEE ARTHROSCOPY WITH MENISCAL REPAIR Left 05/02/2020   Procedure: Left medial meniscus root repair and subchondroplasty of medial plateau with possible chondroplasty of the patella - Krystal Doyne to Assist;  Surgeon: Tobie Priest, MD;  Location: [facility];  Service: Orthopedics;  Laterality: Left;   ORIF ANKLE FRACTURE Left 11/23/2023   Procedure: OPEN REDUCTION INTERNAL FIXATION (ORIF) ANKLE FRACTURE;  Surgeon: Lennie Barter, DPM;  Location: [facility];  Service: Orthopedics/Podiatry;  Laterality: Left;   WISDOM TOOTH EXTRACTION     Social History   Tobacco Use   Smoking status: Never   Smokeless tobacco: Never  Vaping Use   Vaping status: Never Used  Substance Use Topics   Alcohol use: Not Currently    Comment: rarely   Drug use: Never   Family History  Problem Relation Age of Onset   Myelodysplastic syndrome Father    Hypertension Mother    Thyroid  disease Mother    Hearing loss Mother    Hyperlipidemia Mother    Intellectual disability Mother    Stroke Mother    Vision loss Mother    Cancer Other        GM--gallbladder   Lung cancer Other        GF   Colon cancer Other        GF   Osteoporosis Other        GM   Asthma Daughter    Breast cancer Neg Hx    Allergies  Allergen Reactions   Advil [Ibuprofen]     Not an allergy but  feels awful the next day, lethargic. Still takes it from time-to-time.   Amoxicillin -Pot Clavulanate Hives    Chest pain, itchy hives - likely clavulanate component as has  tolerated plain amoxicillin  in the past   Clindamycin/Lincomycin Other (See Comments)    Caused C-diff   Codeine Nausea And Vomiting   Crestor  [Rosuvastatin ]     Muscle pain   Current Outpatient Medications on File Prior to Visit  Medication Sig Dispense Refill   albuterol  (VENTOLIN  HFA) 108 (90 Base) MCG/ACT inhaler Inhale 2 puffs into the lungs every 4 (four) hours as needed for wheezing. 1 each 5   calcium  carbonate (OSCAL) 1500 (600 Ca) MG TABS tablet Take 600 mg of elemental calcium  by mouth daily at 12 noon.     Cholecalciferol (VITAMIN D -3) 125 MCG (5000 UT) TABS Take 5,000 Units by mouth daily with lunch.     COLLAGEN PO Take 1 Scoop by mouth daily.     ezetimibe  (ZETIA ) 10 MG tablet TAKE 1 TABLET BY MOUTH AT BEDTIME 90 tablet 0   fluticasone (FLONASE) 50 MCG/ACT nasal spray Place 1 spray into both nostrils daily.     levothyroxine  (SYNTHROID ) 88 MCG tablet TAKE 1 TABLET BY MOUTH ONCE DAILY BEFORE BREAKFAST 90 tablet 1   Multiple Vitamin (MULTIVITAMIN WITH MINERALS) TABS tablet Take 1 tablet by mouth daily with lunch.     vitamin C (ASCORBIC ACID) 500 MG tablet Take 500 mg by mouth daily with lunch.     No current facility-administered medications on file prior to visit.    Review of Systems  Constitutional:  Negative for activity change, appetite change, fatigue, fever and unexpected weight change.  HENT:  Negative for congestion, ear pain, rhinorrhea, sinus pressure and sore throat.   Eyes:  Negative for pain, redness and visual disturbance.  Respiratory:  Negative for cough, shortness of breath and wheezing.   Cardiovascular:  Negative for chest pain and palpitations.  Gastrointestinal:  Negative for abdominal pain, blood in stool, constipation and diarrhea.  Endocrine: Negative for polydipsia and  polyuria.  Genitourinary:  Negative for dysuria, frequency and urgency.  Musculoskeletal:  Positive for arthralgias. Negative for back pain and myalgias.  Skin:  Negative for pallor and rash.  Allergic/Immunologic: Negative for environmental allergies.  Neurological:  Negative for dizziness, syncope and headaches.  Hematological:  Negative for adenopathy. Does not bruise/bleed easily.  Psychiatric/Behavioral:  Negative for decreased concentration and dysphoric mood. The patient is not nervous/anxious.        Objective:   Physical Exam Constitutional:      General: She is not in acute distress.    Appearance: Normal appearance. She is well-developed and normal weight. She is not ill-appearing or diaphoretic.  HENT:     Head: Normocephalic and atraumatic.     Right Ear: Tympanic membrane, ear canal and external ear normal.     Left Ear: Tympanic membrane, ear canal and external ear normal.     Nose: Nose normal. No congestion.     Mouth/Throat:     Mouth: Mucous membranes are moist.     Pharynx: Oropharynx is clear. No posterior oropharyngeal erythema.  Eyes:     General: No scleral icterus.    Extraocular Movements: Extraocular movements intact.     Conjunctiva/sclera: Conjunctivae normal.     Pupils: Pupils are equal, round, and reactive to light.  Neck:     Thyroid : No thyromegaly.     Vascular: No carotid bruit or JVD.  Cardiovascular:     Rate and Rhythm: Normal rate and regular rhythm.     Pulses: Normal pulses.     Heart sounds: Normal heart sounds.     No gallop.  Pulmonary:     Effort: Pulmonary effort is normal. No respiratory distress.     Breath sounds: Normal breath sounds. No wheezing.     Comments: Good air exch Chest:     Chest wall: No tenderness.  Abdominal:     General: Bowel sounds are normal. There is no distension or abdominal bruit.     Palpations: Abdomen is soft. There is no mass.     Tenderness: There is no abdominal tenderness.     Hernia: No  hernia is present.  Genitourinary:    Comments: Breast exam: No mass, nodules, thickening, tenderness, bulging, retraction, inflamation, nipple discharge or skin changes noted.  No axillary or clavicular LA.     Musculoskeletal:        General: No tenderness. Normal range of motion.     Cervical back: Normal range of motion and neck supple. No rigidity. No muscular tenderness.     Right lower leg: No edema.     Left lower leg: No edema.     Comments: No kyphosis   Left ankle in brace   Lymphadenopathy:     Cervical: No cervical adenopathy.  Skin:    General: Skin is warm and dry.     Coloration: Skin is not pale.     Findings: No erythema or rash.     Comments: Fair complexion Solar lentigines diffusely   Neurological:     Mental Status: She is alert. Mental status is at baseline.     Cranial Nerves: No cranial nerve deficit.     Motor: No abnormal muscle tone.     Coordination: Coordination normal.     Gait: Gait normal.     Deep Tendon Reflexes: Reflexes are normal and symmetric. Reflexes normal.  Psychiatric:        Mood and Affect: Mood normal.        Cognition and Memory: Cognition and memory normal.           Assessment & Plan:   Problem List Items Addressed This Visit       Endocrine   Impaired fasting glucose   A1c ordered Good diet and exercise       Relevant Orders   Hemoglobin A1c   Hypothyroid   TSH today  Taking levothyroxine  88 mcg daily  No clinical changes         Relevant Orders   TSH     Musculoskeletal and Integument   Osteopenia   Dexa 04/2022  Will call for order when due   Had a fall/fracture  Encouraged to consider medication/info given on PT  Doing balance and strength building exercise   Interested in a program/therapy-will call with the name   Discussed fall prevention, supplements and exercise for bone density  D level today      Fracture of ankle, trimalleolar, left, closed, sequela   In a brace/with cane and  doing PT  Doing well overall         Other   Vitamin D  deficiency   D level today  Taking D3   Had a fracture this year       Relevant Orders   VITAMIN D  25 Hydroxy (Vit-D Deficiency, Fractures)   Routine general medical examination at a health care facility - Primary   Reviewed health habits including diet and exercise and skin cancer prevention Reviewed appropriate screening tests for age  Also reviewed health mt list, fam hx and immunization status , as well as social and family  history   See HPI Labs reviewed and ordered Health Maintenance  Topic Date Due   Hepatitis C Screening  Never done   Flu Shot  11/06/2023   DTaP/Tdap/Td vaccine (3 - Td or Tdap) 02/11/2025*   Zoster (Shingles) Vaccine (1 of 2) 05/14/2025*   COVID-19 Vaccine (1) 02/27/2026*   Medicare Annual Wellness Visit  04/16/2024   Breast Cancer Screening  06/22/2024   Colon Cancer Screening  02/10/2028   Pneumococcal Vaccine for age over 67  Completed   DEXA scan (bone density measurement)  Completed   Meningitis B Vaccine  Aged Out  *Topic was postponed. The date shown is not the original due date.    Discussed fall prevention, supplements and exercise for bone density  Will check on price of Td at pharmacy Flu shot today  PHQ 0       Hyperlipidemia   Disc goals for lipids and reasons to control them Rev last labs with pt Rev low sat fat diet in detail  Lab today Good diet  On zetia  10 mg daily       Relevant Orders   Comprehensive metabolic panel with GFR   Lipid Panel   Colon cancer screening   Colonoscopy 2019 with 10 y recall      Other Visit Diagnoses       Need for influenza vaccination       Relevant Orders   Flu vaccine HIGH DOSE PF(Fluzone Trivalent) (Completed)

## 2024-02-12 NOTE — Assessment & Plan Note (Signed)
 Dexa 04/2022  Will call for order when due   Had a fall/fracture  Encouraged to consider medication/info given on PT  Doing balance and strength building exercise   Interested in a program/therapy-will call with the name   Discussed fall prevention, supplements and exercise for bone density  D level today

## 2024-02-12 NOTE — Assessment & Plan Note (Signed)
 Reviewed health habits including diet and exercise and skin cancer prevention Reviewed appropriate screening tests for age  Also reviewed health mt list, fam hx and immunization status , as well as social and family history   See HPI Labs reviewed and ordered Health Maintenance  Topic Date Due   Hepatitis C Screening  Never done   Flu Shot  11/06/2023   DTaP/Tdap/Td vaccine (3 - Td or Tdap) 02/11/2025*   Zoster (Shingles) Vaccine (1 of 2) 05/14/2025*   COVID-19 Vaccine (1) 02/27/2026*   Medicare Annual Wellness Visit  04/16/2024   Breast Cancer Screening  06/22/2024   Colon Cancer Screening  02/10/2028   Pneumococcal Vaccine for age over 65  Completed   DEXA scan (bone density measurement)  Completed   Meningitis B Vaccine  Aged Out  *Topic was postponed. The date shown is not the original due date.    Discussed fall prevention, supplements and exercise for bone density  Will check on price of Td at pharmacy Flu shot today  PHQ 0

## 2024-02-12 NOTE — Assessment & Plan Note (Signed)
 In a brace/with cane and doing PT  Doing well overall

## 2024-02-12 NOTE — Patient Instructions (Addendum)
 Add some strength training to your routine, this is important for bone and brain health and can reduce your risk of falls and help your body use insulin properly and regulate weight  Light weights, exercise bands , and internet videos are a good way to start  Yoga (chair or regular), machines , floor exercises or a gym with machines are also good options    Let us  know about the program you are interested in / PT /for bones   Check at pharmacy about tetanus shot   You are due for bone density test after jan 1  Call us  when you are ready to schedule it   Read about alendronate for bones   Labs today  Flu shot today

## 2024-02-12 NOTE — Assessment & Plan Note (Signed)
Colonoscopy 2019 with 10 y recall

## 2024-02-12 NOTE — Assessment & Plan Note (Signed)
TSH today  Taking levothyroxine 88 mcg daily  No clinical changes  

## 2024-02-12 NOTE — Assessment & Plan Note (Signed)
 D level today  Taking D3   Had a fracture this year

## 2024-02-12 NOTE — Assessment & Plan Note (Signed)
 Disc goals for lipids and reasons to control them Rev last labs with pt Rev low sat fat diet in detail  Lab today Good diet  On zetia  10 mg daily

## 2024-02-12 NOTE — Assessment & Plan Note (Signed)
 A1c ordered Good diet and exercise

## 2024-02-15 DIAGNOSIS — Z8781 Personal history of (healed) traumatic fracture: Secondary | ICD-10-CM | POA: Diagnosis not present

## 2024-02-15 DIAGNOSIS — M25572 Pain in left ankle and joints of left foot: Secondary | ICD-10-CM | POA: Diagnosis not present

## 2024-02-15 DIAGNOSIS — Z9889 Other specified postprocedural states: Secondary | ICD-10-CM | POA: Diagnosis not present

## 2024-02-16 ENCOUNTER — Ambulatory Visit: Payer: Medicare Other | Admitting: Dermatology

## 2024-02-18 ENCOUNTER — Ambulatory Visit: Admitting: Dermatology

## 2024-02-18 DIAGNOSIS — D1801 Hemangioma of skin and subcutaneous tissue: Secondary | ICD-10-CM

## 2024-02-18 DIAGNOSIS — W908XXA Exposure to other nonionizing radiation, initial encounter: Secondary | ICD-10-CM

## 2024-02-18 DIAGNOSIS — L821 Other seborrheic keratosis: Secondary | ICD-10-CM

## 2024-02-18 DIAGNOSIS — Z1283 Encounter for screening for malignant neoplasm of skin: Secondary | ICD-10-CM | POA: Diagnosis not present

## 2024-02-18 DIAGNOSIS — D229 Melanocytic nevi, unspecified: Secondary | ICD-10-CM

## 2024-02-18 DIAGNOSIS — L300 Nummular dermatitis: Secondary | ICD-10-CM

## 2024-02-18 DIAGNOSIS — Z9889 Other specified postprocedural states: Secondary | ICD-10-CM | POA: Diagnosis not present

## 2024-02-18 DIAGNOSIS — L578 Other skin changes due to chronic exposure to nonionizing radiation: Secondary | ICD-10-CM | POA: Diagnosis not present

## 2024-02-18 DIAGNOSIS — L820 Inflamed seborrheic keratosis: Secondary | ICD-10-CM | POA: Diagnosis not present

## 2024-02-18 DIAGNOSIS — Z8781 Personal history of (healed) traumatic fracture: Secondary | ICD-10-CM | POA: Diagnosis not present

## 2024-02-18 DIAGNOSIS — L814 Other melanin hyperpigmentation: Secondary | ICD-10-CM | POA: Diagnosis not present

## 2024-02-18 DIAGNOSIS — L570 Actinic keratosis: Secondary | ICD-10-CM

## 2024-02-18 DIAGNOSIS — M25572 Pain in left ankle and joints of left foot: Secondary | ICD-10-CM | POA: Diagnosis not present

## 2024-02-18 MED ORDER — MOMETASONE FUROATE 0.1 % EX CREA: 1.0000 | TOPICAL_CREAM | Freq: Every day | CUTANEOUS | 2 refills | Status: AC | PRN

## 2024-02-18 NOTE — Patient Instructions (Signed)
 Cryotherapy Aftercare  Wash gently with soap and water  everyday.   Apply Vaseline and Band-Aid daily until healed.   Recommend daily broad spectrum sunscreen SPF 30+ to sun-exposed areas, reapply every 2 hours as needed. Call for new or changing lesions.  Staying in the shade or wearing long sleeves, sun glasses (UVA+UVB protection) and wide brim hats (4-inch brim around the entire circumference of the hat) are also recommended for sun protection.   Topical steroids (such as triamcinolone , fluocinolone, fluocinonide, mometasone , clobetasol, halobetasol, betamethasone, hydrocortisone) can cause thinning and lightening of the skin if they are used for too long in the same area. Your physician has selected the right strength medicine for your problem and area affected on the body. Please use your medication only as directed by your physician to prevent side effects.   Melanoma ABCDEs  Melanoma is the most dangerous type of skin cancer, and is the leading cause of death from skin disease.  You are more likely to develop melanoma if you: Have light-colored skin, light-colored eyes, or red or blond hair Spend a lot of time in the sun Tan regularly, either outdoors or in a tanning bed Have had blistering sunburns, especially during childhood Have a close family member who has had a melanoma Have atypical moles or large birthmarks  Early detection of melanoma is key since treatment is typically straightforward and cure rates are extremely high if we catch it early.   The first sign of melanoma is often a change in a mole or a new dark spot.  The ABCDE system is a way of remembering the signs of melanoma.  A for asymmetry:  The two halves do not match. B for border:  The edges of the growth are irregular. C for color:  A mixture of colors are present instead of an even brown color. D for diameter:  Melanomas are usually (but not always) greater than 6mm - the size of a pencil eraser. E for  evolution:  The spot keeps changing in size, shape, and color.  Please check your skin once per month between visits. You can use a small mirror in front and a large mirror behind you to keep an eye on the back side or your body.   If you see any new or changing lesions before your next follow-up, please call to schedule a visit.  Please continue daily skin protection including broad spectrum sunscreen SPF 30+ to sun-exposed areas, reapplying every 2 hours as needed when you're outdoors.     Due to recent changes in healthcare laws, you may see results of your pathology and/or laboratory studies on MyChart before the doctors have had a chance to review them. We understand that in some cases there may be results that are confusing or concerning to you. Please understand that not all results are received at the same time and often the doctors may need to interpret multiple results in order to provide you with the best plan of care or course of treatment. Therefore, we ask that you please give us  2 business days to thoroughly review all your results before contacting the office for clarification. Should we see a critical lab result, you will be contacted sooner.   If You Need Anything After Your Visit  If you have any questions or concerns for your doctor, please call our main line at [phone] and press option 4 to reach your doctor's medical assistant. If no one answers, please leave a voicemail as directed and we  will return your call as soon as possible. Messages left after 4 pm will be answered the following business day.   You may also send us  a message via MyChart. We typically respond to MyChart messages within 1-2 business days.  For prescription refills, please ask your pharmacy to contact our office. Our fax number is [phone].  If you have an urgent issue when the clinic is closed that cannot wait until the next business day, you can page your doctor at the number below.     Please note that while we do our best to be available for urgent issues outside of office hours, we are not available 24/7.   If you have an urgent issue and are unable to reach us , you may choose to seek medical care at your doctor's office, retail clinic, urgent care center, or emergency room.  If you have a medical emergency, please immediately call 911 or go to the emergency department.  Pager Numbers  - Dr. Hester: [phone]  - Dr. Jackquline: [phone]  - Dr. Claudene: [phone]   - Dr. Raymund: [phone]  In the event of inclement weather, please call our main line at [phone] for an update on the status of any delays or closures.  Dermatology Medication Tips: Please keep the boxes that topical medications come in in order to help keep track of the instructions about where and how to use these. Pharmacies typically print the medication instructions only on the boxes and not directly on the medication tubes.   If your medication is too expensive, please contact our office at [phone] option 4 or send us  a message through MyChart.   We are unable to tell what your co-pay for medications will be in advance as this is different depending on your insurance coverage. However, we may be able to find a substitute medication at lower cost or fill out paperwork to get insurance to cover a needed medication.   If a prior authorization is required to get your medication covered by your insurance company, please allow us  1-2 business days to complete this process.  Drug prices often vary depending on where the prescription is filled and some pharmacies may offer cheaper prices.  The website www.goodrx.com contains coupons for medications through different pharmacies. The prices here do not account for what the cost may be with help from insurance (it may be cheaper with your insurance), but the website can give you the price if you did not use any insurance.  - You can print  the associated coupon and take it with your prescription to the pharmacy.  - You may also stop by our office during regular business hours and pick up a GoodRx coupon card.  - If you need your prescription sent electronically to a different pharmacy, notify our office through [facility] or by phone at [phone] option 4.     Si Usted Necesita Algo Despus de Su Visita  Tambin puede enviarnos un mensaje a travs de Clinical Cytogeneticist. Por lo general respondemos a los mensajes de MyChart en el transcurso de 1 a 2 das hbiles.  Para renovar recetas, por favor pida a su farmacia que se ponga en contacto con nuestra oficina. Randi lakes de fax es [place] [phone].  Si tiene un asunto urgente cuando la clnica est cerrada y que no puede esperar hasta el siguiente da hbil, puede llamar/localizar a su doctor(a) al nmero que aparece a continuacin.   Por favor, tenga en cuenta que aunque hacemos todo  lo posible para estar disponibles para asuntos urgentes fuera del horario de oficina, no estamos disponibles las 24 horas del da, los 7 [address]  [address] de la [place].   Si tiene un problema urgente y no puede comunicarse con nosotros, puede optar por buscar atencin mdica  en el consultorio de su doctor(a), en una clnica privada, en un centro de atencin urgente o en una sala de emergencias.  Si tiene engineer, drilling, por favor llame inmediatamente al 911 o vaya a la sala de emergencias.  Nmeros de bper  - Dr. Hester: [phone]  - Dra. Jackquline: [phone]  - Dr. Claudene: [phone]  - Dra. Kitts: [phone]  En caso de inclemencias del [place], por favor llame a nuestra lnea principal al [phone] para una actualizacin sobre el estado de cualquier retraso o cierre.  Consejos para la medicacin en dermatologa: Por favor, guarde las cajas en las que vienen los medicamentos de uso tpico para ayudarle a seguir las instrucciones sobre dnde y cmo usarlos. Las farmacias  generalmente imprimen las instrucciones del medicamento slo en las cajas y no directamente en los tubos del [place].   Si su medicamento es muy caro, por favor, pngase en contacto con landry rieger llamando al [phone] y presione la opcin 4 o envenos un mensaje a travs de Clinical Cytogeneticist.   No podemos decirle cul ser su copago por los medicamentos por adelantado ya que esto es diferente dependiendo de la cobertura de su seguro. Sin embargo, es posible que podamos encontrar un medicamento sustituto a audiological scientist un formulario para que el seguro cubra el medicamento que se considera necesario.   Si se requiere una autorizacin previa para que su compaa de seguros cubra su medicamento, por favor permtanos de 1 a 2 das hbiles para completar este proceso.  Los precios de los medicamentos varan con frecuencia dependiendo del environmental consultant de dnde se surte la receta y alguna farmacias pueden ofrecer precios ms baratos.  El sitio web www.goodrx.com tiene cupones para medicamentos de health and safety inspector. Los precios aqu no tienen en cuenta lo que podra costar con la ayuda del seguro (puede ser ms barato con su seguro), pero el sitio web puede darle el precio si no utiliz tourist information centre manager.  - Puede imprimir el cupn correspondiente y llevarlo con su receta a la farmacia.  - Tambin puede pasar por nuestra oficina durante el horario de atencin regular y education officer, museum una tarjeta de cupones de GoodRx.  - Si necesita que su receta se enve electrnicamente a una farmacia diferente, informe a nuestra oficina a travs de MyChart de [place] o por telfono llamando al [phone] y presione la opcin 4.

## 2024-02-18 NOTE — Progress Notes (Signed)
 Follow-Up Visit   Subjective  Katrina [name] is a 73 y.o. female who presents for the following: Skin Cancer Screening and Full Body Skin Exam; Hx of AK's. Patient reports area of concern on her face and right lower extremity.   The patient presents for Total-Body Skin Exam (TBSE) for skin cancer screening and mole check. The patient has spots, moles and lesions to be evaluated, some may be new or changing and the patient may have concern these could be cancer.    The following portions of the chart were reviewed this encounter and updated as appropriate: medications, allergies, medical history  Review of Systems:  No other skin or systemic complaints except as noted in HPI or Assessment and Plan.  Objective  Well appearing patient in no apparent distress; mood and affect are within normal limits.  A full examination was performed including scalp, head, eyes, ears, nose, lips, neck, chest, axillae, abdomen, back, buttocks, bilateral upper extremities, bilateral lower extremities, hands, feet, fingers, toes, fingernails, and toenails. All findings within normal limits unless otherwise noted below.   Relevant physical exam findings are noted in the Assessment and Plan.       Right paranasal x1, Left temple x1, Right cheek x3, Right nasal dorsum x1, Left upper arm x1 (7) Pink scaly macules Left Spinal Upper Back x1, Left lateral thigh x1, Right chest x1, Left popliteal x1 (4) Stuck on waxy papules with erythema  R lower pretibia waxy pink macule, Benign features under dermoscopy. - see photos, not treated today with cryo  Assessment & Plan   SKIN CANCER SCREENING PERFORMED TODAY.  ACTINIC DAMAGE WITH PRECANCEROUS ACTINIC KERATOSES Counseling for Topical Chemotherapy Management: Patient exhibits: - Severe, confluent actinic changes with pre-cancerous actinic keratoses that is secondary to cumulative UV radiation exposure over time - Condition that is severe; chronic, not at  goal. - diffuse scaly erythematous macules and papules with underlying dyspigmentation - Discussed Prescription Field Treatment topical Chemotherapy for Severe, Chronic Confluent Actinic Changes with Pre-Cancerous Actinic Keratoses Field treatment involves treatment of an entire area of skin that has confluent Actinic Changes (Sun/ Ultraviolet light damage) and PreCancerous Actinic Keratoses by method of PhotoDynamic Therapy (PDT) and/or prescription Topical Chemotherapy agents such as 5-fluorouracil, 5-fluorouracil/calcipotriene, and/or imiquimod.  The purpose is to decrease the number of clinically evident and subclinical PreCancerous lesions to prevent progression to development of skin cancer by chemically destroying early precancer changes that may or may not be visible.  It has been shown to reduce the risk of developing skin cancer in the treated area. As a result of treatment, redness, scaling, crusting, and open sores may occur during treatment course. One or more than one of these methods may be used and may have to be used several times to control, suppress and eliminate the PreCancerous changes. Discussed treatment course, expected reaction, and possible side effects. - Recommend daily broad spectrum sunscreen SPF 30+ to sun-exposed areas, reapply every 2 hours as needed.  - Staying in the shade or wearing long sleeves, sun glasses (UVA+UVB protection) and wide brim hats (4-inch brim around the entire circumference of the hat) are also recommended. - Call for new or changing lesions.  - Recommend red light PDT with debridement to face.  Patient will schedule.    LENTIGINES, SEBORRHEIC KERATOSES, HEMANGIOMAS - Benign normal skin lesions - Benign-appearing - Call for any changes  MELANOCYTIC NEVI - Tan-brown and/or pink-flesh-colored symmetric macules and papules - Benign appearing on exam today - Observation - Call  clinic for new or changing moles - Recommend daily use of broad  spectrum spf 30+ sunscreen to sun-exposed areas.   Nummular Dermatitis Exam: Pink scaly patches at the posterior neck and right upper back, pt c/o itching  Chronic and persistent condition with duration or expected duration over one year. Condition is bothersome/symptomatic for patient. Currently flared.  Nummular dermatitis (eczema) is a chronic, relapsing, itchy rash that can significantly affect quality of life. It is often associated with dry skin and flares in the wintertime, and may require treatment with prescription topical anti-inflammatory medications, in addition to gentle skin care.  If there is associated atopic dermatitis and topicals are not working, then biologic injections may be necessary to clear rash and control symptoms.  Treatment Plan: - Start Mometasone  0.1% cream apply topically to affected areas twice daily until cleared - Recommend mild soap and moisturizing cream 1-2 times daily.  Gentle skin care handout provided.    ACTINIC KERATOSIS (7) Right paranasal x1, Left temple x1, Right cheek x3, Right nasal dorsum x1, Left upper arm x1 (7) Recheck left upper arm at next visit  Actinic keratoses are precancerous spots that appear secondary to cumulative UV radiation exposure/sun exposure over time. They are chronic with expected duration over 1 year. A portion of actinic keratoses will progress to squamous cell carcinoma of the skin. It is not possible to reliably predict which spots will progress to skin cancer and so treatment is recommended to prevent development of skin cancer.  Recommend daily broad spectrum sunscreen SPF 30+ to sun-exposed areas, reapply every 2 hours as needed.  Recommend staying in the shade or wearing long sleeves, sun glasses (UVA+UVB protection) and wide brim hats (4-inch brim around the entire circumference of the hat). Call for new or changing lesions. Destruction of lesion - Right paranasal x1, Left temple x1, Right cheek x3, Right nasal  dorsum x1, Left upper arm x1 (7)  Destruction method: cryotherapy   Informed consent: discussed and consent obtained   Lesion destroyed using liquid nitrogen: Yes   Region frozen until ice ball extended beyond lesion: Yes   Outcome: patient tolerated procedure well with no complications   Post-procedure details: wound care instructions given   Additional details:  Prior to procedure, discussed risks of blister formation, small wound, skin dyspigmentation, or rare scar following cryotherapy. Recommend Vaseline ointment to treated areas while healing.   INFLAMED SEBORRHEIC KERATOSIS (4) Left Spinal Upper Back x1, Left lateral thigh x1, Right chest x1, Left popliteal x1 (4) Symptomatic, irritating, patient would like treated.  Pt will apply mometasone  cream twice daily to R lower pretibia x 2 weeks, recheck on f/up Destruction of lesion - Left Spinal Upper Back x1, Left lateral thigh x1, Right chest x1, Left popliteal x1 (4)  Destruction method: cryotherapy   Informed consent: discussed and consent obtained   Lesion destroyed using liquid nitrogen: Yes   Region frozen until ice ball extended beyond lesion: Yes   Outcome: patient tolerated procedure well with no complications   Post-procedure details: wound care instructions given   Additional details:  Prior to procedure, discussed risks of blister formation, small wound, skin dyspigmentation, or rare scar following cryotherapy. Recommend Vaseline ointment to treated areas while healing.   Return in about 1 year (around 02/17/2025) for TBSE.  I, Emerick Ege, CMA am acting as scribe for Rexene Rattler, MD.   Documentation: I have reviewed the above documentation for accuracy and completeness, and I agree with the above.  Rexene Rattler, MD

## 2024-02-22 DIAGNOSIS — M25572 Pain in left ankle and joints of left foot: Secondary | ICD-10-CM | POA: Diagnosis not present

## 2024-02-22 DIAGNOSIS — Z8781 Personal history of (healed) traumatic fracture: Secondary | ICD-10-CM | POA: Diagnosis not present

## 2024-02-22 DIAGNOSIS — Z9889 Other specified postprocedural states: Secondary | ICD-10-CM | POA: Diagnosis not present

## 2024-02-25 DIAGNOSIS — Z8781 Personal history of (healed) traumatic fracture: Secondary | ICD-10-CM | POA: Diagnosis not present

## 2024-02-25 DIAGNOSIS — Z9889 Other specified postprocedural states: Secondary | ICD-10-CM | POA: Diagnosis not present

## 2024-02-25 DIAGNOSIS — M25572 Pain in left ankle and joints of left foot: Secondary | ICD-10-CM | POA: Diagnosis not present

## 2024-02-29 DIAGNOSIS — M25572 Pain in left ankle and joints of left foot: Secondary | ICD-10-CM | POA: Diagnosis not present

## 2024-02-29 DIAGNOSIS — Z9889 Other specified postprocedural states: Secondary | ICD-10-CM | POA: Diagnosis not present

## 2024-02-29 DIAGNOSIS — Z8781 Personal history of (healed) traumatic fracture: Secondary | ICD-10-CM | POA: Diagnosis not present

## 2024-03-08 DIAGNOSIS — Z8781 Personal history of (healed) traumatic fracture: Secondary | ICD-10-CM | POA: Diagnosis not present

## 2024-03-08 DIAGNOSIS — Z9889 Other specified postprocedural states: Secondary | ICD-10-CM | POA: Diagnosis not present

## 2024-03-08 DIAGNOSIS — M25572 Pain in left ankle and joints of left foot: Secondary | ICD-10-CM | POA: Diagnosis not present

## 2024-03-15 DIAGNOSIS — M25572 Pain in left ankle and joints of left foot: Secondary | ICD-10-CM | POA: Diagnosis not present

## 2024-03-15 DIAGNOSIS — Z9889 Other specified postprocedural states: Secondary | ICD-10-CM | POA: Diagnosis not present

## 2024-03-15 DIAGNOSIS — Z8781 Personal history of (healed) traumatic fracture: Secondary | ICD-10-CM | POA: Diagnosis not present

## 2024-03-22 DIAGNOSIS — Z9889 Other specified postprocedural states: Secondary | ICD-10-CM | POA: Diagnosis not present

## 2024-03-22 DIAGNOSIS — Z8781 Personal history of (healed) traumatic fracture: Secondary | ICD-10-CM | POA: Diagnosis not present

## 2024-03-22 DIAGNOSIS — M25572 Pain in left ankle and joints of left foot: Secondary | ICD-10-CM | POA: Diagnosis not present

## 2024-03-23 DIAGNOSIS — R6889 Other general symptoms and signs: Secondary | ICD-10-CM | POA: Diagnosis not present

## 2024-03-23 DIAGNOSIS — J101 Influenza due to other identified influenza virus with other respiratory manifestations: Secondary | ICD-10-CM | POA: Diagnosis not present

## 2024-04-19 ENCOUNTER — Ambulatory Visit: Payer: Medicare Other

## 2024-04-19 VITALS — Ht 65.5 in | Wt 161.0 lb

## 2024-04-19 DIAGNOSIS — Z0000 Encounter for general adult medical examination without abnormal findings: Secondary | ICD-10-CM | POA: Diagnosis not present

## 2024-04-19 NOTE — Progress Notes (Signed)
 "  Chief Complaint  Patient presents with   Medicare Wellness     Subjective:   Katrina [name] is a 74 y.o. female who presents for a Medicare Annual Wellness Visit.  Visit info / Clinical Intake: Medicare Wellness Visit Type:: Subsequent Annual Wellness Visit Persons participating in visit and providing information:: patient Medicare Wellness Visit Mode:: Telephone If telephone:: video declined Since this visit was completed virtually, some vitals may be partially provided or unavailable. Missing vitals are due to the limitations of the virtual format.: Unable to obtain vitals - no equipment If Telephone or Video please confirm:: I connected with patient using audio/video enable telemedicine. I verified patient identity with two identifiers, discussed telehealth limitations, and patient agreed to proceed. Patient Location:: hone Provider Location:: clinic Interpreter Needed?: No Pre-visit prep was completed: yes AWV questionnaire completed by patient prior to visit?: yes Date:: 04/15/24 Living arrangements:: (Patient-Rptd) lives with spouse/significant other Patient's Overall Health Status Rating: (Patient-Rptd) excellent Typical amount of pain: (Patient-Rptd) none Does pain affect daily life?: (Patient-Rptd) no Are you currently prescribed opioids?: no  Dietary Habits and Nutritional Risks How many meals a day?: (Patient-Rptd) 3 Eats fruit and vegetables daily?: (Patient-Rptd) yes Most meals are obtained by: (Patient-Rptd) preparing own meals In the last 2 weeks, have you had any of the following?: none Diabetic:: no  Functional Status Activities of Daily Living (to include ambulation/medication): (Patient-Rptd) Independent Ambulation: (Patient-Rptd) Independent Medication Administration: (Patient-Rptd) Independent Home Management (perform basic housework or laundry): (Patient-Rptd) Independent Manage your own finances?: (Patient-Rptd) yes Primary transportation is:  (Patient-Rptd) driving Concerns about vision?: no *vision screening is required for WTM* Concerns about hearing?: no  Fall Screening Falls in the past year?: 1 (11/20/23 broke left ankle in 3 places) Number of falls in past year: (Patient-Rptd) 0 Was there an injury with Fall?: (Patient-Rptd) 1 Fall Risk Category Calculator: (Patient-Rptd) 2 Patient Fall Risk Level: (Patient-Rptd) Moderate Fall Risk  Fall Risk Patient at Risk for Falls Due to: History of fall(s) Fall risk Follow up: Falls evaluation completed; Education provided; Falls prevention discussed  Home and Transportation Safety: All rugs have non-skid backing?: (Patient-Rptd) yes All stairs or steps have railings?: (Patient-Rptd) yes Grab bars in the bathtub or shower?: (Patient-Rptd) yes Have non-skid surface in bathtub or shower?: (Patient-Rptd) yes Good home lighting?: (Patient-Rptd) yes Regular seat belt use?: (Patient-Rptd) yes Hospital stays in the last year:: (!) (Patient-Rptd) yes How many hospital stays:: (Patient-Rptd) 1  Cognitive Assessment Difficulty concentrating, remembering, or making decisions? : (Patient-Rptd) no Will 6CIT or Mini Cog be Completed: yes What year is it?: 0 points What month is it?: 0 points Give patient an address phrase to remember (5 components): [address] [place]  About what time is it?: 0 points Count backwards from 20 to 1: 0 points Say the months of the year in reverse: 0 points Repeat the address phrase from earlier: 0 points 6 CIT Score: 0 points  Advance Directives (For Healthcare) Does Patient Have a Medical Advance Directive?: No Would patient like information on creating a medical advance directive?: -- (In process)  Reviewed/Updated  Reviewed/Updated: Reviewed All (Medical, Surgical, Family, Medications, Allergies, Care Teams, Patient Goals)   Allergies (verified) Advil [ibuprofen], Amoxicillin -pot clavulanate, Clindamycin/lincomycin, Codeine, and  Crestor  [rosuvastatin ]   Current Medications (verified) Outpatient Encounter Medications as of 04/19/2024  Medication Sig   albuterol  (VENTOLIN  HFA) 108 (90 Base) MCG/ACT inhaler Inhale 2 puffs into the lungs every 4 (four) hours as needed for wheezing.   calcium  carbonate (OSCAL)  1500 (600 Ca) MG TABS tablet Take 600 mg of elemental calcium  by mouth daily at 12 noon.   Cholecalciferol (VITAMIN D -3) 125 MCG (5000 UT) TABS Take 5,000 Units by mouth daily with lunch.   COLLAGEN PO Take 1 Scoop by mouth daily.   ezetimibe  (ZETIA ) 10 MG tablet TAKE 1 TABLET BY MOUTH AT BEDTIME   fluticasone (FLONASE) 50 MCG/ACT nasal spray Place 1 spray into both nostrils daily.   levothyroxine  (SYNTHROID ) 88 MCG tablet TAKE 1 TABLET BY MOUTH ONCE DAILY BEFORE BREAKFAST   mometasone  (ELOCON ) 0.1 % cream Apply 1 Application topically daily as needed (Rash).   Multiple Vitamin (MULTIVITAMIN WITH MINERALS) TABS tablet Take 1 tablet by mouth daily with lunch.   vitamin C (ASCORBIC ACID) 500 MG tablet Take 500 mg by mouth daily with lunch.   No facility-administered encounter medications on file as of 04/19/2024.    History: Past Medical History:  Diagnosis Date   Actinic keratosis    Allergic reaction caused by a drug 03/27/2023   Asthma    Exercise induced.   Cataract 2022   Have had them removed in March 2024   Heel spur    HLD (hyperlipidemia)    Hypothyroid    Osteopenia    stress fracture in foot   Osteoporosis    Osteopenia   Plantar fasciitis    Stress fracture of foot    Past Surgical History:  Procedure Laterality Date   CHONDROPLASTY Right 07/12/2021   Procedure: CHONDROPLASTY;  Surgeon: Tobie Priest, MD;  Location: [facility];  Service: Orthopedics;  Laterality: Right;   COLONOSCOPY  2020   EYE SURGERY  March 2024   Cataract removal on both eyes   FRACTURE SURGERY  11/20/23   Broken ankle   KNEE ARTHROSCOPY WITH MEDIAL MENISECTOMY Right 07/12/2021   Procedure: Right knee arthroscopic  medial meniscus root repair;  Surgeon: Tobie Priest, MD;  Location: [facility];  Service: Orthopedics;  Laterality: Right;   KNEE ARTHROSCOPY WITH MENISCAL REPAIR Left 05/02/2020   Procedure: Left medial meniscus root repair and subchondroplasty of medial plateau with possible chondroplasty of the patella - Krystal Doyne to Assist;  Surgeon: Tobie Priest, MD;  Location: [facility];  Service: Orthopedics;  Laterality: Left;   ORIF ANKLE FRACTURE Left 11/23/2023   Procedure: OPEN REDUCTION INTERNAL FIXATION (ORIF) ANKLE FRACTURE;  Surgeon: Lennie Barter, DPM;  Location: [facility];  Service: Orthopedics/Podiatry;  Laterality: Left;   WISDOM TOOTH EXTRACTION     Family History  Problem Relation Age of Onset   Myelodysplastic syndrome Father    Hypertension Mother    Thyroid  disease Mother    Hearing loss Mother    Hyperlipidemia Mother    Intellectual disability Mother    Stroke Mother    Vision loss Mother    Cancer Other        GM--gallbladder   Lung cancer Other        GF   Colon cancer Other        GF   Osteoporosis Other        GM   Asthma Daughter    Breast cancer Neg Hx    Social History   Occupational History   Occupation: [employer]  Tobacco Use   Smoking status: Never   Smokeless tobacco: Never  Vaping Use   Vaping status: Never Used  Substance and Sexual Activity   Alcohol use: Not Currently    Comment: rarely   Drug use: Never   Sexual  activity: Yes    Birth control/protection: Post-menopausal, None    Comment: No longer needed   Tobacco Counseling Counseling given: Not Answered  SDOH Screenings   Food Insecurity: No Food Insecurity (04/19/2024)  Housing: Low Risk (04/19/2024)  Transportation Needs: No Transportation Needs (04/19/2024)  Utilities: Not At Risk (04/19/2024)  Alcohol Screen: Low Risk (04/17/2023)  Depression (PHQ2-9): Low Risk (04/19/2024)  Financial Resource Strain: Low Risk (04/19/2024)  Physical Activity: Sufficiently Active (04/19/2024)   Social Connections: Socially Integrated (04/19/2024)  Stress: No Stress Concern Present (04/19/2024)  Tobacco Use: Low Risk (04/19/2024)  Health Literacy: Adequate Health Literacy (04/19/2024)   See flowsheets for full screening details  Depression Screen PHQ 2 & 9 Depression Scale- Over the past 2 weeks, how often have you been bothered by any of the following problems? Little interest or pleasure in doing things: 0 Feeling down, depressed, or hopeless (PHQ Adolescent also includes...irritable): 0 PHQ-2 Total Score: 0 Trouble falling or staying asleep, or sleeping too much: 0 Feeling tired or having little energy: 0 Poor appetite or overeating (PHQ Adolescent also includes...weight loss): 0 Feeling bad about yourself - or that you are a failure or have let yourself or your family down: 0 Trouble concentrating on things, such as reading the newspaper or watching television (PHQ Adolescent also includes...like school work): 0 Moving or speaking so slowly that other people could have noticed. Or the opposite - being so fidgety or restless that you have been moving around a lot more than usual: 0 Thoughts that you would be better off dead, or of hurting yourself in some way: 0 PHQ-9 Total Score: 0 If you checked off any problems, how difficult have these problems made it for you to do your work, take care of things at home, or get along with other people?: Not difficult at all     Goals Addressed               This Visit's Progress     Patient Stated (pt-stated)   On track     04/17/23-Pt would like to Lose more weight              Objective:    Today's Vitals   04/19/24 1042  Weight: 161 lb (73 kg)  Height: 5' 5.5 (1.664 m)   Body mass index is 26.38 kg/m.  Hearing/Vision screen Vision Screening - Comments:: UTD w/visits to Dr Enola Immunizations and Health Maintenance Health Maintenance  Topic Date Due   Hepatitis C Screening  Never done   Medicare Annual  Wellness (AWV)  04/16/2024   DTaP/Tdap/Td (3 - Td or Tdap) 02/11/2025 (Originally 08/14/2021)   Zoster Vaccines- Shingrix (1 of 2) 05/14/2025 (Originally 08/19/1969)   COVID-19 Vaccine (3 - Pfizer risk series) 02/27/2026 (Originally 07/05/2019)   Mammogram  06/22/2024   Colonoscopy  02/10/2028   Pneumococcal Vaccine: 50+ Years  Completed   Influenza Vaccine  Completed   Bone Density Scan  Completed   Meningococcal B Vaccine  Aged Out        Assessment/Plan:  This is a routine wellness examination for [place].  Patient Care Team: [place], Laine LABOR, MD as PCP - General Enola, Feliciano Hugger, MD as Consulting Physician (Ophthalmology)  I have personally reviewed and noted the following in the patients chart:   Medical and social history Use of alcohol, tobacco or illicit drugs  Current medications and supplements including opioid prescriptions. Functional ability and status Nutritional status Physical activity Advanced directives List of other physicians Hospitalizations,  surgeries, and ER visits in previous 12 months Vitals Screenings to include cognitive, depression, and falls Referrals and appointments  No orders of the defined types were placed in this encounter.  In addition, I have reviewed and discussed with patient certain preventive protocols, quality metrics, and best practice recommendations. A written personalized care plan for preventive services as well as general preventive health recommendations were provided to patient.   Erminio LITTIE Saris, LPN   [id number]    After Visit Summary: (MyChart) Due to this being a telephonic visit, the after visit summary with patients personalized plan was offered to patient via MyChart   Nurse Notes: No voiced or noted concerns at this time Patient advised to keep follow-up appointment with PCP (Nov 2026 (CPE))  "

## 2024-04-19 NOTE — Patient Instructions (Signed)
 Ms. Katrina [name],  Thank you for taking the time for your Medicare Wellness Visit. I appreciate your continued commitment to your health goals. Please review the care plan we discussed, and feel free to reach out if I can assist you further.  Please note that Annual Wellness Visits do not include a physical exam. Some assessments may be limited, especially if the visit was conducted virtually. If needed, we may recommend an in-person follow-up with your provider.  Ongoing Care Seeing your primary care provider every 3 to 6 months helps us  monitor your health and provide consistent, personalized care.   Referrals If a referral was made during today's visit and you haven't received any updates within two weeks, please contact the referred provider directly to check on the status.  Recommended Screenings:  Health Maintenance  Topic Date Due   Hepatitis C Screening  Never done   Medicare Annual Wellness Visit  04/16/2024   DTaP/Tdap/Td vaccine (3 - Td or Tdap) 02/11/2025*   Zoster (Shingles) Vaccine (1 of 2) 05/14/2025*   COVID-19 Vaccine (3 - Pfizer risk series) 02/27/2026*   Breast Cancer Screening  06/22/2024   Colon Cancer Screening  02/10/2028   Pneumococcal Vaccine for age over 32  Completed   Flu Shot  Completed   Osteoporosis screening with Bone Density Scan  Completed   Meningitis B Vaccine  Aged Out  *Topic was postponed. The date shown is not the original due date.       04/15/2024   12:50 PM  Advanced Directives  Does Patient Have a Medical Advance Directive? No  Would patient like information on creating a medical advance directive? --    Vision: Annual vision screenings are recommended for early detection of glaucoma, cataracts, and diabetic retinopathy. These exams can also reveal signs of chronic conditions such as diabetes and high blood pressure.  Dental: Annual dental screenings help detect early signs of oral cancer, gum disease, and other conditions linked to overall  health, including heart disease and diabetes.  Please see the attached documents for additional preventive care recommendations.

## 2024-04-26 ENCOUNTER — Ambulatory Visit

## 2024-04-26 ENCOUNTER — Encounter: Admitting: Family Medicine

## 2024-05-02 ENCOUNTER — Other Ambulatory Visit: Payer: Self-pay | Admitting: Family Medicine

## 2024-05-02 DIAGNOSIS — E7800 Pure hypercholesterolemia, unspecified: Secondary | ICD-10-CM

## 2025-02-06 ENCOUNTER — Other Ambulatory Visit

## 2025-02-13 ENCOUNTER — Encounter: Admitting: Family Medicine

## 2025-02-20 ENCOUNTER — Encounter: Admitting: Dermatology
